# Patient Record
Sex: Male | Born: 1955 | Race: Black or African American | Marital: Married | Smoking: Never smoker
Health system: Southern US, Community
[De-identification: ages and names within clinical notes are randomized; demographics above are authoritative.]

## PROBLEM LIST (undated history)

## (undated) DIAGNOSIS — B0230 Zoster ocular disease, unspecified: Secondary | ICD-10-CM

## (undated) HISTORY — PX: CORONARY ANGIOPLASTY: SHX604

## (undated) HISTORY — PX: ARTERIOVENOUS GRAFT PLACEMENT: SUR1029

---

## 2020-05-19 ENCOUNTER — Inpatient Hospital Stay (HOSPITAL_COMMUNITY)
Admission: AD | Admit: 2020-05-19 | Discharge: 2020-05-30 | DRG: 871 | Disposition: A | Discharge status: Home / Self Care | Payer: Medicare Other | Source: Other Acute Inpatient Hospital | Attending: Internal Medicine | Admitting: Internal Medicine

## 2020-05-19 ENCOUNTER — Other Ambulatory Visit: Payer: Self-pay

## 2020-05-19 ENCOUNTER — Inpatient Hospital Stay (HOSPITAL_COMMUNITY): Payer: Medicare Other

## 2020-05-19 ENCOUNTER — Encounter (HOSPITAL_COMMUNITY): Payer: Self-pay | Admitting: Family Medicine

## 2020-05-19 DIAGNOSIS — J90 Pleural effusion, not elsewhere classified: Secondary | ICD-10-CM

## 2020-05-19 DIAGNOSIS — E1165 Type 2 diabetes mellitus with hyperglycemia: Secondary | ICD-10-CM | POA: Diagnosis not present

## 2020-05-19 DIAGNOSIS — I5083 High output heart failure: Secondary | ICD-10-CM | POA: Diagnosis present

## 2020-05-19 DIAGNOSIS — D62 Acute posthemorrhagic anemia: Secondary | ICD-10-CM | POA: Diagnosis present

## 2020-05-19 DIAGNOSIS — J449 Chronic obstructive pulmonary disease, unspecified: Secondary | ICD-10-CM | POA: Diagnosis not present

## 2020-05-19 DIAGNOSIS — R0602 Shortness of breath: Secondary | ICD-10-CM | POA: Diagnosis present

## 2020-05-19 DIAGNOSIS — Z992 Dependence on renal dialysis: Secondary | ICD-10-CM

## 2020-05-19 DIAGNOSIS — E1122 Type 2 diabetes mellitus with diabetic chronic kidney disease: Secondary | ICD-10-CM | POA: Diagnosis present

## 2020-05-19 DIAGNOSIS — R079 Chest pain, unspecified: Secondary | ICD-10-CM | POA: Diagnosis not present

## 2020-05-19 DIAGNOSIS — A419 Sepsis, unspecified organism: Secondary | ICD-10-CM | POA: Diagnosis present

## 2020-05-19 DIAGNOSIS — Z20822 Contact with and (suspected) exposure to covid-19: Secondary | ICD-10-CM | POA: Diagnosis present

## 2020-05-19 DIAGNOSIS — I2510 Atherosclerotic heart disease of native coronary artery without angina pectoris: Secondary | ICD-10-CM | POA: Diagnosis present

## 2020-05-19 DIAGNOSIS — J9601 Acute respiratory failure with hypoxia: Secondary | ICD-10-CM | POA: Diagnosis not present

## 2020-05-19 DIAGNOSIS — Z9981 Dependence on supplemental oxygen: Secondary | ICD-10-CM | POA: Diagnosis not present

## 2020-05-19 DIAGNOSIS — R042 Hemoptysis: Secondary | ICD-10-CM | POA: Diagnosis present

## 2020-05-19 DIAGNOSIS — R64 Cachexia: Secondary | ICD-10-CM | POA: Diagnosis present

## 2020-05-19 DIAGNOSIS — Z681 Body mass index (BMI) 19 or less, adult: Secondary | ICD-10-CM

## 2020-05-19 DIAGNOSIS — Z955 Presence of coronary angioplasty implant and graft: Secondary | ICD-10-CM

## 2020-05-19 DIAGNOSIS — J189 Pneumonia, unspecified organism: Secondary | ICD-10-CM

## 2020-05-19 DIAGNOSIS — I132 Hypertensive heart and chronic kidney disease with heart failure and with stage 5 chronic kidney disease, or end stage renal disease: Secondary | ICD-10-CM | POA: Diagnosis present

## 2020-05-19 DIAGNOSIS — I5022 Chronic systolic (congestive) heart failure: Secondary | ICD-10-CM | POA: Diagnosis present

## 2020-05-19 DIAGNOSIS — Z79899 Other long term (current) drug therapy: Secondary | ICD-10-CM

## 2020-05-19 DIAGNOSIS — Z885 Allergy status to narcotic agent status: Secondary | ICD-10-CM

## 2020-05-19 DIAGNOSIS — I25119 Atherosclerotic heart disease of native coronary artery with unspecified angina pectoris: Secondary | ICD-10-CM | POA: Diagnosis not present

## 2020-05-19 DIAGNOSIS — D649 Anemia, unspecified: Secondary | ICD-10-CM | POA: Diagnosis not present

## 2020-05-19 DIAGNOSIS — J440 Chronic obstructive pulmonary disease with acute lower respiratory infection: Secondary | ICD-10-CM | POA: Diagnosis present

## 2020-05-19 DIAGNOSIS — E43 Unspecified severe protein-calorie malnutrition: Secondary | ICD-10-CM | POA: Diagnosis present

## 2020-05-19 DIAGNOSIS — M069 Rheumatoid arthritis, unspecified: Secondary | ICD-10-CM | POA: Diagnosis present

## 2020-05-19 DIAGNOSIS — N186 End stage renal disease: Secondary | ICD-10-CM | POA: Diagnosis present

## 2020-05-19 DIAGNOSIS — D631 Anemia in chronic kidney disease: Secondary | ICD-10-CM | POA: Diagnosis present

## 2020-05-19 DIAGNOSIS — T380X5A Adverse effect of glucocorticoids and synthetic analogues, initial encounter: Secondary | ICD-10-CM | POA: Diagnosis not present

## 2020-05-19 DIAGNOSIS — J9621 Acute and chronic respiratory failure with hypoxia: Secondary | ICD-10-CM | POA: Diagnosis present

## 2020-05-19 DIAGNOSIS — B0230 Zoster ocular disease, unspecified: Secondary | ICD-10-CM | POA: Diagnosis present

## 2020-05-19 DIAGNOSIS — Z882 Allergy status to sulfonamides status: Secondary | ICD-10-CM

## 2020-05-19 DIAGNOSIS — Y95 Nosocomial condition: Secondary | ICD-10-CM | POA: Diagnosis not present

## 2020-05-19 DIAGNOSIS — R778 Other specified abnormalities of plasma proteins: Secondary | ICD-10-CM | POA: Diagnosis not present

## 2020-05-19 DIAGNOSIS — Z7982 Long term (current) use of aspirin: Secondary | ICD-10-CM

## 2020-05-19 DIAGNOSIS — G8929 Other chronic pain: Secondary | ICD-10-CM | POA: Diagnosis present

## 2020-05-19 DIAGNOSIS — R7989 Other specified abnormal findings of blood chemistry: Secondary | ICD-10-CM | POA: Diagnosis present

## 2020-05-19 DIAGNOSIS — Z23 Encounter for immunization: Secondary | ICD-10-CM | POA: Diagnosis present

## 2020-05-19 DIAGNOSIS — M898X9 Other specified disorders of bone, unspecified site: Secondary | ICD-10-CM | POA: Diagnosis present

## 2020-05-19 DIAGNOSIS — E876 Hypokalemia: Secondary | ICD-10-CM | POA: Diagnosis present

## 2020-05-19 DIAGNOSIS — Z7902 Long term (current) use of antithrombotics/antiplatelets: Secondary | ICD-10-CM

## 2020-05-19 HISTORY — DX: Chronic obstructive pulmonary disease, unspecified: J44.9

## 2020-05-19 HISTORY — DX: End stage renal disease: N18.6

## 2020-05-19 HISTORY — DX: Atherosclerotic heart disease of native coronary artery without angina pectoris: I25.10

## 2020-05-19 HISTORY — DX: Zoster ocular disease, unspecified: B02.30

## 2020-05-19 LAB — BASIC METABOLIC PANEL
Anion gap: 12 (ref 5–15)
BUN: 33 mg/dL — ABNORMAL HIGH (ref 8–23)
CO2: 31 mmol/L (ref 22–32)
Calcium: 8.1 mg/dL — ABNORMAL LOW (ref 8.9–10.3)
Chloride: 95 mmol/L — ABNORMAL LOW (ref 98–111)
Creatinine, Ser: 4.15 mg/dL — ABNORMAL HIGH (ref 0.61–1.24)
GFR, Estimated: 15 mL/min — ABNORMAL LOW (ref >60)
Glucose, Bld: 113 mg/dL — ABNORMAL HIGH (ref 70–99)
Potassium: 3.1 mmol/L — ABNORMAL LOW (ref 3.5–5.1)
Sodium: 138 mmol/L (ref 135–145)

## 2020-05-19 LAB — COMPREHENSIVE METABOLIC PANEL
ALT: 29 U/L (ref 0–44)
AST: 47 U/L — ABNORMAL HIGH (ref 15–41)
Albumin: 2.7 g/dL — ABNORMAL LOW (ref 3.5–5.0)
Alkaline Phosphatase: 121 U/L (ref 38–126)
Anion gap: 17 — ABNORMAL HIGH (ref 5–15)
BUN: 31 mg/dL — ABNORMAL HIGH (ref 8–23)
CO2: 26 mmol/L (ref 22–32)
Calcium: 7.9 mg/dL — ABNORMAL LOW (ref 8.9–10.3)
Chloride: 95 mmol/L — ABNORMAL LOW (ref 98–111)
Creatinine, Ser: 4.06 mg/dL — ABNORMAL HIGH (ref 0.61–1.24)
GFR, Estimated: 16 mL/min — ABNORMAL LOW (ref >60)
Glucose, Bld: 111 mg/dL — ABNORMAL HIGH (ref 70–99)
Potassium: 3.1 mmol/L — ABNORMAL LOW (ref 3.5–5.1)
Sodium: 138 mmol/L (ref 135–145)
Total Bilirubin: 0.9 mg/dL (ref 0.3–1.2)
Total Protein: 6.8 g/dL (ref 6.5–8.1)

## 2020-05-19 LAB — TROPONIN I (HIGH SENSITIVITY)
Troponin I (High Sensitivity): 160 ng/L (ref <18)
Troponin I (High Sensitivity): 174 ng/L (ref <18)
Troponin I (High Sensitivity): 188 ng/L (ref <18)

## 2020-05-19 LAB — PREPARE RBC (CROSSMATCH)

## 2020-05-19 LAB — URINALYSIS, ROUTINE W REFLEX MICROSCOPIC
Bilirubin Urine: NEGATIVE
Glucose, UA: 50 mg/dL — AB
Hgb urine dipstick: NEGATIVE
Ketones, ur: 5 mg/dL — AB
Leukocytes,Ua: NEGATIVE
Nitrite: NEGATIVE
Protein, ur: >=300 mg/dL — AB
Specific Gravity, Urine: 1.02 (ref 1.005–1.030)
pH: 6 (ref 5.0–8.0)

## 2020-05-19 LAB — CBC WITH DIFFERENTIAL/PLATELET
Abs Immature Granulocytes: 0.02 10*3/uL (ref 0.00–0.07)
Basophils Absolute: 0.1 10*3/uL (ref 0.0–0.1)
Basophils Relative: 1 %
Eosinophils Absolute: 0.2 10*3/uL (ref 0.0–0.5)
Eosinophils Relative: 2 %
HCT: 24.3 % — ABNORMAL LOW (ref 39.0–52.0)
Hemoglobin: 7.8 g/dL — ABNORMAL LOW (ref 13.0–17.0)
Immature Granulocytes: 0 %
Lymphocytes Relative: 11 %
Lymphs Abs: 0.8 10*3/uL (ref 0.7–4.0)
MCH: 29.4 pg (ref 26.0–34.0)
MCHC: 32.1 g/dL (ref 30.0–36.0)
MCV: 91.7 fL (ref 80.0–100.0)
Monocytes Absolute: 0.4 10*3/uL (ref 0.1–1.0)
Monocytes Relative: 5 %
Neutro Abs: 6.3 10*3/uL (ref 1.7–7.7)
Neutrophils Relative %: 81 %
Platelets: 212 10*3/uL (ref 150–400)
RBC: 2.65 MIL/uL — ABNORMAL LOW (ref 4.22–5.81)
RDW: 18.1 % — ABNORMAL HIGH (ref 11.5–15.5)
WBC: 7.7 10*3/uL (ref 4.0–10.5)
nRBC: 0.3 % — ABNORMAL HIGH (ref 0.0–0.2)

## 2020-05-19 LAB — EXPECTORATED SPUTUM ASSESSMENT W GRAM STAIN, RFLX TO RESP C

## 2020-05-19 LAB — TYPE AND SCREEN
ABO/RH(D): A POS
Antibody Screen: NEGATIVE

## 2020-05-19 LAB — CBC
HCT: 18.5 % — ABNORMAL LOW (ref 39.0–52.0)
Hemoglobin: 5.5 g/dL — CL (ref 13.0–17.0)
MCH: 28.2 pg (ref 26.0–34.0)
MCHC: 29.7 g/dL — ABNORMAL LOW (ref 30.0–36.0)
MCV: 94.9 fL (ref 80.0–100.0)
Platelets: 196 10*3/uL (ref 150–400)
RBC: 1.95 MIL/uL — ABNORMAL LOW (ref 4.22–5.81)
RDW: 19.2 % — ABNORMAL HIGH (ref 11.5–15.5)
WBC: 9.6 10*3/uL (ref 4.0–10.5)
nRBC: 0 % (ref 0.0–0.2)

## 2020-05-19 LAB — PROCALCITONIN
Procalcitonin: 23.01 ng/mL
Procalcitonin: 23.33 ng/mL

## 2020-05-19 LAB — VITAMIN B12: Vitamin B-12: 1242 pg/mL — ABNORMAL HIGH (ref 180–914)

## 2020-05-19 LAB — FOLATE: Folate: 20 ng/mL (ref >5.9)

## 2020-05-19 LAB — RETICULOCYTES
Immature Retic Fract: 25.9 % — ABNORMAL HIGH (ref 2.3–15.9)
RBC.: 2.99 MIL/uL — ABNORMAL LOW (ref 4.22–5.81)
Retic Count, Absolute: 159.1 10*3/uL (ref 19.0–186.0)
Retic Ct Pct: 5.3 % — ABNORMAL HIGH (ref 0.4–3.1)

## 2020-05-19 LAB — ABO/RH: ABO/RH(D): A POS

## 2020-05-19 LAB — D-DIMER, QUANTITATIVE: D-Dimer, Quant: 1.42 ug/mL-FEU — ABNORMAL HIGH (ref 0.00–0.50)

## 2020-05-19 LAB — FERRITIN: Ferritin: >7500 ng/mL — ABNORMAL HIGH (ref 24–336)

## 2020-05-19 LAB — MRSA PCR SCREENING: MRSA by PCR: NEGATIVE

## 2020-05-19 LAB — IRON AND TIBC
Iron: 34 ug/dL — ABNORMAL LOW (ref 45–182)
Saturation Ratios: 21 % (ref 17.9–39.5)
TIBC: 166 ug/dL — ABNORMAL LOW (ref 250–450)
UIBC: 132 ug/dL

## 2020-05-19 LAB — LACTIC ACID, PLASMA: Lactic Acid, Venous: 1.4 mmol/L (ref 0.5–1.9)

## 2020-05-19 LAB — HIV ANTIBODY (ROUTINE TESTING W REFLEX): HIV Screen 4th Generation wRfx: NONREACTIVE

## 2020-05-19 LAB — SARS CORONAVIRUS 2 (TAT 6-24 HRS): SARS Coronavirus 2: NEGATIVE

## 2020-05-19 LAB — MAGNESIUM: Magnesium: 1.9 mg/dL (ref 1.7–2.4)

## 2020-05-19 LAB — STREP PNEUMONIAE URINARY ANTIGEN: Strep Pneumo Urinary Antigen: NEGATIVE

## 2020-05-19 MED ORDER — FENTANYL CITRATE (PF) 100 MCG/2ML IJ SOLN: 12.5000 ug | INTRAMUSCULAR | Status: DC | PRN

## 2020-05-19 MED ORDER — CINACALCET HCL 30 MG PO TABS
60.0000 mg | ORAL_TABLET | ORAL | Status: DC
  Administered 2020-05-22 – 2020-05-29 (×4): 60 mg via ORAL
  Filled 2020-05-19 (×5): qty 2

## 2020-05-19 MED ORDER — ACETAMINOPHEN 650 MG RE SUPP: 650.0000 mg | Freq: Four times a day (QID) | RECTAL | Status: DC | PRN

## 2020-05-19 MED ORDER — FUROSEMIDE 10 MG/ML IJ SOLN
20.0000 mg | Freq: Once | INTRAMUSCULAR | Status: AC
  Administered 2020-05-19: 20 mg via INTRAVENOUS
  Filled 2020-05-19: qty 2

## 2020-05-19 MED ORDER — OXYCODONE-ACETAMINOPHEN 5-325 MG PO TABS
1.0000 | ORAL_TABLET | ORAL | Status: DC | PRN
  Administered 2020-05-19 – 2020-05-29 (×5): 1 via ORAL
  Filled 2020-05-19 (×5): qty 1

## 2020-05-19 MED ORDER — GUAIFENESIN-DM 100-10 MG/5ML PO SYRP
10.0000 mL | ORAL_SOLUTION | Freq: Four times a day (QID) | ORAL | Status: DC | PRN
  Administered 2020-05-19 – 2020-05-30 (×18): 10 mL via ORAL
  Filled 2020-05-19 (×18): qty 10

## 2020-05-19 MED ORDER — VANCOMYCIN HCL 1250 MG/250ML IV SOLN
1250.0000 mg | Freq: Once | INTRAVENOUS | Status: AC
  Administered 2020-05-19: 1250 mg via INTRAVENOUS
  Filled 2020-05-19: qty 250

## 2020-05-19 MED ORDER — MOMETASONE FURO-FORMOTEROL FUM 200-5 MCG/ACT IN AERO
2.0000 | INHALATION_SPRAY | Freq: Two times a day (BID) | RESPIRATORY_TRACT | Status: DC
  Administered 2020-05-19 – 2020-05-30 (×14): 2 via RESPIRATORY_TRACT
  Filled 2020-05-19 (×4): qty 8.8

## 2020-05-19 MED ORDER — SODIUM CHLORIDE 0.9% IV SOLUTION: Freq: Once | INTRAVENOUS | Status: DC

## 2020-05-19 MED ORDER — DOXERCALCIFEROL 4 MCG/2ML IV SOLN
2.0000 ug | INTRAVENOUS | Status: DC
  Administered 2020-05-20 – 2020-05-29 (×4): 2 ug via INTRAVENOUS
  Filled 2020-05-19 (×4): qty 2

## 2020-05-19 MED ORDER — FUROSEMIDE 10 MG/ML IJ SOLN
40.0000 mg | Freq: Once | INTRAMUSCULAR | Status: AC
  Administered 2020-05-19: 40 mg via INTRAVENOUS
  Filled 2020-05-19: qty 4

## 2020-05-19 MED ORDER — CHLORHEXIDINE GLUCONATE CLOTH 2 % EX PADS
6.0000 | MEDICATED_PAD | Freq: Every day | CUTANEOUS | Status: DC
  Administered 2020-05-20 – 2020-05-22 (×3): 6 via TOPICAL

## 2020-05-19 MED ORDER — ONDANSETRON HCL 4 MG/2ML IJ SOLN: 4.0000 mg | Freq: Four times a day (QID) | INTRAMUSCULAR | Status: DC | PRN

## 2020-05-19 MED ORDER — SODIUM CHLORIDE 0.9 % IV SOLN
2.0000 g | Freq: Once | INTRAVENOUS | Status: AC
  Administered 2020-05-19: 2 g via INTRAVENOUS
  Filled 2020-05-19: qty 2

## 2020-05-19 MED ORDER — ONDANSETRON HCL 4 MG PO TABS: 4.0000 mg | ORAL_TABLET | Freq: Four times a day (QID) | ORAL | Status: DC | PRN

## 2020-05-19 MED ORDER — TECHNETIUM TO 99M ALBUMIN AGGREGATED
4.4000 | Freq: Once | INTRAVENOUS | Status: AC
  Administered 2020-05-19: 4.4 via INTRAVENOUS

## 2020-05-19 MED ORDER — DARBEPOETIN ALFA 200 MCG/0.4ML IJ SOSY
200.0000 ug | PREFILLED_SYRINGE | INTRAMUSCULAR | Status: DC
  Administered 2020-05-20 – 2020-05-27 (×2): 200 ug via INTRAVENOUS
  Filled 2020-05-19: qty 0.4

## 2020-05-19 MED ORDER — SODIUM CHLORIDE 0.9 % IV SOLN
250.0000 mg | INTRAVENOUS | Status: AC
  Administered 2020-05-19 – 2020-05-22 (×4): 250 mg via INTRAVENOUS
  Filled 2020-05-19 (×5): qty 20

## 2020-05-19 MED ORDER — FUROSEMIDE 10 MG/ML IJ SOLN: 20.0000 mg | Freq: Once | INTRAMUSCULAR | Status: DC

## 2020-05-19 MED ORDER — ALBUTEROL SULFATE HFA 108 (90 BASE) MCG/ACT IN AERS
2.0000 | INHALATION_SPRAY | RESPIRATORY_TRACT | Status: DC | PRN
  Administered 2020-05-19 – 2020-05-20 (×2): 2 via RESPIRATORY_TRACT
  Filled 2020-05-19: qty 6.7

## 2020-05-19 MED ORDER — SODIUM CHLORIDE 0.9% IV SOLUTION: Freq: Once | INTRAVENOUS | Status: AC

## 2020-05-19 MED ORDER — SODIUM CHLORIDE 0.9 % IV SOLN
250.0000 mL | INTRAVENOUS | Status: DC | PRN
  Administered 2020-05-20 – 2020-05-22 (×2): 250 mL via INTRAVENOUS

## 2020-05-19 MED ORDER — TRAZODONE HCL 50 MG PO TABS
50.0000 mg | ORAL_TABLET | Freq: Every evening | ORAL | Status: DC | PRN
  Administered 2020-05-19 – 2020-05-27 (×7): 50 mg via ORAL
  Filled 2020-05-19 (×8): qty 1

## 2020-05-19 MED ORDER — ACETAMINOPHEN 325 MG PO TABS: 650.0000 mg | ORAL_TABLET | Freq: Four times a day (QID) | ORAL | Status: DC | PRN

## 2020-05-19 MED ORDER — VANCOMYCIN HCL 1000 MG/200ML IV SOLN: 1000.0000 mg | Freq: Once | INTRAVENOUS | Status: DC

## 2020-05-19 NOTE — Progress Notes (Signed)
Critical value 188 troponin called at received 1856 per call. Critical value paged to Dr. Tonie Griffith at [phone].

## 2020-05-19 NOTE — Progress Notes (Incomplete Revision)
PROGRESS NOTE   Brandon Chaney  XNA:[id number] DOB: Oct 13, 1955 DOA: 05/19/2020 PCP: No primary care provider on file.  Brief Narrative:  65 year old BM ESRD on HD-in [place]-proximal right upper extremity graft underwent hemodialysis in [place] yesterday 2 L for 3 and half hours usually has 1 L drawn off of him Dry weight is 61 kg COPD CAD + stents 02/2020 EF dropped from 45% to 25% recently Herpes zoster ophthalmicus Admit several weeks ago for 6 days at [facility] for -was discharged after getting 3 units of blood Then went to [facility] 3 days later and stayed there for 3 weeks with cough bloody expectoration despite antibiotic use Had pneumonia and PNA discharged on baseline 3 L  Had hemodialysis yesterday.  Posttreatment Felt very fatigued at the end of it Does not recall having endoscopy or colonoscopy at either hospital Does not report dark tarry stools bloody emesis etc. Bolused 500 cc IV fluid in ED--I/O today +627  Labs showed BUN/creatinine 33/4.1 Potassium 3.1 Iron 34 saturation was 21 ferritin above 7500 Procalcitonin 23  Hemoglobin 6.2 Patient developed shortness of breath after transfusion Which resolved significantly after administration of one-time dose Lasix  Hospital-Problem based course  Dyspnea DDx pneumonia/high output heart failure/moderate pleural effusion Recently discharged from [place] fear with Citrobacter and given 1 month of antibiotics projected end date at the time of that hospitalization was 05/04/2020 Continue cefepime vancomycin (procalcitonin 23) but do not give fluids given effusion at this time Had to stop transfusion as he was feeling winded Given Lasix X1 40 mg and improved significantly Patient stable on reassessment at 16:30 pm Pleural effusion on 1 view Chest x-ray 3/1 = moderate pleural effusion Placed order for IR --is transferring to [facility] IR thoracentesis-- Hemoptysis Underwent bronchoscopy at  [place] fear BAL culture = Citrobacter --Cont Cefdinir negative for malignancy negative for autoimmune disease-etiology felt to be?  DOAC Acute anemia likely combination of renal disease, unclear blood loss  Hemoccult stool Giving half a bag of blood given shortness of breath earlier today ESRD MWF right upper extremity [place] Dry weight 61, recent dialysis 2 L 2/28 Defer to nephrology rest CAD status post stent 02/2020 drop in EF from 45 to 25% Will need nonemergent cardiology evaluation for feasibility of intervention although probably need to be more stable prior to that Pls call cardiology in am 3/2 post transfusion and thoracentesis Chronic pain Home meds Percocet 1 tab every 4 as needed COPD Herpes ophthalmicus  DVT prophylaxis: scd Code Status: FULL Family Communication: wife fully updated at the bedside-phone [phone] I have explained to both of them in no uncertain terms that he is relatively high risk for getting sicker-explained that he will need a thoracentesis and that he is probably not a great candidate for any type of cardiac intervention at this time until we can get him better They understand all this-he remains full code Disposition:  Status is: Inpatient  Remains inpatient appropriate because:Hemodynamically unstable, Altered mental status, Ongoing diagnostic testing needed not appropriate for outpatient work up, Unsafe d/c plan, IV treatments appropriate due to intensity of illness or inability to take PO and Inpatient level of care appropriate due to severity of illness   Dispo: The patient is from: Home              Anticipated d/c is to: tbd              Patient currently is not medically stable to d/c.   Difficult  to place patient No  Consultants:   Nephrology   IR  Cardiology  Procedures: IR thoracentesis  Antimicrobials: Vanc Cefepime    Subjective: Some Mild CP after transufion earlier today relieved with Lasix CXR showed effusion on  R side He was reviewed again 4:14 PM and is stable without chest pain arm pain or any other issues at this time Reiterated with him/wife next steps     Objective: Vitals:   05/19/20 0923 05/19/20 0942 05/19/20 1015 05/19/20 1315  BP: (!) 123/93 (!) 124/41 (!) 127/38 (!) 122/58  Pulse: 80 81  82  Resp: 18 20  (!) 22  Temp: 98.3 F (36.8 C) 98.4 F (36.9 C)  98.1 F (36.7 C)  TempSrc: Oral Oral  Oral  SpO2:      Weight:      Height:        Intake/Output Summary (Last 24 hours) at 05/19/2020 1414 Last data filed at 05/19/2020 1305 Gross per 24 hour  Intake 763.76 ml  Output -  Net 763.76 ml   Filed Weights   05/19/20 0145  Weight: 61.9 kg    Examination:  Awake coherent pleasant on 2 L of oxygen Quite asthenic S1-S2 no murmur no rub no gallop Abdomen soft no murmur EOMI NCAT no focal deficit Neurologically intact TMA left foot, right upper arm AV graft good bruit  Data Reviewed: personally reviewed   CBC    Component Value Date/Time   WBC 9.6 05/19/2020 0510   RBC 2.99 (L) 05/19/2020 0517   RBC 1.95 (L) 05/19/2020 0510   HGB 5.5 (LL) 05/19/2020 0510   HCT 18.5 (L) 05/19/2020 0510   PLT 196 05/19/2020 0510   MCV 94.9 05/19/2020 0510   MCH 28.2 05/19/2020 0510   MCHC 29.7 (L) 05/19/2020 0510   RDW 19.2 (H) 05/19/2020 0510   CMP Latest Ref Rng & Units 05/19/2020 05/19/2020  Glucose 70 - 99 mg/dL 113(H) 111(H)  BUN 8 - 23 mg/dL 33(H) 31(H)  Creatinine 0.61 - 1.24 mg/dL 4.15(H) 4.06(H)  Sodium 135 - 145 mmol/L 138 138  Potassium 3.5 - 5.1 mmol/L 3.1(L) 3.1(L)  Chloride 98 - 111 mmol/L 95(L) 95(L)  CO2 22 - 32 mmol/L 31 26  Calcium 8.9 - 10.3 mg/dL 8.1(L) 7.9(L)  Total Protein 6.5 - 8.1 g/dL - 6.8  Total Bilirubin 0.3 - 1.2 mg/dL - 0.9  Alkaline Phos 38 - 126 U/L - 121  AST 15 - 41 U/L - 47(H)  ALT 0 - 44 U/L - 29     Radiology Studies: DG CHEST PORT 1 VIEW  Result Date: 05/19/2020 CLINICAL DATA:  Pneumonia. EXAM: PORTABLE CHEST 1 VIEW COMPARISON:   One-view chest x-ray 05/19/2020 FINDINGS: Heart is enlarged. Atherosclerotic calcifications are present. Diffuse interstitial pattern is again seen. Moderate right pleural effusion present. No pneumothorax. Bibasilar airspace disease is worse right than left. IMPRESSION: 1. Cardiomegaly with diffuse interstitial pattern compatible with edema. Infection is not excluded. Patchy opacities seen on the prior study are less prominent. Pattern is more diffuse. 2. Moderate right pleural effusion. 3. Bibasilar airspace disease is worse on the right than left. Electronically Signed   By: San Morelle M.D.   On: 05/19/2020 10:47   DG CHEST PORT 1 VIEW  Result Date: 05/19/2020 CLINICAL DATA:  Sepsis, pneumonia, hemoptysis EXAM: PORTABLE CHEST 1 VIEW COMPARISON:  Portable exam 0515 hours without priors for comparison FINDINGS: Normal heart size and mediastinal contours. BILATERAL pulmonary infiltrates favor multifocal pneumonia, less likely edema Probable small RIGHT  pleural effusion. No pneumothorax or acute osseous findings. Vascular stents at the axilla bilaterally. IMPRESSION: Diffuse BILATERAL pulmonary infiltrates favoring multifocal pneumonia. Electronically Signed   By: Lavonia Dana M.D.   On: 05/19/2020 08:22     Scheduled Meds: . mometasone-formoterol  2 puff Inhalation BID   Continuous Infusions: . sodium chloride       LOS: 0 days   Time spent: 95 minutes 60 in direct care time and care coordination  Nita Sells, MD [facility] To contact the attending provider between 7A-7P or the covering provider during after hours 7P-7A, please log into the web site [email or web address] and access using universal [place] password for that web site. If you do not have the password, please call the hospital operator.  05/19/2020, 2:14 PM

## 2020-05-19 NOTE — H&P (Signed)
History and Physical    Brandon Chaney [id number] DOB: 1955-07-20 DOA: 05/19/2020  PCP: No primary care provider on file.   Patient coming from: Home   Chief Complaint: Chest pain, SOB, cough   HPI: Brandon Chaney is a 65 y.o. male with medical history significant for ESRD on hemodialysis, COPD, coronary artery disease with stents, history of zoster ophthalmicus, recent hospital admission for pneumonia and symptomatic anemia, and supplemental oxygen requirement since the recent hospitalization, representing to the ED for evaluation of chest pain, shortness of breath, and persistent cough.  Patient reports that he was admitted to [facility] roughly 2 weeks ago, treated for pneumonia, transfused 3 units of RBC, and went home with supplemental oxygen.  He continued to feel poorly, has had ongoing productive cough with sputum and small blood clots since prior to the hospitalization, and then developed chest pain and worsening shortness of breath and general malaise and fatigue over the past 2 days.  He has not noted any melena or hematochezia.  He is not sure why he required the transfusion.  Reports that he completed dialysis on 05/18/2020.  [facility] ED Course: Upon arrival to the ED, patient is found to be febrile 102.8 F, saturating 87% on 2 L/min of supplemental oxygen, tachypneic in the mid 20s, and with blood pressure 93/40.  EKG features sinus tachycardia with nonspecific IVCD and borderline ST depressions in the lateral leads.  Chest x-ray was read as bilateral upper lobe infiltrates with right greater than left effusions.  Chemistry panel features a potassium of 3.1, bicarbonate 25, and creatinine 3.20.  CBC notable for leukocytosis to 11,000 and a normocytic anemia with hemoglobin 6.2.  Lactic acid is 2.2 and procalcitonin 1.49.  Hs-CRP elevated 49.88 and high-sensitivity troponin 113.  INR was 1.3.  Covid and influenza PCR was negative.  Blood cultures were collected in the  emergency department and the patient was treated with 500 cc LR, aspirin, Rocephin, acetaminophen, and vancomycin.  He was transferred to [facility] for [facility].  Review of Systems:  All other systems reviewed and apart from HPI, are negative.  Past Medical History:  Diagnosis Date  . CAD (coronary artery disease) 05/19/2020  . COPD (chronic obstructive pulmonary disease) ([place]) 05/19/2020  . ESRD (end stage renal disease) ([place]) 05/19/2020  . Herpes zoster ophthalmicus, right eye     Past Surgical History:  Procedure Laterality Date  . ARTERIOVENOUS GRAFT PLACEMENT Right   . CORONARY ANGIOPLASTY      Social History:   reports that he has never smoked. He has never used smokeless tobacco. He reports previous alcohol use. He reports previous drug use.  Not on File  Family History  Family history unknown: Yes     Prior to Admission medications   Not on File    Physical Exam: Vitals:   05/19/20 0145  BP: (!) 112/32  Pulse: 79  Resp: 20  Temp: 98.1 F (36.7 C)  TempSrc: Oral  SpO2: 90%  Weight: 61.9 kg  Height: '5\' 9"'$  (1.753 m)    Constitutional: NAD, calm, cachectic   Eyes: PERTLA, lids and conjunctivae normal ENMT: Mucous membranes are moist. Posterior pharynx clear of any exudate or lesions.   Neck: normal, supple, no masses, no thyromegaly Respiratory: coarse rales, no wheezing. No accessory muscle use.  Cardiovascular: S1 & S2 heard, regular rate and rhythm. No extremity edema. Graft in proximal RUE.  Abdomen: No distension, no tenderness, soft. Bowel sounds active.  Musculoskeletal: no clubbing /  cyanosis. No joint deformity upper and lower extremities.   Skin: no significant rashes, lesions, ulcers. Warm, dry, well-perfused. Neurologic: No gross facial asymmetry. Sensation intact. Moving all extremities.  Psychiatric: Alert and oriented to person, place, and situation. Calm and cooperative.    Labs and Imaging on Admission: I have personally reviewed  following labs and imaging studies  CBC: No results for input(s): WBC, NEUTROABS, HGB, HCT, MCV, PLT in the last 168 hours. Basic Metabolic Panel: No results for input(s): NA, K, CL, CO2, GLUCOSE, BUN, CREATININE, CALCIUM, MG, PHOS in the last 168 hours. GFR: CrCl cannot be calculated (No successful lab value found.). Liver Function Tests: No results for input(s): AST, ALT, ALKPHOS, BILITOT, PROT, ALBUMIN in the last 168 hours. No results for input(s): LIPASE, AMYLASE in the last 168 hours. No results for input(s): AMMONIA in the last 168 hours. Coagulation Profile: No results for input(s): INR, PROTIME in the last 168 hours. Cardiac Enzymes: No results for input(s): CKTOTAL, CKMB, CKMBINDEX, TROPONINI in the last 168 hours. BNP (last 3 results) No results for input(s): PROBNP in the last 8760 hours. HbA1C: No results for input(s): HGBA1C in the last 72 hours. CBG: No results for input(s): GLUCAP in the last 168 hours. Lipid Profile: No results for input(s): CHOL, HDL, LDLCALC, TRIG, CHOLHDL, LDLDIRECT in the last 72 hours. Thyroid Function Tests: No results for input(s): TSH, T4TOTAL, FREET4, T3FREE, THYROIDAB in the last 72 hours. Anemia Panel: No results for input(s): VITAMINB12, FOLATE, FERRITIN, TIBC, IRON, RETICCTPCT in the last 72 hours. Urine analysis: No results found for: COLORURINE, APPEARANCEUR, LABSPEC, PHURINE, GLUCOSEU, HGBUR, BILIRUBINUR, KETONESUR, PROTEINUR, UROBILINOGEN, NITRITE, LEUKOCYTESUR Sepsis Labs: '@LABRCNTIP'$ (procalcitonin:4,lacticidven:4) )No results found for this or any previous visit (from the past 240 hour(s)).   Radiological Exams on Admission: No results found.  EKG: Independently reviewed. Sinus tachycardia, rate 100, non-specific IVCD, borderline ST-depressions laterally.   Assessment/Plan   1. Sepsis due to PNA; acute on chronic hypoxic respiratory failure  - Presents with CP, worsening SOB, and persistent productive cough after admission  for PNA ~2 weeks ago and is found to be febrile, tachypneic, and saturating 87% on 2 Lpm, with lactic acid 2.2 and procalcitonin 1.49  - Unable to view CXR from ED but read as bilateral upper lobe infiltrates and right > left effusions  - He was cultured and treated with Rocephin and vancomycin in ED  - Obtain records from recent admission, culture blood and sputum, check strep pneumo and legionella antigens, trend procalcitonin, and continue broad-spectrum antibiotics   2. Symptomatic anemia  - Presents with SOB and chest pain and is found to have Hgb of 6.2 with no prior labs available for comparison  - He reports scant hemoptysis, has not noted melena or hematochezia, and reports that he was transfused with 3 units RBC during recent admission  - Obtain records from recent admission, type & screen, check anemia panel and FOBT, and transfuse 1 unit RBC    3. Chest pain; elevated troponin  - Patient reports constant chest pressure for the past 2 days and had HS troponin of 113 in ED  - He reports history of CAD with stents but has not had this type of pain before  - EKG with borderline ST-depressions in lateral leads  - He was treated with ASA in ED  - Check d-dimer given hemoptysis, transfuse RBCs, trend troponin, repeat EKG, continue cardiac monitoring, hold off on anticoagulation as this is likely type II event and he has hemoptysis and Hgb  6.2    4. Hemoptysis  - Patient reports a few weeks of scant hemoptysis  - Unable to view CXR from outside hospital and records from recent hospitalization not yet available  - Plan to obtain records, check d-dimer, and treat suspected pneumonia for now    5. ESRD  - Patient has graft in proximal RUE, reports receiving HD on 05/18/20  - No indications for urgent HD but he was given 500 cc LR in ED and will need blood transfusion  - Renally-dose medications, restrict fluids, contact nephrology in am for HD    6. COPD  - No wheezing on admission  -  ICS/LABA, as-needed albuterol    7. Hypokalemia  - Serum potassium was 3.1 at outside hospital  - Repeat chem panel pending now, will treat cautiously in light of ESRD     DVT prophylaxis: SCDs  Code Status: Full  Level of Care: Level of care: Progressive Family Communication: Patient asked that we not wake his wife now  Disposition Plan:  Patient is from: Home   Anticipated d/c is to: TBD Anticipated d/c date is: 05/22/20 Patient currently: Requiring inpatient treatment of PNA with high PORT score, further workup of chest pain and hemoptysis  Consults called: None  Admission status: Inpatient     Vianne Bulls, MD [facility]  05/19/2020, 4:15 AM

## 2020-05-19 NOTE — Plan of Care (Signed)
  Problem: Education: Goal: Knowledge of General Education information will improve Description: Including pain rating scale, medication(s)/side effects and non-pharmacologic comfort measures Outcome: Progressing   Problem: Clinical Measurements: Goal: Diagnostic test results will improve Outcome: Progressing Goal: Respiratory complications will improve Outcome: Progressing   Problem: Activity: Goal: Risk for activity intolerance will decrease Outcome: Progressing   Problem: Nutrition: Goal: Adequate nutrition will be maintained Outcome: Progressing   Problem: Safety: Goal: Ability to remain free from injury will improve Outcome: Progressing   Problem: Fluid Volume: Goal: Compliance with measures to maintain balanced fluid volume will improve Outcome: Progressing

## 2020-05-19 NOTE — Hospital Course (Addendum)
  Impression:            - Hemoptysis with abnormal CXR                        - The airway examination was normal.                        - Bronchoalveolar lavage was performed.                        - The airway examination was normal.   Pt was seen for bedside swallow evaluation. Pt reported that he intermittently gets a "tickle" in his throat during meals which causes coughing. Pt further stated that he was to have his esophagus dilated ~6 years prior, but this was not done due to his medical status as the time. He was unsure as to whether he sensed aspiration but indicated that some foods "wont go down" and pointed the pharynx as the site of the sensation. Oral mechanism exam was [facility] and pt was edentulous. Pt's oral phase was WNL despite edentulous status. He exhibited delayed coughing between trials of regular texture solids. Coughing was also noted at baseline and the possibility of coughing being unrelated is considered. However, considering pt's reports, and the results of imaging, a modified barium swallow study is recommended to further assess physiology and to rule out aspiration.

## 2020-05-19 NOTE — Progress Notes (Signed)
Date and time results received: 05/19/20  0600H  Critical Value:Hemoglobin-5.5   Name of Provider Notified: Jonny Ruiz NP  Time Notified: [id number]  Orders Received? Or Actions Taken?: Paged/Made aware/Awaiting orders if any.

## 2020-05-19 NOTE — Plan of Care (Signed)
  Problem: Clinical Measurements: Goal: Complications related to the disease process, condition or treatment will be avoided or minimized Outcome: Progressing   Problem: Fluid Volume: Goal: Compliance with measures to maintain balanced fluid volume will improve Outcome: Progressing   Problem: Pain Managment: Goal: General experience of comfort will improve Outcome: Progressing   Problem: Activity: Goal: Risk for activity intolerance will decrease Outcome: Progressing

## 2020-05-19 NOTE — Progress Notes (Addendum)
PROGRESS NOTE   Brandon Chaney  SFK:[id number] DOB: 1955/06/07 DOA: 05/19/2020 PCP: No primary care provider on file.  Brief Narrative:  65 year old BM ESRD on HD-in [place]-proximal right upper extremity graft underwent hemodialysis in [place] yesterday 2 L for 3 and half hours usually has 1 L drawn off of him Dry weight is 61 kg COPD CAD + stents 02/2020 EF dropped from 45% to 25% recently Herpes zoster ophthalmicus Admit several weeks ago for 6 days at [facility] for -was discharged after getting 3 units of blood Then went to [facility] 3 days later and stayed there for 3 weeks with cough bloody expectoration despite antibiotic use Had pneumonia and PNA discharged on baseline 3 L  Had hemodialysis yesterday.  Posttreatment Felt very fatigued at the end of it Does not recall having endoscopy or colonoscopy at either hospital Does not report dark tarry stools bloody emesis etc. Bolused 500 cc IV fluid in ED--I/O today +627  Labs showed BUN/creatinine 33/4.1 Potassium 3.1 Iron 34 saturation was 21 ferritin above 7500 Procalcitonin 23  Hemoglobin 6.2 Patient developed shortness of breath after transfusion  I have tried X.3 to get records and nurse tech on 4 E has emailed [facility]-Problem based course  Dyspnea Multifactorial DDx pneumonia/high output heart failure Recently discharged from [place] [facility] with Citrobacter and given 1 month of antibiotics projected end date at the time of that hospitalization was 05/04/2020 Continue cefepime vancomycin (procalcitonin 23) but do not give fluids given effusion at this time Had to stop transfusion as he was feeling winded Given Lasix X1 40 mg and improved significantly Patient stable on reassessment at 16:30 pm Pleural effusion 1 view Chest x-ray 3/1 = moderate pleural effusion Placed order for IR and informed him of his transfer to [facility] thoracentesis-care order placed for nursing to call IR  at [place] if gets there first Hemoptysis Underwent bronchoscopy at [place] [facility] BAL culture = Citrobacter negative for malignancy negative for autoimmune disease-etiology felt to be?  DOAC Acute anemia likely combination of renal disease, unclear blood loss  Hemoccult stool Giving half a bag of blood given shortness of breath earlier today ESRD MWF right upper extremity [place] Dry weight 61, recent dialysis 2 L 2/28 Defer to nephrology rest CAD status post stent 02/2020 drop in EF from 45 to 25% Will need nonemergent cardiology evaluation for feasibility of intervention although probably need to be more stable prior to that Pls call cardiology in am 3/2 post transfusion and thoracentesis Chronic pain Home meds Percocet 1 tab every 4 as needed COPD Herpes ophthalmicus  DVT prophylaxis: scd Code Status: FULL Family Communication: wife fully updated at the bedside-phone [phone] I have explained to both of them in no uncertain terms that he is relatively high risk for getting sicker-explained that he will need a thoracentesis and that he is probably not a great candidate for any type of cardiac intervention at this time until we can get him better They understand all this-he remains full code Disposition:  Status is: Inpatient  Remains inpatient appropriate because:Hemodynamically unstable, Altered mental status, Ongoing diagnostic testing needed not appropriate for outpatient work up, Unsafe d/c plan, IV treatments appropriate due to intensity of illness or inability to take PO and Inpatient level of care appropriate due to severity of illness   Dispo: The patient is from: Home              Anticipated d/c is to: tbd  Patient currently is not medically stable to d/c.   Difficult to place patient No  Consultants:   Nephrology   IR  Cardiology  Procedures: IR thoracentesis  Antimicrobials: Vanc Cefepime    Subjective: Some Mild CP after transufion earlier  today relieved with Lasix CXR showed effusion on R side He was reviewed again 4:14 PM and is stable without chest pain arm pain or any other issues at this time Reiterated with him/wife next steps     Objective: Vitals:   05/19/20 0923 05/19/20 0942 05/19/20 1015 05/19/20 1315  BP: (!) 123/93 (!) 124/41 (!) 127/38 (!) 122/58  Pulse: 80 81  82  Resp: 18 20  (!) 22  Temp: 98.3 F (36.8 C) 98.4 F (36.9 C)  98.1 F (36.7 C)  TempSrc: Oral Oral  Oral  SpO2:      Weight:      Height:        Intake/Output Summary (Last 24 hours) at 05/19/2020 1414 Last data filed at 05/19/2020 1305 Gross per 24 hour  Intake 763.76 ml  Output --  Net 763.76 ml   Filed Weights   05/19/20 0145  Weight: 61.9 kg    Examination:  Awake coherent pleasant on 2 L of oxygen Quite asthenic S1-S2 no murmur no rub no gallop Abdomen soft no murmur EOMI NCAT no focal deficit Neurologically intact TMA left foot, right upper arm AV graft good bruit  Data Reviewed: personally reviewed   CBC    Component Value Date/Time   WBC 9.6 05/19/2020 0510   RBC 2.99 (L) 05/19/2020 0517   RBC 1.95 (L) 05/19/2020 0510   HGB 5.5 (LL) 05/19/2020 0510   HCT 18.5 (L) 05/19/2020 0510   PLT 196 05/19/2020 0510   MCV 94.9 05/19/2020 0510   MCH 28.2 05/19/2020 0510   MCHC 29.7 (L) 05/19/2020 0510   RDW 19.2 (H) 05/19/2020 0510   CMP Latest Ref Rng & Units 05/19/2020 05/19/2020  Glucose 70 - 99 mg/dL 113(H) 111(H)  BUN 8 - 23 mg/dL 33(H) 31(H)  Creatinine 0.61 - 1.24 mg/dL 4.15(H) 4.06(H)  Sodium 135 - 145 mmol/L 138 138  Potassium 3.5 - 5.1 mmol/L 3.1(L) 3.1(L)  Chloride 98 - 111 mmol/L 95(L) 95(L)  CO2 22 - 32 mmol/L 31 26  Calcium 8.9 - 10.3 mg/dL 8.1(L) 7.9(L)  Total Protein 6.5 - 8.1 g/dL - 6.8  Total Bilirubin 0.3 - 1.2 mg/dL - 0.9  Alkaline Phos 38 - 126 U/L - 121  AST 15 - 41 U/L - 47(H)  ALT 0 - 44 U/L - 29     Radiology Studies: DG CHEST PORT 1 VIEW  Result Date: 05/19/2020 CLINICAL DATA:   Pneumonia. EXAM: PORTABLE CHEST 1 VIEW COMPARISON:  One-view chest x-ray 05/19/2020 FINDINGS: Heart is enlarged. Atherosclerotic calcifications are present. Diffuse interstitial pattern is again seen. Moderate right pleural effusion present. No pneumothorax. Bibasilar airspace disease is worse right than left. IMPRESSION: 1. Cardiomegaly with diffuse interstitial pattern compatible with edema. Infection is not excluded. Patchy opacities seen on the prior study are less prominent. Pattern is more diffuse. 2. Moderate right pleural effusion. 3. Bibasilar airspace disease is worse on the right than left. Electronically Signed   By: San Morelle M.D.   On: 05/19/2020 10:47   DG CHEST PORT 1 VIEW  Result Date: 05/19/2020 CLINICAL DATA:  Sepsis, pneumonia, hemoptysis EXAM: PORTABLE CHEST 1 VIEW COMPARISON:  Portable exam 0515 hours without priors for comparison FINDINGS: Normal heart size and mediastinal contours. BILATERAL  pulmonary infiltrates favor multifocal pneumonia, less likely edema Probable small RIGHT pleural effusion. No pneumothorax or acute osseous findings. Vascular stents at the axilla bilaterally. IMPRESSION: Diffuse BILATERAL pulmonary infiltrates favoring multifocal pneumonia. Electronically Signed   By: Lavonia Dana M.D.   On: 05/19/2020 08:22     Scheduled Meds: . mometasone-formoterol  2 puff Inhalation BID   Continuous Infusions: . sodium chloride       LOS: 0 days   Time spent: 95 minutes 60 in direct care time and care coordination  Nita Sells, MD [facility] To contact the attending provider between 7A-7P or the covering provider during after hours 7P-7A, please log into the web site [email or web address] and access using universal [place] password for that web site. If you do not have the password, please call the hospital operator.  05/19/2020, 2:14 PM

## 2020-05-19 NOTE — Progress Notes (Signed)
Pharmacy Antibiotic Note  Brandon Chaney is a 65 y.o. male admitted on 05/19/2020 with pneumonia.  PMH significant for ESRD on HD, COPD, CAD, recent hospitalization for pneumonia.  Patient transferred from [facility] center, where H&P states patient received Ceftriaxone, Azithromycin and Vancomycin.  Spoke with current RN who confirmed Vancomycin was not given at outside facility (medication was sent with patient but not yet administered).  Last HD noted on 05/18/20.  Pharmacy has been consulted for Vancomycin and Cefepime dosing.  Plan:  Cefepime 2gm IV x 1  Vancomycin '1250mg'$  iV x 1  SCr pending  F/U culture results and sensitivities  F/u plans for HD and appropriate redosing of antibiotics  Height: '5\' 9"'$  (175.3 cm) Weight: 61.9 kg (136 lb 7.4 oz) IBW/kg (Calculated) : 70.7  Temp (24hrs), Avg:98.1 F (36.7 C), Min:98.1 F (36.7 C), Max:98.1 F (36.7 C)  No results for input(s): WBC, CREATININE, LATICACIDVEN, VANCOTROUGH, VANCOPEAK, VANCORANDOM, GENTTROUGH, GENTPEAK, GENTRANDOM, TOBRATROUGH, TOBRAPEAK, TOBRARND, AMIKACINPEAK, AMIKACINTROU, AMIKACIN in the last 168 hours.  CrCl cannot be calculated (No successful lab value found.).    Not on File  Antimicrobials this admission: 3/1 Cefepime >>   3/1 Vancomycin >>    Dose adjustments this admission:    Microbiology results: 3/1 BCx:   3/1 UCx:    3/1 Sputum:      Thank you for allowing pharmacy to be a part of this patients care.  Everette Rank, PharmD 05/19/2020 4:49 AM

## 2020-05-19 NOTE — Progress Notes (Signed)
Patient seen and evaluated earlier today Does still pass urine-not oliguric Given 1 dose of Lasix and dramatically better with no shortness of breath conversant talking to me and coherent Getting half unit of blood now Stable for progressive care

## 2020-05-19 NOTE — Consult Note (Signed)
Reason for Consult: To manage dialysis and dialysis related needs Referring Physician: Abhik Chaney is an 65 y.o. male with past medical history significant for COPD, coronary artery disease as well as ESRD-receives dialysis at [name] has been on dialysis for 5 years.  Patient states that prior to a hospitalization about a month ago he was in great health and very active.  Last month at [place] [facility] had a hospitalization for pneumonia.  After that hospitalization has required oxygen.  He also continues to be short of breath and has a productive cough.  Since he was not any better he decided to come here for second opinion.  After discharge from the hospital he returned to his dialysis unit.  He received full dialysis treatment yesterday.  He reports feeling slightly poorly at the end so that does indicate that likely fluid was not left on him.  Evaluation here found him to be febrile and hypoxic-chest x-ray indicated bilateral upper lobe infiltrates with also bilateral effusions-white blood count 11,000 and hemoglobin 5.5.  Covid negative.  He has just returned from a VQ scan-no results yet.  Able to talk in full sentences   Dialyzes at [facility]-  mwf  - 3 hours and 30 minutes EDW 61.5. HD [place] unclear, Dialyzer 180, Heparin no. Access left AVG. sensipar 40 q HD, hectorol 2 q tx Last hgb 6.9 on 2/21  Past Medical History:  Diagnosis Date  . CAD (coronary artery disease) 05/19/2020  . COPD (chronic obstructive pulmonary disease) ([place]) 05/19/2020  . ESRD (end stage renal disease) ([place]) 05/19/2020  . Herpes zoster ophthalmicus, right eye     Past Surgical History:  Procedure Laterality Date  . ARTERIOVENOUS GRAFT PLACEMENT Right   . CORONARY ANGIOPLASTY      Family History  Family history unknown: Yes    Social History:  reports that he has never smoked. He has never used smokeless tobacco. He reports previous alcohol use. He reports  previous drug use.  Allergies:  Allergies  Allergen Reactions  . Duragesic-100 [Fentanyl] Nausea Only  . Sulfa Antibiotics     Heart flutters/itching    Medications: I have reviewed the patient's current medications.   Results for orders placed or performed during the hospital encounter of 05/19/20 (from the past 48 hour(s))  HIV Antibody (routine testing w rflx)     Status: None   Collection Time: 05/19/20  5:10 AM  Result Value Ref Range   HIV Screen 4th Generation wRfx Non Reactive Non Reactive    Comment: Performed at [facility], [address], [place], [place] [address]  Culture, blood (x 2)     Status: None (Preliminary result)   Collection Time: 05/19/20  5:10 AM   Specimen: BLOOD LEFT HAND  Result Value Ref Range   Specimen Description      BLOOD LEFT HAND Performed at [facility] [address] [place], [place] [address]    Special Requests      BOTTLES DRAWN AEROBIC ONLY Blood Culture adequate volume Performed at [facility] [address], [place], [place] [address]    Culture      NO GROWTH < 12 HOURS Performed at [facility] [address] [place], [place] [address]    Report Status PENDING   Lactic acid, plasma     Status: None   Collection Time: 05/19/20  5:10 AM  Result Value Ref Range   Lactic  Acid, Venous 1.4 0.5 - 1.9 mmol/L    Comment: Performed at [facility], [place] [address], [place], [place] [address]  Comprehensive metabolic panel     Status: Abnormal   Collection Time: 05/19/20  5:10 AM  Result Value Ref Range   Sodium 138 135 - 145 mmol/L   Potassium 3.1 (L) 3.5 - 5.1 mmol/L   Chloride 95 (L) 98 - 111 mmol/L   CO2 26 22 - 32 mmol/L   Glucose, Bld 111 (H) 70 - 99 mg/dL    Comment: Glucose reference range applies only to samples taken after fasting for at least 8 hours.   BUN 31 (H) 8 - 23 mg/dL   Creatinine, Ser 4.06 (H) 0.61 - 1.24 mg/dL   Calcium 7.9 (L) 8.9 -  10.3 mg/dL   Total Protein 6.8 6.5 - 8.1 g/dL   Albumin 2.7 (L) 3.5 - 5.0 g/dL   AST 47 (H) 15 - 41 U/L   ALT 29 0 - 44 U/L   Alkaline Phosphatase 121 38 - 126 U/L   Total Bilirubin 0.9 0.3 - 1.2 mg/dL   GFR, Estimated 16 (L) >60 mL/min    Comment: (NOTE) Calculated using the CKD-EPI Creatinine Equation (2021)    Anion gap 17 (H) 5 - 15    Comment: Performed at [facility], [place] [address], [place], [place] [address]  Magnesium     Status: None   Collection Time: 05/19/20  5:10 AM  Result Value Ref Range   Magnesium 1.9 1.7 - 2.4 mg/dL    Comment: Performed at [facility], [place] [address], [place], [place] [address]  Troponin I (High Sensitivity)     Status: Abnormal   Collection Time: 05/19/20  5:10 AM  Result Value Ref Range   Troponin I (High Sensitivity) 160 (HH) <18 ng/L    Comment: CRITICAL RESULT CALLED TO, READ BACK BY AND VERIFIED WITH: VALIGOG,R. RN AT [id number] 05/19/20 MULLINS,T (NOTE) Elevated high sensitivity troponin I (hsTnI) values and significant  changes across serial measurements may suggest ACS but many other  chronic and acute conditions are known to elevate hsTnI results.  Refer to the Links section for chest pain algorithms and additional  guidance. Performed at [facility], [place] [address] [place], [place] [address]   D-dimer, quantitative     Status: Abnormal   Collection Time: 05/19/20  5:10 AM  Result Value Ref Range   D-Dimer, Quant 1.42 (H) 0.00 - 0.50 ug/mL-FEU    Comment: (NOTE) At the manufacturer cut-off value of 0.5 g/mL FEU, this assay has a negative predictive value of 95-100%.This assay is intended for use in conjunction with a clinical pretest probability (PTP) assessment model to exclude pulmonary embolism (PE) and deep venous thrombosis (DVT) in outpatients suspected of PE or DVT. Results should be correlated with clinical presentation. Performed at [facility], [place] [address], [place], [place] [address]   Procalcitonin - Baseline     Status: None   Collection Time: 05/19/20  5:10 AM  Result Value Ref Range   Procalcitonin 23.01 ng/mL    Comment:        Interpretation: PCT >= 10 ng/mL: Important systemic inflammatory response, almost exclusively due to severe bacterial sepsis or septic shock. (NOTE)       Sepsis PCT Algorithm           Lower Respiratory Tract  Infection PCT Algorithm    ----------------------------     ----------------------------         PCT < 0.25 ng/mL                PCT < 0.10 ng/mL          Strongly encourage             Strongly discourage   discontinuation of antibiotics    initiation of antibiotics    ----------------------------     -----------------------------       PCT 0.25 - 0.50 ng/mL            PCT 0.10 - 0.25 ng/mL               OR       >80% decrease in PCT            Discourage initiation of                                            antibiotics      Encourage discontinuation           of antibiotics    ----------------------------     -----------------------------         PCT >= 0.50 ng/mL              PCT 0.26 - 0.50 ng/mL                AND       <80% decrease in PCT             Encourage initiation of                                             antibiotics       Encourage continuation           of antibiotics    ----------------------------     -----------------------------        PCT >= 0.50 ng/mL                  PCT > 0.50 ng/mL               AND         increase in PCT                  Strongly encourage                                      initiation of antibiotics    Strongly encourage escalation           of antibiotics                                     -----------------------------                                           PCT <= 0.25 ng/mL  OR                                        > 80%  decrease in PCT                                      Discontinue / Do not initiate                                             antibiotics  Performed at [facility] [address], [place], [place] [address]   Type and screen [place]     Status: None (Preliminary result)   Collection Time: 05/19/20  5:10 AM  Result Value Ref Range   ABO/RH(D) A POS    Antibody Screen NEG    Sample Expiration 05/22/2020,2359    Unit Number [id number]    Blood Component Type RED CELLS,LR    Unit division 00    Status of Unit ISSUED    Transfusion Status OK TO TRANSFUSE    Crossmatch Result      Compatible Performed at [facility], [place] [address] [place], [place] [address]   CBC     Status: Abnormal   Collection Time: 05/19/20  5:10 AM  Result Value Ref Range   WBC 9.6 4.0 - 10.5 K/uL   RBC 1.95 (L) 4.22 - 5.81 MIL/uL   Hemoglobin 5.5 (LL) 13.0 - 17.0 g/dL    Comment: REPEATED TO VERIFY THIS CRITICAL RESULT HAS VERIFIED AND BEEN CALLED TO BALIGOD, R. RN BY NICOLE MCCOY ON 03 01 2022 AT [place], AND HAS BEEN READ BACK. CRITICAL RESULT VERIFIED    HCT 18.5 (L) 39.0 - 52.0 %   MCV 94.9 80.0 - 100.0 fL   MCH 28.2 26.0 - 34.0 pg   MCHC 29.7 (L) 30.0 - 36.0 g/dL   RDW 19.2 (H) 11.5 - 15.5 %   Platelets 196 150 - 400 K/uL   nRBC 0.0 0.0 - 0.2 %    Comment: Performed at [facility], [place] [address], [place], [place] [address]  Prepare RBC (crossmatch)     Status: None   Collection Time: 05/19/20  5:10 AM  Result Value Ref Range   Order Confirmation      ORDER PROCESSED BY BLOOD BANK Performed at [facility] [address] [place],  [id number]   Basic metabolic panel     Status: Abnormal   Collection Time: 05/19/20  5:17 AM  Result Value Ref Range   Sodium 138 135 - 145 mmol/L   Potassium 3.1 (L) 3.5 - 5.1 mmol/L   Chloride 95 (L) 98 - 111 mmol/L   CO2 31 22 - 32 mmol/L   Glucose,  Bld 113 (H) 70 - 99 mg/dL    Comment: Glucose reference range applies only to samples taken after fasting for at least 8 hours.   BUN 33 (H) 8 - 23 mg/dL   Creatinine, Ser 4.15 (H) 0.61 - 1.24 mg/dL   Calcium 8.1 (L) 8.9 - 10.3 mg/dL   GFR, Estimated 15 (L) >60 mL/min    Comment: (NOTE) Calculated using the CKD-EPI Creatinine Equation (2021)  Anion gap 12 5 - 15    Comment: Performed at [facility], [place] [address] [place], [place] [address]  Procalcitonin     Status: None   Collection Time: 05/19/20  5:17 AM  Result Value Ref Range   Procalcitonin 23.33 ng/mL    Comment:        Interpretation: PCT >= 10 ng/mL: Important systemic inflammatory response, almost exclusively due to severe bacterial sepsis or septic shock. (NOTE)       Sepsis PCT Algorithm           Lower Respiratory Tract                                      Infection PCT Algorithm    ----------------------------     ----------------------------         PCT < 0.25 ng/mL                PCT < 0.10 ng/mL          Strongly encourage             Strongly discourage   discontinuation of antibiotics    initiation of antibiotics    ----------------------------     -----------------------------       PCT 0.25 - 0.50 ng/mL            PCT 0.10 - 0.25 ng/mL               OR       >80% decrease in PCT            Discourage initiation of                                            antibiotics      Encourage discontinuation           of antibiotics    ----------------------------     -----------------------------         PCT >= 0.50 ng/mL              PCT 0.26 - 0.50 ng/mL                AND       <80% decrease in PCT             Encourage initiation of                                             antibiotics       Encourage continuation           of antibiotics    ----------------------------     -----------------------------        PCT >= 0.50 ng/mL                  PCT > 0.50 ng/mL               AND          increase in PCT                  Strongly encourage  initiation of antibiotics    Strongly encourage escalation           of antibiotics                                     -----------------------------                                           PCT <= 0.25 ng/mL                                                 OR                                        > 80% decrease in PCT                                      Discontinue / Do not initiate                                             antibiotics  Performed at [facility] [address] [place], [place] [address]   Vitamin B12     Status: Abnormal   Collection Time: 05/19/20  5:17 AM  Result Value Ref Range   Vitamin B-12 1,242 (H) 180 - 914 pg/mL    Comment: (NOTE) This assay is not validated for testing neonatal or myeloproliferative syndrome specimens for Vitamin B12 levels. Performed at [facility], [place] [address] [place], [place] [address]   Folate     Status: None   Collection Time: 05/19/20  5:17 AM  Result Value Ref Range   Folate 20.0 >5.9 ng/mL    Comment: Performed at [facility], [place] [address] [place], [place] [address]  Iron and TIBC     Status: Abnormal   Collection Time: 05/19/20  5:17 AM  Result Value Ref Range   Iron 34 (L) 45 - 182 ug/dL   TIBC 166 (L) 250 - 450 ug/dL   Saturation Ratios 21 17.9 - 39.5 %   UIBC 132 ug/dL    Comment: Performed at [facility], [place] [address] [place], [place] [address]  Ferritin     Status: Abnormal   Collection Time: 05/19/20  5:17 AM  Result Value Ref Range   Ferritin >7,500 (H) 24 - 336 ng/mL    Comment: Performed at [facility], [place] [address] [place], [place] [address]  Reticulocytes     Status: Abnormal   Collection Time: 05/19/20  5:17 AM  Result Value Ref Range   Retic Ct Pct 5.3 (H) 0.4 - 3.1 %   RBC. 2.99 (L) 4.22 -  5.81 MIL/uL   Retic Count, Absolute 159.1 19.0 - 186.0 K/uL   Immature Retic Fract 25.9 (H) 2.3 - 15.9 %    Comment: Performed at [facility], [place] [address], [place], [place] [address]  Culture, blood (x 2)     Status: None (Preliminary result)   Collection Time: 05/19/20  5:23 AM   Specimen: BLOOD  Result Value Ref Range   Specimen Description      BLOOD LEFT ANTECUBITAL Performed at [facility], [place] [address] [place], [place] [address]    Special Requests      BOTTLES DRAWN AEROBIC AND ANAEROBIC Blood Culture adequate volume Performed at [facility] [address] [place], [place] [address]    Culture      NO GROWTH < 12 HOURS Performed at [facility] [address], [place], [place] [address]    Report Status PENDING   ABO/Rh     Status: None   Collection Time: 05/19/20  7:31 AM  Result Value Ref Range   ABO/RH(D)      A POS Performed at [facility], [place] [address] [place], [place] [address]   Culture, sputum-assessment     Status: None   Collection Time: 05/19/20 10:07 AM   Specimen: Sputum  Result Value Ref Range   Specimen Description SPUTUM    Special Requests NONE    Sputum evaluation      THIS SPECIMEN IS ACCEPTABLE FOR SPUTUM CULTURE Performed at [facility], [place] [address], [place], [place] [address]    Report Status 05/19/2020 FINAL     DG CHEST PORT 1 VIEW  Result Date: 05/19/2020 CLINICAL DATA:  Pneumonia. EXAM: PORTABLE CHEST 1 VIEW COMPARISON:  One-view chest x-ray 05/19/2020 FINDINGS: Heart is enlarged. Atherosclerotic calcifications are present. Diffuse interstitial pattern is again seen. Moderate right pleural effusion present. No pneumothorax. Bibasilar airspace disease is worse right than left. IMPRESSION: 1. Cardiomegaly with diffuse interstitial pattern compatible with edema. Infection is not excluded. Patchy opacities seen on the prior  study are less prominent. Pattern is more diffuse. 2. Moderate right pleural effusion. 3. Bibasilar airspace disease is worse on the right than left. Electronically Signed   By: [place] Morelle M.D.   On: 05/19/2020 10:47   DG CHEST PORT 1 VIEW  Result Date: 05/19/2020 CLINICAL DATA:  Sepsis, pneumonia, hemoptysis EXAM: PORTABLE CHEST 1 VIEW COMPARISON:  Portable exam 0515 hours without priors for comparison FINDINGS: Normal heart size and mediastinal contours. BILATERAL pulmonary infiltrates favor multifocal pneumonia, less likely edema Probable small RIGHT pleural effusion. No pneumothorax or acute osseous findings. Vascular stents at the axilla bilaterally. IMPRESSION: Diffuse BILATERAL pulmonary infiltrates favoring multifocal pneumonia. Electronically Signed   By: Lavonia Dana M.D.   On: 05/19/2020 08:22    ROS:  SOB-  Productive cough Blood pressure (!) 127/38, pulse 81, temperature 98.4 F (36.9 C), temperature source Oral, resp. rate 20, height '5\' 9"'$  (1.753 m), weight 61.9 kg, SpO2 93 %. General appearance: alert, cooperative and mild distress Resp: diminished breath sounds bibasilar Cardio: regular rate and rhythm, S1, S2 normal, no murmur, click, rub or gallop GI: soft, non-tender; bowel sounds normal; no masses,  no organomegaly Extremities: extremities normal, atraumatic, no cyanosis or edema Neurologic: Grossly normal right upper arm AVG- patent  Assessment/Plan: 65 year old BM with ESRD and COPD-  Recent hosp for PNA remains SOB-  Very anemic and with pleural effusion 1 SOB-  Likely multifactorial.  Has history of COPD.  Just got over pneumonia so could have some residual from that or some deconditioning.  Now the added impact of being very anemic and  also having a pleural effusion.  Consider thoracentesis ?   Previous fever but not outrageous white blood count.  Given empiric antibiotics on admission 2 ESRD: Normally Monday Wednesday Friday via AV graft.  Received full treatment  on Monday.  Next treatment should be Wednesday.  We will try to ultrafilter as able to help with pleural effusion.  However, the way that he felt at the end and after treatment on Monday indicates there may not be much more that we can get/pull. 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  However, anemia seems out of proportion to just ESRD.  Getting transfusion today.  We will go ahead and give IV iron and high dose ESA to assist as well.  Most certainly contributing to his feeling of shortness of breath 5. Metabolic Bone Disease: We will continue his home doses of Hectorol, Sensipar - hold phoslo for now as he does not seem to be eating much    Louis Meckel 05/19/2020, 1:45 PM

## 2020-05-19 NOTE — Progress Notes (Signed)
PROGRESS NOTE   Brandon Chaney  HWE:[id number] DOB: 1955-10-11 DOA: 05/19/2020 PCP: No primary care provider on file.  Brief Narrative:  65 year old BM ESRD on HD-in [place]-proximal right upper extremity graft underwent hemodialysis in [place] yesterday 2 L for 3 and half hours usually has 1 L drawn off of him Dry weight is 61 kg COPD CAD + stents 02/2020 EF dropped from 45% to 25% recently Herpes zoster ophthalmicus Admit several weeks ago for 6 days at [facility] for -was discharged after getting 3 units of blood Then went to [facility] 3 days later and stayed there for 3 weeks with cough bloody expectoration despite antibiotic use Had pneumonia and PNA discharged on baseline 3 L  Had hemodialysis yesterday.  Posttreatment Felt very fatigued at the end of it Does not recall having endoscopy or colonoscopy at either hospital Does not report dark tarry stools bloody emesis etc. Bolused 500 cc IV fluid in ED--I/O today +627  Labs showed BUN/creatinine 33/4.1 Potassium 3.1 Iron 34 saturation was 21 ferritin above 7500 Procalcitonin 23  Hemoglobin 6.2 Patient developed shortness of breath after transfusion  I have tried X.3 to get records and nurse tech on 4 E has emailed [facility]-Problem based course  Dyspnea Multifactorial DDx pneumonia/high output heart failure Recently discharged from [place] fear with Citrobacter and given 1 month of antibiotics projected end date at the time of that hospitalization was 05/04/2020 Continue cefepime vancomycin (procalcitonin 23) but do not give fluids given effusion at this time Had to stop transfusion as he was feeling winded Given Lasix X1 40 mg and improved significantly Patient stable on reassessment at 16:30 pm Pleural effusion 1 view Chest x-ray 3/1 = moderate pleural effusion Placed order for IR and informed him of his transfer to [facility] thoracentesis-discussed with Dr. Anselm Pancoast IR regarding  patient transfer he is aware of patient and will perform the same a.m. Hemoptysis Underwent bronchoscopy at [place] fear BAL culture = Citrobacter negative for malignancy negative for autoimmune disease-etiology felt to be?  DOAC Acute anemia likely combination of renal disease, unclear blood loss  Hemoccult stool Giving half a bag of blood given shortness of breath earlier today ESRD MWF right upper extremity [place] Dry weight 61, recent dialysis 2 L 2/28 Defer to nephrology rest CAD status post stent 02/2020 drop in EF from 45 to 25% Will need nonemergent cardiology evaluation for feasibility of intervention although probably need to be more stable prior to that Pls call cardiology in am 3/2 post transfusion and thoracentesis Chronic pain Home meds Percocet 1 tab every 4 as needed COPD Herpes ophthalmicus  DVT prophylaxis: scd Code Status: FULL Family Communication: wife fully updated at the bedside-phone [phone] I have explained to both of them in no uncertain terms his high risk for getting sicker-explained that he will need a thoracentesis and that he is probably not a great candidate for any type of cardiac intervention at this time until we can get him better They understand all this-he remains full code Disposition:  Status is: Inpatient  Remains inpatient appropriate because:Hemodynamically unstable, Altered mental status, Ongoing diagnostic testing needed not appropriate for outpatient work up, Unsafe d/c plan, IV treatments appropriate due to intensity of illness or inability to take PO and Inpatient level of care appropriate due to severity of illness   Dispo: The patient is from: Home              Anticipated d/c is to: tbd  Patient currently is not medically stable to d/c.   Difficult to place patient No  Consultants:   Nephrology   IR  Cardiology  Procedures: IR thoracentesis  Antimicrobials: Vanc Cefepime    Subjective: Some sob  after transfufion earlier today relieved with Lasix CXR showed effusion on R side He was reviewed again 1630 and is stable without chest pain arm pain or any other issues at this time Reiterated with him/wife next steps  He is coherent alert and oriented x4 No chest pain no fever no nausea no vomiting no diarrhea   Objective: Vitals:   05/19/20 1557 05/19/20 1616 05/19/20 1743 05/19/20 1830  BP: (!) 130/56 (!) 126/42 (!) 124/101   Pulse: 81 81    Resp: 20 20    Temp: 97.8 F (36.6 C) 98 F (36.7 C) 98.9 F (37.2 C)   TempSrc: Oral Oral Oral   SpO2: 93% 93% 96%   Weight:    60.6 kg  Height:        Intake/Output Summary (Last 24 hours) at 05/19/2020 1932 Last data filed at 05/19/2020 1305 Gross per 24 hour  Intake 763.76 ml  Output 100 ml  Net 663.76 ml   Filed Weights   05/19/20 0145 05/19/20 1830  Weight: 61.9 kg 60.6 kg    Examination:  Awake coherent pleasant on 2 L of oxygen Quite asthenic, mild JVD S1-S2 no murmur no rub no gallop Abdomen soft no murmur EOMI NCAT no focal deficit Neurologically intact TMA left foot, right upper arm AV graft good bruit  Data Reviewed: personally reviewed   CBC    Component Value Date/Time   WBC 9.6 05/19/2020 0510   RBC 2.99 (L) 05/19/2020 0517   RBC 1.95 (L) 05/19/2020 0510   HGB 5.5 (LL) 05/19/2020 0510   HCT 18.5 (L) 05/19/2020 0510   PLT 196 05/19/2020 0510   MCV 94.9 05/19/2020 0510   MCH 28.2 05/19/2020 0510   MCHC 29.7 (L) 05/19/2020 0510   RDW 19.2 (H) 05/19/2020 0510   CMP Latest Ref Rng & Units 05/19/2020 05/19/2020  Glucose 70 - 99 mg/dL 113(H) 111(H)  BUN 8 - 23 mg/dL 33(H) 31(H)  Creatinine 0.61 - 1.24 mg/dL 4.15(H) 4.06(H)  Sodium 135 - 145 mmol/L 138 138  Potassium 3.5 - 5.1 mmol/L 3.1(L) 3.1(L)  Chloride 98 - 111 mmol/L 95(L) 95(L)  CO2 22 - 32 mmol/L 31 26  Calcium 8.9 - 10.3 mg/dL 8.1(L) 7.9(L)  Total Protein 6.5 - 8.1 g/dL - 6.8  Total Bilirubin 0.3 - 1.2 mg/dL - 0.9  Alkaline Phos 38 - 126 U/L -  121  AST 15 - 41 U/L - 47(H)  ALT 0 - 44 U/L - 29     Radiology Studies: NM Pulmonary Perfusion  Result Date: 05/19/2020 CLINICAL DATA:  Chest pain, shortness of breath and elevated D-dimer. The patient has pneumonia. EXAM: NUCLEAR MEDICINE PERFUSION LUNG SCAN TECHNIQUE: Perfusion images were obtained in multiple projections after intravenous injection of radiopharmaceutical. Ventilation scans intentionally deferred if perfusion scan and chest x-ray adequate for interpretation during COVID 19 epidemic. RADIOPHARMACEUTICALS:  4.4 mCi Tc-68mMAA IV COMPARISON:  Single-view of the chest earlier today. FINDINGS: Small nonsegmental defects are seen in the upper lobes bilaterally. Defect on the right corresponds with finding on chest film. No definite correlation with defect on the left is seen. Perfusion is otherwise normal. IMPRESSION: Small nonsegmental defects in the upper lobes bilaterally are likely related to the patient's pneumonia rather than pulmonary embolus. Electronically Signed  By: Inge Rise M.D.   On: 05/19/2020 14:43   DG CHEST PORT 1 VIEW  Result Date: 05/19/2020 CLINICAL DATA:  Pneumonia. EXAM: PORTABLE CHEST 1 VIEW COMPARISON:  One-view chest x-ray 05/19/2020 FINDINGS: Heart is enlarged. Atherosclerotic calcifications are present. Diffuse interstitial pattern is again seen. Moderate right pleural effusion present. No pneumothorax. Bibasilar airspace disease is worse right than left. IMPRESSION: 1. Cardiomegaly with diffuse interstitial pattern compatible with edema. Infection is not excluded. Patchy opacities seen on the prior study are less prominent. Pattern is more diffuse. 2. Moderate right pleural effusion. 3. Bibasilar airspace disease is worse on the right than left. Electronically Signed   By: San Morelle M.D.   On: 05/19/2020 10:47   DG CHEST PORT 1 VIEW  Result Date: 05/19/2020 CLINICAL DATA:  Sepsis, pneumonia, hemoptysis EXAM: PORTABLE CHEST 1 VIEW COMPARISON:   Portable exam 0515 hours without priors for comparison FINDINGS: Normal heart size and mediastinal contours. BILATERAL pulmonary infiltrates favor multifocal pneumonia, less likely edema Probable small RIGHT pleural effusion. No pneumothorax or acute osseous findings. Vascular stents at the axilla bilaterally. IMPRESSION: Diffuse BILATERAL pulmonary infiltrates favoring multifocal pneumonia. Electronically Signed   By: Lavonia Dana M.D.   On: 05/19/2020 08:22     Scheduled Meds: . sodium chloride   Intravenous Once  . [START ON 05/20/2020] Chlorhexidine Gluconate Cloth  6 each Topical Q0600  . [START ON 05/20/2020] cinacalcet  60 mg Oral Q M,W,F  . [START ON 05/20/2020] darbepoetin (ARANESP) injection - DIALYSIS  200 mcg Intravenous Q Wed-HD  . [START ON 05/20/2020] doxercalciferol  2 mcg Intravenous Q M,W,F-HD  . furosemide  20 mg Intravenous Once  . mometasone-formoterol  2 puff Inhalation BID   Continuous Infusions: . sodium chloride    . ferric gluconate (FERRLECIT/NULECIT) IV       LOS: 0 days   Time spent: 95 minutes 60 in direct care time and care coordination  Nita Sells, MD [facility] To contact the attending provider between 7A-7P or the covering provider during after hours 7P-7A, please log into the web site [email or web address] and access using universal [place] password for that web site. If you do not have the password, please call the hospital operator.  05/19/2020, 7:32 PM

## 2020-05-20 ENCOUNTER — Inpatient Hospital Stay (HOSPITAL_COMMUNITY): Payer: Medicare Other

## 2020-05-20 ENCOUNTER — Other Ambulatory Visit (HOSPITAL_COMMUNITY): Payer: Medicare Other

## 2020-05-20 DIAGNOSIS — J189 Pneumonia, unspecified organism: Secondary | ICD-10-CM | POA: Diagnosis not present

## 2020-05-20 DIAGNOSIS — A419 Sepsis, unspecified organism: Secondary | ICD-10-CM | POA: Diagnosis not present

## 2020-05-20 DIAGNOSIS — N186 End stage renal disease: Secondary | ICD-10-CM | POA: Diagnosis not present

## 2020-05-20 DIAGNOSIS — D649 Anemia, unspecified: Secondary | ICD-10-CM

## 2020-05-20 HISTORY — PX: IR THORACENTESIS ASP PLEURAL SPACE W/IMG GUIDE: IMG5380

## 2020-05-20 LAB — BODY FLUID CELL COUNT WITH DIFFERENTIAL
Eos, Fluid: 0 %
Lymphs, Fluid: 34 %
Monocyte-Macrophage-Serous Fluid: 64 % (ref 50–90)
Neutrophil Count, Fluid: 1 % (ref 0–25)
Total Nucleated Cell Count, Fluid: 250 cu mm (ref 0–1000)

## 2020-05-20 LAB — BASIC METABOLIC PANEL
Anion gap: 16 — ABNORMAL HIGH (ref 5–15)
BUN: 51 mg/dL — ABNORMAL HIGH (ref 8–23)
CO2: 24 mmol/L (ref 22–32)
Calcium: 7.5 mg/dL — ABNORMAL LOW (ref 8.9–10.3)
Chloride: 95 mmol/L — ABNORMAL LOW (ref 98–111)
Creatinine, Ser: 5.75 mg/dL — ABNORMAL HIGH (ref 0.61–1.24)
GFR, Estimated: 10 mL/min — ABNORMAL LOW (ref >60)
Glucose, Bld: 170 mg/dL — ABNORMAL HIGH (ref 70–99)
Potassium: 3.3 mmol/L — ABNORMAL LOW (ref 3.5–5.1)
Sodium: 135 mmol/L (ref 135–145)

## 2020-05-20 LAB — CBC WITH DIFFERENTIAL/PLATELET
Abs Immature Granulocytes: 0.05 10*3/uL (ref 0.00–0.07)
Basophils Absolute: 0.1 10*3/uL (ref 0.0–0.1)
Basophils Relative: 1 %
Eosinophils Absolute: 0.1 10*3/uL (ref 0.0–0.5)
Eosinophils Relative: 1 %
HCT: 23.2 % — ABNORMAL LOW (ref 39.0–52.0)
Hemoglobin: 7.6 g/dL — ABNORMAL LOW (ref 13.0–17.0)
Immature Granulocytes: 1 %
Lymphocytes Relative: 10 %
Lymphs Abs: 0.9 10*3/uL (ref 0.7–4.0)
MCH: 29.7 pg (ref 26.0–34.0)
MCHC: 32.8 g/dL (ref 30.0–36.0)
MCV: 90.6 fL (ref 80.0–100.0)
Monocytes Absolute: 0.3 10*3/uL (ref 0.1–1.0)
Monocytes Relative: 4 %
Neutro Abs: 7.2 10*3/uL (ref 1.7–7.7)
Neutrophils Relative %: 83 %
Platelets: 219 10*3/uL (ref 150–400)
RBC: 2.56 MIL/uL — ABNORMAL LOW (ref 4.22–5.81)
RDW: 18.3 % — ABNORMAL HIGH (ref 11.5–15.5)
WBC: 8.6 10*3/uL (ref 4.0–10.5)
nRBC: 0.4 % — ABNORMAL HIGH (ref 0.0–0.2)

## 2020-05-20 LAB — LACTATE DEHYDROGENASE, PLEURAL OR PERITONEAL FLUID: LD, Fluid: 106 U/L — ABNORMAL HIGH (ref 3–23)

## 2020-05-20 LAB — HEPATITIS B CORE ANTIBODY, TOTAL: Hep B Core Total Ab: REACTIVE — AB

## 2020-05-20 LAB — PROCALCITONIN: Procalcitonin: 38.64 ng/mL

## 2020-05-20 LAB — MAGNESIUM: Magnesium: 2 mg/dL (ref 1.7–2.4)

## 2020-05-20 LAB — ALBUMIN, PLEURAL OR PERITONEAL FLUID: Albumin, Fluid: 1.1 g/dL

## 2020-05-20 LAB — OCCULT BLOOD X 1 CARD TO LAB, STOOL: Fecal Occult Bld: NEGATIVE

## 2020-05-20 LAB — HEPATITIS B SURFACE ANTIGEN: Hepatitis B Surface Ag: NONREACTIVE

## 2020-05-20 LAB — PROTEIN, PLEURAL OR PERITONEAL FLUID: Total protein, fluid: <3 g/dL

## 2020-05-20 LAB — PHOSPHORUS: Phosphorus: 4.2 mg/dL (ref 2.5–4.6)

## 2020-05-20 LAB — TROPONIN I (HIGH SENSITIVITY): Troponin I (High Sensitivity): 175 ng/L (ref <18)

## 2020-05-20 MED ORDER — RISAQUAD PO CAPS
1.0000 | ORAL_CAPSULE | Freq: Every day | ORAL | Status: DC
  Administered 2020-05-20 – 2020-05-30 (×11): 1 via ORAL
  Filled 2020-05-20 (×11): qty 1

## 2020-05-20 MED ORDER — UMECLIDINIUM BROMIDE 62.5 MCG/INH IN AEPB
1.0000 | INHALATION_SPRAY | Freq: Every day | RESPIRATORY_TRACT | Status: DC
  Administered 2020-05-21 – 2020-05-30 (×8): 1 via RESPIRATORY_TRACT
  Filled 2020-05-20: qty 7

## 2020-05-20 MED ORDER — ATORVASTATIN CALCIUM 80 MG PO TABS
80.0000 mg | ORAL_TABLET | Freq: Every day | ORAL | Status: DC
  Administered 2020-05-20 – 2020-05-30 (×11): 80 mg via ORAL
  Filled 2020-05-20: qty 2
  Filled 2020-05-20 (×10): qty 1

## 2020-05-20 MED ORDER — FAMOTIDINE 20 MG PO TABS
20.0000 mg | ORAL_TABLET | ORAL | Status: DC
  Administered 2020-05-20 – 2020-05-29 (×5): 20 mg via ORAL
  Filled 2020-05-20 (×6): qty 1

## 2020-05-20 MED ORDER — LIDOCAINE HCL 1 % IJ SOLN
INTRAMUSCULAR | Status: AC
  Filled 2020-05-20: qty 20

## 2020-05-20 MED ORDER — VANCOMYCIN HCL IN DEXTROSE 500-5 MG/100ML-% IV SOLN: 500.0000 mg | INTRAVENOUS | Status: AC

## 2020-05-20 MED ORDER — VANCOMYCIN HCL IN DEXTROSE 500-5 MG/100ML-% IV SOLN
INTRAVENOUS | Status: AC
  Administered 2020-05-20: 500 mg
  Filled 2020-05-20: qty 100

## 2020-05-20 MED ORDER — VANCOMYCIN HCL IN DEXTROSE 750-5 MG/150ML-% IV SOLN
750.0000 mg | INTRAVENOUS | Status: DC
  Filled 2020-05-20 (×2): qty 150

## 2020-05-20 MED ORDER — DOXERCALCIFEROL 4 MCG/2ML IV SOLN
INTRAVENOUS | Status: AC
  Filled 2020-05-20: qty 2

## 2020-05-20 MED ORDER — SODIUM CHLORIDE 0.9 % IV SOLN
1.0000 g | INTRAVENOUS | Status: DC
  Administered 2020-05-20 – 2020-05-24 (×5): 1 g via INTRAVENOUS
  Filled 2020-05-20 (×7): qty 1

## 2020-05-20 MED ORDER — TICAGRELOR 90 MG PO TABS
90.0000 mg | ORAL_TABLET | Freq: Two times a day (BID) | ORAL | Status: DC
Start: 2020-05-20 — End: 2020-05-30
  Administered 2020-05-20 – 2020-05-30 (×20): 90 mg via ORAL
  Filled 2020-05-20 (×20): qty 1

## 2020-05-20 MED ORDER — LIDOCAINE HCL (PF) 1 % IJ SOLN
INTRAMUSCULAR | Status: DC | PRN
  Administered 2020-05-20: 10 mL

## 2020-05-20 MED ORDER — TIOTROPIUM BROMIDE MONOHYDRATE 18 MCG IN CAPS: 18.0000 ug | ORAL_CAPSULE | Freq: Every day | RESPIRATORY_TRACT | Status: DC

## 2020-05-20 MED ORDER — NEPRO/CARBSTEADY PO LIQD
237.0000 mL | Freq: Two times a day (BID) | ORAL | Status: DC
  Administered 2020-05-21 – 2020-05-30 (×14): 237 mL via ORAL

## 2020-05-20 MED ORDER — DIPHENHYDRAMINE HCL 25 MG PO CAPS
25.0000 mg | ORAL_CAPSULE | Freq: Three times a day (TID) | ORAL | Status: AC | PRN
  Administered 2020-05-20: 25 mg via ORAL
  Filled 2020-05-20: qty 1

## 2020-05-20 MED ORDER — RENA-VITE PO TABS
1.0000 | ORAL_TABLET | Freq: Every day | ORAL | Status: DC
  Administered 2020-05-20 – 2020-05-29 (×10): 1 via ORAL
  Filled 2020-05-20 (×10): qty 1

## 2020-05-20 MED ORDER — DARBEPOETIN ALFA 200 MCG/0.4ML IJ SOSY
PREFILLED_SYRINGE | INTRAMUSCULAR | Status: AC
  Filled 2020-05-20: qty 0.4

## 2020-05-20 NOTE — Progress Notes (Signed)
  Echocardiogram 2D Echocardiogram was attempted but patient was going to IR.   Jennette Dubin 05/20/2020, 2:13 PM

## 2020-05-20 NOTE — Progress Notes (Signed)
Subjective:  Says did not sleep-  Had some kind of "reaction " to blood so did not get full transfusion.  Still SOB-  Due for HD today    Objective Vital signs in last 24 hours: Vitals:   05/19/20 2300 05/19/20 2341 05/20/20 0400 05/20/20 0721  BP: (!) 149/45 (!) 145/50 (!) 152/49   Pulse: 87 86 89   Resp: 20 20 (!) 21   Temp:  99.1 F (37.3 C) 98.5 F (36.9 C)   TempSrc:  Oral Oral   SpO2: 95% 95% 94% 93%  Weight:      Height:       Weight change: -1.3 kg  Intake/Output Summary (Last 24 hours) at 05/20/2020 0734 Last data filed at 05/19/2020 2000 Gross per 24 hour  Intake 786.66 ml  Output 150 ml  Net 636.66 ml   Dialyzes at [employer]-  mwf  - 3 hours and 30 minutes EDW 61.5. HD Bath unclear, Dialyzer 180, Heparin no. Access left AVG. sensipar 14 q HD, hectorol 2 q tx Last hgb 6.9 on 2/21  Assessment/Plan: 65 year old BM with ESRD and COPD-  Recent hosp for PNA remains SOB-  Very anemic and with pleural effusion 1 SOB-  Likely multifactorial.  Has history of COPD.  Just got over pneumonia so could have some residual from that or some deconditioning.  Now the added impact of being very anemic and also having a pleural effusion.  Consider thoracentesis -  I think is planned for today.   Previous fever but not outrageous white blood count.  Given empiric antibiotics on admission.  Will UF as able with HD today  2 ESRD: Normally Monday Wednesday Friday via AV graft.  Received full treatment on Monday.  Next treatment should be Wednesday.  We will try to ultrafilter as able to help with pleural effusion.  However, the way that he felt at the end and after treatment on Monday indicates there may not be much more that we can get/pull. 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  However, anemia seems out of proportion to just ESRD.  Got partial transfusion yesterday.   will go ahead and  give IV iron and high dose ESA to assist as well.  Most certainly contributing to his feeling of shortness of breath 5. Metabolic Bone Disease: We will continue his home doses of Hectorol, Sensipar - hold phoslo for now as he does not seem to be eating much and phos is [employer]    Labs: Basic Metabolic Panel: Recent Labs  Lab 05/19/20 0510 05/19/20 0517 05/20/20 0312  NA 138 138 135  K 3.1* 3.1* 3.3*  CL 95* 95* 95*  CO2 '26 31 24  '$ GLUCOSE 111* 113* 170*  BUN 31* 33* 51*  CREATININE 4.06* 4.15* 5.75*  CALCIUM 7.9* 8.1* 7.5*  PHOS  --   --  4.2   Liver Function Tests: Recent Labs  Lab 05/19/20 0510  AST 47*  ALT 29  ALKPHOS 121  BILITOT 0.9  PROT 6.8  ALBUMIN 2.7*   No results for input(s): LIPASE, AMYLASE in the last 168 hours. No results for input(s): AMMONIA in the last 168 hours. CBC: Recent Labs  Lab 05/19/20 0510 05/19/20 2201 05/20/20 0312  WBC 9.6 7.7 8.6  NEUTROABS  --  6.3 7.2  HGB 5.5* 7.8* 7.6*  HCT 18.5* 24.3* 23.2*  MCV  94.9 91.7 90.6  PLT 196 212 219   Cardiac Enzymes: No results for input(s): CKTOTAL, CKMB, CKMBINDEX, TROPONINI in the last 168 hours. CBG: No results for input(s): GLUCAP in the last 168 hours.  Iron Studies:  Recent Labs    05/19/20 0517  IRON 34*  TIBC 166*  FERRITIN >7,500*   Studies/Results: NM Pulmonary Perfusion  Result Date: 05/19/2020 CLINICAL DATA:  Chest pain, shortness of breath and elevated D-dimer. The patient has pneumonia. EXAM: NUCLEAR MEDICINE PERFUSION LUNG SCAN TECHNIQUE: Perfusion images were obtained in multiple projections after intravenous injection of radiopharmaceutical. Ventilation scans intentionally deferred if perfusion scan and chest x-ray adequate for interpretation during COVID 19 epidemic. RADIOPHARMACEUTICALS:  4.4 mCi Tc-23mMAA IV COMPARISON:  Single-view of the chest earlier today. FINDINGS: Small nonsegmental defects are seen in the upper lobes bilaterally. Defect on  the right corresponds with finding on chest film. No definite correlation with defect on the left is seen. Perfusion is otherwise normal. IMPRESSION: Small nonsegmental defects in the upper lobes bilaterally are likely related to the patient's pneumonia rather than pulmonary embolus. Electronically Signed   By: TInge RiseM.D.   On: 05/19/2020 14:43   DG CHEST PORT 1 VIEW  Result Date: 05/19/2020 CLINICAL DATA:  Pneumonia. EXAM: PORTABLE CHEST 1 VIEW COMPARISON:  One-view chest x-ray 05/19/2020 FINDINGS: Heart is enlarged. Atherosclerotic calcifications are present. Diffuse interstitial pattern is again seen. Moderate right pleural effusion present. No pneumothorax. Bibasilar airspace disease is worse right than left. IMPRESSION: 1. Cardiomegaly with diffuse interstitial pattern compatible with edema. Infection is not excluded. Patchy opacities seen on the prior study are less prominent. Pattern is more diffuse. 2. Moderate right pleural effusion. 3. Bibasilar airspace disease is worse on the right than left. Electronically Signed   By: CSan MorelleM.D.   On: 05/19/2020 10:47   DG CHEST PORT 1 VIEW  Result Date: 05/19/2020 CLINICAL DATA:  Sepsis, pneumonia, hemoptysis EXAM: PORTABLE CHEST 1 VIEW COMPARISON:  Portable exam 0515 hours without priors for comparison FINDINGS: Normal heart size and mediastinal contours. BILATERAL pulmonary infiltrates favor multifocal pneumonia, less likely edema Probable small RIGHT pleural effusion. No pneumothorax or acute osseous findings. Vascular stents at the axilla bilaterally. IMPRESSION: Diffuse BILATERAL pulmonary infiltrates favoring multifocal pneumonia. Electronically Signed   By: MLavonia DanaM.D.   On: 05/19/2020 08:22   Medications: Infusions: . sodium chloride    . ferric gluconate (FERRLECIT/NULECIT) IV 250 mg (05/19/20 2153)    Scheduled Medications: . sodium chloride   Intravenous Once  . Chlorhexidine Gluconate Cloth  6 each Topical  Q0600  . cinacalcet  60 mg Oral Q M,W,F  . darbepoetin (ARANESP) injection - DIALYSIS  200 mcg Intravenous Q Wed-HD  . doxercalciferol  2 mcg Intravenous Q M,W,F-HD  . mometasone-formoterol  2 puff Inhalation BID    have reviewed scheduled and prn medications.  Physical Exam: General: alert, pleasant, NAD-  occ cough Heart: RRR Lungs: CBS bilat- dec BS at bases Abdomen: soft, non tender Extremities: really no peripheral edema Dialysis Access: right AVF-  patent    05/20/2020,7:34 AM  LOS: 1 day

## 2020-05-20 NOTE — Procedures (Signed)
Patient was seen on dialysis and the procedure was supervised.  BFR 400  Via AVG BP is  149/42.   Patient appears to be tolerating treatment well  Louis Meckel 05/20/2020

## 2020-05-20 NOTE — Progress Notes (Addendum)
Progress Note    Brandon Chaney  [id number] DOB: 1955/09/16  DOA: 05/19/2020 PCP: No primary care provider on file.    Brief Narrative:     Medical records reviewed and are as summarized below:  Brandon Chaney is an 65 y.o. male with medical history significant for ESRD on hemodialysis, COPD, coronary artery disease with stents, history of zoster ophthalmicus, recent hospital admission for pneumonia and symptomatic anemia, and supplemental oxygen requirement since the recent hospitalization, representing to the ED for evaluation of chest pain, shortness of breath, and persistent cough.  Patient reports that he was admitted to [facility] roughly 2 weeks ago, treated for pneumonia, transfused 3 units of RBC, and went home with supplemental oxygen.  He continued to feel poorly, has had ongoing productive cough with sputum and small blood clots since prior to the hospitalization, and then developed chest pain and worsening shortness of breath and general malaise and fatigue over the past 2 days.  He has not noted any melena or hematochezia.  He is not sure why he required the transfusion.  Reports that he completed dialysis on 05/18/2020.  [facility] ED Course: Upon arrival to the ED, patient is found to be febrile 102.8 F, saturating 87% on 2 L/min of supplemental oxygen, tachypneic in the mid 20s, and with blood pressure 93/40.  EKG features sinus tachycardia with nonspecific IVCD and borderline ST depressions in the lateral leads.  Chest x-ray was read as bilateral upper lobe infiltrates with right greater than left effusions.  Chemistry panel features a potassium of 3.1, bicarbonate 25, and creatinine 3.20.  CBC notable for leukocytosis to 11,000 and a normocytic anemia with hemoglobin 6.2.  Lactic acid is 2.2 and procalcitonin 1.49.  Hs-CRP elevated 49.88 and high-sensitivity troponin 113.  INR was 1.3.  Covid and influenza PCR was negative.  Blood cultures were collected in  the emergency department and the patient was treated with 500 cc LR, aspirin, Rocephin, acetaminophen, and vancomycin.  He was transferred to [facility] for admission- DESPITE BEING AN ESRD PATIENT  HAVE AGAIN REQUESTED RECORDS AS WIFE REPORTS THAT HE REQUIRED TRANSFUSION FOR HEMOPTYSIS CAUSE BY A STENT IN HIS HEART BECOMING LOOSE  Addendum: under media tab there are 107 pages from [facility] that arrived on 3/1 but not reviewed by prior attending   Patient was admitted from 1/16- 1/22 and 1/26 through 2/11: He was found to have an EF of 20 to 25% down from 45 to 50%, anemia and pneumonia.  Patient was seen by cardiology and it was decided that due to his high risk of bleeding noninvasive management was to be done.  Patient was also seen by ID for Citrobacter koseri that grew on his culture after he underwent bronchoscopy.  He was treated with ceftriaxone and oral cefdinir. He also went home on O2. -P/C- ANCA negative -Quantiferon negative ? H/o RA in H&P from [facility]  PDMP reviewed: last filled 4 days of oxy in November  Assessment/Plan:   Principal Problem:   Sepsis due to pneumonia Brandon Chaney) Active Problems:   ESRD (end stage renal disease) ([place])   Acute respiratory failure with hypoxia (HCC)   Hypokalemia   Symptomatic anemia   COPD (chronic obstructive pulmonary disease) (HCC)   Elevated troponin   Chest pain   CAD (coronary artery disease)   Hemoptysis   Sepsis due to PNA; acute on chronic hypoxic respiratory failure  - Presents with CP, worsening SOB, and persistent productive  cough after admission for PNA ~2 weeks ago and is found to be febrile, tachypneic, and saturating 87% on 2 Lpm, with lactic acid 2.2 and procalcitonin 1.49  -blood and sputum culture pending -moderate right pleural effusion: thoracentesis ordered by Dr. Verlon Au  -V/Q scan not suspicious for PE -procalcitonin elevating-- may need ID Consult once cultures back and changing of his abx vs  pulm consult and another bronch -? CT Scan -has been incarcerated per chart review: negative TB test   Symptomatic anemia  - s/p transfusions, monitor -unclear etiology as he is ESRD, heme stool negative, no sign of hemolysis -was on ASA/brilenta and prior hemoptysis was thought due to this (unclear when stent was-- history from [facility] say PCI With drug eluding stent)- seems stent was in December per nephology records in care everywhere  Chest pain; elevated troponin  -Troponin marginally elevated in light of patient's end-stage renal disease -Was seen by cardiology during prior admission at [facility] and deemed too high of a risk to undergo any intervention and was to be treated medically -recent stent in DEcember??-- resume brilenta for now  Hemoptysis  - had bronch at [facility] in January/FEb admission -treated with abx -monitor  ESRD  - defer to renal -last HD 3/2  COPD  - No wheezing on admission  - ICS/LABA, as-needed albuterol    Hypokalemia  - defer to renal    Family Communication/Anticipated D/C date and plan/Code Status   DVT prophylaxis: scd Code Status: Full Code.  Family Communication: wife at bedside-- ? dementia Disposition Plan: Status is: Inpatient  Remains inpatient appropriate because:Inpatient level of care appropriate due to severity of illness   Dispo: The patient is from: Home              Anticipated d/c is to: Home              Patient currently is not medically stable to d/c.   Difficult to place patient No     Medical Consultants:    renal     Subjective:   Not able to give me his cardiologist name in [place]  Poor historian  Objective:    Vitals:   05/20/20 1100 05/20/20 1135 05/20/20 1205 05/20/20 1236  BP: 129/70 129/79  (!) 143/41  Pulse:   87 86  Resp: '16 14  18  '$ Temp:   99 F (37.2 C) 99.1 F (37.3 C)  TempSrc:   Oral Oral  SpO2:   95% 91%  Weight:   60 kg   Height:        Intake/Output Summary  (Last 24 hours) at 05/20/2020 1354 Last data filed at 05/20/2020 1205 Gross per 24 hour  Intake 159.33 ml  Output 2550 ml  Net -2390.67 ml   Filed Weights   05/19/20 1830 05/20/20 0840 05/20/20 1205  Weight: 60.6 kg 62.8 kg 60 kg    Exam:  General: Appearance:    Thin male in no acute distress- sitting on side of bed eating     Lungs:     diminished, respirations unlabored  Heart:    Normal heart rate. Normal rhythm. No murmurs, rubs, or gallops.   MS:   Has TMA  Neurologic:   Awake, alert, oriented x 3. No apparent focal neurological           defect.     Data Reviewed:   I have personally reviewed following labs and imaging studies:  Labs: Labs show the following:  Basic Metabolic Panel: Recent Labs  Lab 05/19/20 0510 05/19/20 0517 05/20/20 0312  NA 138 138 135  K 3.1* 3.1* 3.3*  CL 95* 95* 95*  CO2 '26 31 24  '$ GLUCOSE 111* 113* 170*  BUN 31* 33* 51*  CREATININE 4.06* 4.15* 5.75*  CALCIUM 7.9* 8.1* 7.5*  MG 1.9  --  2.0  PHOS  --   --  4.2   GFR Estimated Creatinine Clearance: 10.9 mL/min (A) (by C-G formula based on SCr of 5.75 mg/dL (H)). Liver Function Tests: Recent Labs  Lab 05/19/20 0510  AST 47*  ALT 29  ALKPHOS 121  BILITOT 0.9  PROT 6.8  ALBUMIN 2.7*   No results for input(s): LIPASE, AMYLASE in the last 168 hours. No results for input(s): AMMONIA in the last 168 hours. Coagulation profile No results for input(s): INR, PROTIME in the last 168 hours.  CBC: Recent Labs  Lab 05/19/20 0510 05/19/20 2201 05/20/20 0312  WBC 9.6 7.7 8.6  NEUTROABS  --  6.3 7.2  HGB 5.5* 7.8* 7.6*  HCT 18.5* 24.3* 23.2*  MCV 94.9 91.7 90.6  PLT 196 212 219   Cardiac Enzymes: No results for input(s): CKTOTAL, CKMB, CKMBINDEX, TROPONINI in the last 168 hours. BNP (last 3 results) No results for input(s): PROBNP in the last 8760 hours. CBG: No results for input(s): GLUCAP in the last 168 hours. D-Dimer: Recent Labs    05/19/20 0510  DDIMER 1.42*   Hgb  A1c: No results for input(s): HGBA1C in the last 72 hours. Lipid Profile: No results for input(s): CHOL, HDL, LDLCALC, TRIG, CHOLHDL, LDLDIRECT in the last 72 hours. Thyroid function studies: No results for input(s): TSH, T4TOTAL, T3FREE, THYROIDAB in the last 72 hours.  Invalid input(s): FREET3 Anemia work up: Recent Labs    05/19/20 0517  VITAMINB12 1,242*  FOLATE 20.0  FERRITIN >7,500*  TIBC 166*  IRON 34*  RETICCTPCT 5.3*   Sepsis Labs: Recent Labs  Lab 05/19/20 0510 05/19/20 0517 05/19/20 2201 05/20/20 0312  PROCALCITON 23.01 23.33  --  38.64  WBC 9.6  --  7.7 8.6  LATICACIDVEN 1.4  --   --   --     Microbiology Recent Results (from the past 240 hour(s))  Culture, blood (x 2)     Status: None (Preliminary result)   Collection Time: 05/19/20  5:10 AM   Specimen: BLOOD LEFT HAND  Result Value Ref Range Status   Specimen Description   Final    BLOOD LEFT HAND Performed at [facility], [place] [address], [place], [place] [address]    Special Requests   Final    BOTTLES DRAWN AEROBIC ONLY Blood Culture adequate volume Performed at [place] [address] [place], [place] [address]    Culture   Final    NO GROWTH 1 DAY Performed at [facility], [place] [address] [place], [place] [address]    Report Status PENDING  Incomplete  Culture, blood (x 2)     Status: None (Preliminary result)   Collection Time: 05/19/20  5:23 AM   Specimen: BLOOD  Result Value Ref Range Status   Specimen Description   Final    BLOOD LEFT ANTECUBITAL Performed at [place] [address], [place], [place] [address]    Special Requests   Final    BOTTLES DRAWN AEROBIC AND ANAEROBIC Blood Culture adequate volume Performed at [facility] [place] [address], [place], [place] [address]  Culture   Final    NO GROWTH 1 DAY Performed at [facility], [place] [address], [place], [place] [address]     Report Status PENDING  Incomplete  Culture, sputum-assessment     Status: None   Collection Time: 05/19/20 10:07 AM   Specimen: Sputum  Result Value Ref Range Status   Specimen Description SPUTUM  Final   Special Requests NONE  Final   Sputum evaluation   Final    THIS SPECIMEN IS ACCEPTABLE FOR SPUTUM CULTURE Performed at [facility], [place] [address] [place], [place] [address]    Report Status 05/19/2020 FINAL  Final  Culture, Respiratory w Gram Stain     Status: None (Preliminary result)   Collection Time: 05/19/20 10:07 AM   Specimen: SPU  Result Value Ref Range Status   Specimen Description   Final    SPUTUM Performed at [place] [address], [place], [place] [address]    Special Requests   Final    NONE Reflexed from [phone] Performed at [place] [address] [place], [place] [address]    Gram Stain   Final    RARE WBC PRESENT, PREDOMINANTLY MONONUCLEAR FEW SQUAMOUS EPITHELIAL CELLS PRESENT NO ORGANISMS SEEN    Culture   Final    CULTURE REINCUBATED FOR BETTER GROWTH Performed at [facility], [place] [address] [place], [place] [address]    Report Status PENDING  Incomplete  SARS CORONAVIRUS 2 (TAT 6-24 HRS) Nasopharyngeal Nasopharyngeal Swab     Status: None   Collection Time: 05/19/20 11:24 AM   Specimen: Nasopharyngeal Swab  Result Value Ref Range Status   SARS Coronavirus 2 NEGATIVE NEGATIVE Final    Comment: (NOTE) SARS-CoV-2 target nucleic acids are NOT DETECTED.  The SARS-CoV-2 RNA is generally detectable in upper and lower respiratory specimens during the acute phase of infection. Negative results do not preclude SARS-CoV-2 infection, do not rule out co-infections with other pathogens, and should not be used as the sole basis for treatment or other patient management decisions. Negative results must be combined with clinical observations, patient history, and epidemiological  information. The expected result is Negative.  Fact Sheet for Patients: SugarRoll.be  Fact Sheet for Healthcare Providers: https://www.woods-mathews.com/  This test is not yet approved or cleared by the [place] FDA and  has been authorized for detection and/or diagnosis of SARS-CoV-2 by FDA under an Emergency Use Authorization (EUA). This EUA will remain  in effect (meaning this test can be used) for the duration of the COVID-19 declaration under Se ction 564(b)(1) of the Act, 21 U.S.C. section 360bbb-3(b)(1), unless the authorization is terminated or revoked sooner.  Performed at [facility], [place] [address], [place], [place] [address]   MRSA PCR Screening     Status: None   Collection Time: 05/19/20  5:59 PM   Specimen: Nasal Mucosa; Nasopharyngeal  Result Value Ref Range Status   MRSA by PCR NEGATIVE NEGATIVE Final    Comment:        The GeneXpert MRSA Assay (FDA approved for NASAL specimens only), is one component of a comprehensive MRSA colonization surveillance program. It is not intended to diagnose MRSA infection nor to guide or monitor treatment for MRSA infections. Performed at [facility], [place] [address], [place], [place] [address]     Procedures and diagnostic studies:  NM Pulmonary Perfusion  Result Date: 05/19/2020 CLINICAL DATA:  Chest pain, shortness of  breath and elevated D-dimer. The patient has pneumonia. EXAM: NUCLEAR MEDICINE PERFUSION LUNG SCAN TECHNIQUE: Perfusion images were obtained in multiple projections after intravenous injection of radiopharmaceutical. Ventilation scans intentionally deferred if perfusion scan and chest x-ray adequate for interpretation during COVID 19 epidemic. RADIOPHARMACEUTICALS:  4.4 mCi Tc-86mMAA IV COMPARISON:  Single-view of the chest earlier today. FINDINGS: Small nonsegmental defects are seen in the upper lobes bilaterally. Defect on the right corresponds with  finding on chest film. No definite correlation with defect on the left is seen. Perfusion is otherwise normal. IMPRESSION: Small nonsegmental defects in the upper lobes bilaterally are likely related to the patient's pneumonia rather than pulmonary embolus. Electronically Signed   By: TInge RiseM.D.   On: 05/19/2020 14:43   DG CHEST PORT 1 VIEW  Result Date: 05/19/2020 CLINICAL DATA:  Pneumonia. EXAM: PORTABLE CHEST 1 VIEW COMPARISON:  One-view chest x-ray 05/19/2020 FINDINGS: Heart is enlarged. Atherosclerotic calcifications are present. Diffuse interstitial pattern is again seen. Moderate right pleural effusion present. No pneumothorax. Bibasilar airspace disease is worse right than left. IMPRESSION: 1. Cardiomegaly with diffuse interstitial pattern compatible with edema. Infection is not excluded. Patchy opacities seen on the prior study are less prominent. Pattern is more diffuse. 2. Moderate right pleural effusion. 3. Bibasilar airspace disease is worse on the right than left. Electronically Signed   By: CSan MorelleM.D.   On: 05/19/2020 10:47   DG CHEST PORT 1 VIEW  Result Date: 05/19/2020 CLINICAL DATA:  Sepsis, pneumonia, hemoptysis EXAM: PORTABLE CHEST 1 VIEW COMPARISON:  Portable exam 0515 hours without priors for comparison FINDINGS: Normal heart size and mediastinal contours. BILATERAL pulmonary infiltrates favor multifocal pneumonia, less likely edema Probable small RIGHT pleural effusion. No pneumothorax or acute osseous findings. Vascular stents at the axilla bilaterally. IMPRESSION: Diffuse BILATERAL pulmonary infiltrates favoring multifocal pneumonia. Electronically Signed   By: MLavonia DanaM.D.   On: 05/19/2020 08:22    Medications:   . sodium chloride   Intravenous Once  . Chlorhexidine Gluconate Cloth  6 each Topical Q0600  . cinacalcet  60 mg Oral Q M,W,F  . Darbepoetin Alfa      . darbepoetin (ARANESP) injection - DIALYSIS  200 mcg Intravenous Q Wed-HD  .  doxercalciferol      . doxercalciferol  2 mcg Intravenous Q M,W,F-HD  . mometasone-formoterol  2 puff Inhalation BID   Continuous Infusions: . sodium chloride    . ceFEPime (MAXIPIME) IV    . ferric gluconate (FERRLECIT/NULECIT) IV 250 mg (05/19/20 2153)  . [START ON 05/22/2020] vancomycin       LOS: 1 day   JGeradine Girt [facility]   How to contact the TOphthalmology Surgery Center Of Dallas LLCAttending or Consulting provider [place] covering provider during after hours [place] for this patient?  1. Check the care team in [facility] Centerand look for a) attending/consulting TRH provider listed and b) the TManning Regional Healthcareteam listed 2. Log into [email or web address] and use [place]'s universal password to access. If you do not have the password, please contact the hospital operator. 3. Locate the TRoanoke [facility] Center For Sight LLCprovider you are looking for under [facility] and page to a number that you can be directly reached. 4. If you still have difficulty reaching the provider, please page the [facility]Str[facility](Director on Call) for the Hospitalists listed on amion for assistance.  05/20/2020, 1:54 PM

## 2020-05-20 NOTE — Progress Notes (Signed)
Initial Nutrition Assessment  DOCUMENTATION CODES:   Severe malnutrition in context of chronic illness  INTERVENTION:   -Rena-Vite po daily  -Nepro Carb with Steady BID, each supplement provides 420 kcal, 19 grams protein  NUTRITION DIAGNOSIS:   Severe Malnutrition related to chronic illness (ESRD on HD, COPD) as evidenced by severe fat depletion,severe muscle depletion,moderate fat depletion,moderate muscle depletion.  GOAL:   Patient will meet greater than or equal to 90% of their needs  MONITOR:   PO intake,Supplement acceptance,Skin,Labs,Weight trends  REASON FOR ASSESSMENT:   Malnutrition Screening Tool    ASSESSMENT:   2 YOM admitted for sepsis due to pneumonia. PMH of ESRD on HD via AV graft, CKD MBD, COPD, CAD s/p arteriovenous graft s/p coronary angioplasty, herpes zoster ophthalmicus (right eye). Recent hospital admit (~2 weeks ago) for pne and symptomatic anemia, supplement oxygen req since recent hospitalization.   3/02 -dialysis  3/03 - IR thoracentesis   Per chart documented meal completion, pt has been consuming 0% of his meals. Spoke with wife while pt was receiving thoracentesis, then with pt once he returned. Pt's wife mentioned that pt has had a good appetite since being admitted. Pt's wife reported that pt is currently consuming approximately 50-75% of his tray. Wife reported that pt typically has a good appetite, however has had a poor appetite for the past few weeks since his last hospital stay (2 weeks prior). She mentioned that the pt typically consumes:  Dialysis Days  Breakfast - breakfast sandwich (egg, [place] sausage and cheese) and a cup of fruit  Snacks - piece of candy, single serve cup of peanut butter Dinner - baked chicken or fish or hamburger steak, rice, vegetables  Dessert - brownie or cookie or pudding  Non-Dialysis Days Breakfast - Nepro shake and sometimes breakfast sandwich (egg, [place] sausage and cheese) Lunch - mixed vegetables  plate (kale, collards, etc) with baked chicken or baked fish and rice  Dinner - similar to lunch but smaller amount  Pt reports that he drinks 1 Nepro per day when he is eating well, but 1-2 Nepro a day when he has a poor appetite. Pt reports that he has been eating his normal intake, but smaller portions for the past few weeks while he's had a poor appetite. Pt reports that he does take his binders at home prior to each meal. Pt mentioned that he previously had insulin-dependent diabetes, however mentioned that he has not been taking any insulin for over a year due to lifestyle changes.  Per records, pt's EDW is 61.5 kg. Pt reports that his UBW is 154#, however in the past month and a half he has lost 20# and currently weighs 134#. Per chart, post HD pt weighed 132.28#.   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   Meds reviewed: sensipar (MWF), hectorol (MWF) Labs reviewed: potassium (3.3 low), corrected calcium (8.54)  NUTRITION - FOCUSED PHYSICAL EXAM:  Flowsheet Row Most Recent Value  Orbital Region Moderate depletion  Upper Arm Region Severe depletion  Thoracic and Lumbar Region Severe depletion  Buccal Region Moderate depletion  Temple Region Moderate depletion  Clavicle Bone Region Severe depletion  Clavicle and Acromion Bone Region Severe depletion  Scapular Bone Region Moderate depletion  Dorsal Hand Moderate depletion  Patellar Region Severe depletion  Anterior Thigh Region Severe depletion  Posterior Calf Region Severe depletion  Edema (RD Assessment) None  Hair Reviewed  Eyes Reviewed  Mouth Reviewed  Skin Reviewed  Nails Reviewed       Diet Order:   Diet Order            Diet renal with fluid restriction Fluid restriction: 1200 mL Fluid; Room service appropriate? Yes; Fluid consistency: Thin  Diet effective now                 EDUCATION NEEDS:   No education needs have been  identified at this time  Skin:  Skin Assessment: Reviewed RN Assessment  Last BM:  2/28  Height:   Ht Readings from Last 1 Encounters:  05/19/20 '5\' 9"'$  (1.753 m)    Weight:   Wt Readings from Last 1 Encounters:  05/20/20 60 kg    Ideal Body Weight:  72.7 kg  BMI:  Body mass index is 19.53 kg/m.  Estimated Nutritional Needs:   Kcal:  2000-2200  Protein:  100-115 g  Fluid:  1.2 L    Salvadore Oxford, Dietetic Intern 05/20/2020 4:36 PM

## 2020-05-20 NOTE — Procedures (Signed)
PROCEDURE SUMMARY:  Successful image-guided right thoracentesis. Yielded 1.3L of clear yellow fluid. Pt tolerated procedure well. No immediate complications. EBL = trace   Specimen was sent for labs. CXR ordered.  Please see imaging section of Epic for full dictation.  Armando Gang Kissy Cielo PA-C 05/20/2020 3:16 PM

## 2020-05-20 NOTE — Progress Notes (Signed)
Was able to pull in records from [place].  Initially was not able to see records AS patient was NOT registered correctly.  I updated his address and now records are available in [place]. JV

## 2020-05-20 NOTE — Progress Notes (Signed)
Pt with coughing episode followed by CP. Pt on 4L [place]. EKG completed.v/s 99.1-76-20-145/50-95% on 4L [place]. MD made aware. PRN for cough given with effective relief noted. Labs ordered. Pt refused lab draw multiple times. Confirmed with IV team regarding pulling labs off EJ IV. Labs obtained. Will continue to monitor

## 2020-05-20 NOTE — Progress Notes (Signed)
Pharmacy Antibiotic Note  Brandon Chaney is a 65 y.o. male admitted on 05/19/2020 with pneumonia.  PMH significant for ESRD on HD, COPD, CAD, recent hospitalization for pneumonia.  Patient transferred from [facility] center, where H&P states patient received Ceftriaxone, Azithromycin and Vancomycin.  Spoke with current RN who confirmed Vancomycin was not given at outside facility (medication was sent with patient but not yet administered).  Last HD noted on 05/18/20.  Pharmacy has been consulted for Vancomycin and Cefepime dosing.  Plan: - Vancomycin 500 mg after HD on 3/2 - Start Vancomycin 750 mg/HD-MWF starting on 3/4 - MRSA PCR neg - f/u ability to d/c Vancomycin - Cont Cefepime at a dose of 1g/24h - Will continue to follow HD schedule/duration, culture results, LOT, and antibiotic de-escalation plans   Height: '5\' 9"'$  (175.3 cm) Weight: 62.8 kg (138 lb 7.2 oz) IBW/kg (Calculated) : 70.7  Temp (24hrs), Avg:98.6 F (37 C), Min:97.8 F (36.6 C), Max:99.3 F (37.4 C)  Recent Labs  Lab 05/19/20 0510 05/19/20 0517 05/19/20 2201 05/20/20 0312  WBC 9.6  --  7.7 8.6  CREATININE 4.06* 4.15*  --  5.75*  LATICACIDVEN 1.4  --   --   --     Estimated Creatinine Clearance: 11.4 mL/min (A) (by C-G formula based on SCr of 5.75 mg/dL (H)).    Allergies  Allergen Reactions  . Duragesic-100 [Fentanyl] Nausea Only  . Sulfa Antibiotics     Heart flutters/itching    3/1 Cefepime >>   3/1 Vanc >>    3/1 BCx x2 >> 3/1 RCx >> 3/1 MRSA PCR >> neg  Thank you for allowing pharmacy to be a part of this patient's care.  Alycia Rossetti, PharmD, BCPS Clinical Pharmacist Clinical phone for 05/20/2020: [phone] 05/20/2020 10:21 AM   **Pharmacist phone directory can now be found on [email or web address] (PW [email or web address]).  Listed under [place].

## 2020-05-21 DIAGNOSIS — A419 Sepsis, unspecified organism: Secondary | ICD-10-CM | POA: Diagnosis not present

## 2020-05-21 DIAGNOSIS — J9601 Acute respiratory failure with hypoxia: Secondary | ICD-10-CM | POA: Diagnosis not present

## 2020-05-21 DIAGNOSIS — R042 Hemoptysis: Secondary | ICD-10-CM

## 2020-05-21 DIAGNOSIS — E43 Unspecified severe protein-calorie malnutrition: Secondary | ICD-10-CM | POA: Insufficient documentation

## 2020-05-21 DIAGNOSIS — I25119 Atherosclerotic heart disease of native coronary artery with unspecified angina pectoris: Secondary | ICD-10-CM

## 2020-05-21 DIAGNOSIS — J189 Pneumonia, unspecified organism: Secondary | ICD-10-CM

## 2020-05-21 DIAGNOSIS — Y95 Nosocomial condition: Secondary | ICD-10-CM

## 2020-05-21 LAB — LEGIONELLA PNEUMOPHILA SEROGP 1 UR AG: L. pneumophila Serogp 1 Ur Ag: NEGATIVE

## 2020-05-21 LAB — CBC WITH DIFFERENTIAL/PLATELET
Abs Immature Granulocytes: 0.05 10*3/uL (ref 0.00–0.07)
Abs Immature Granulocytes: 0.06 10*3/uL (ref 0.00–0.07)
Basophils Absolute: 0.1 10*3/uL (ref 0.0–0.1)
Basophils Absolute: 0.1 10*3/uL (ref 0.0–0.1)
Basophils Relative: 1 %
Basophils Relative: 1 %
Eosinophils Absolute: 0.2 10*3/uL (ref 0.0–0.5)
Eosinophils Absolute: 0.2 10*3/uL (ref 0.0–0.5)
Eosinophils Relative: 2 %
Eosinophils Relative: 3 %
HCT: 26.4 % — ABNORMAL LOW (ref 39.0–52.0)
HCT: 28.3 % — ABNORMAL LOW (ref 39.0–52.0)
Hemoglobin: 8.2 g/dL — ABNORMAL LOW (ref 13.0–17.0)
Hemoglobin: 8.6 g/dL — ABNORMAL LOW (ref 13.0–17.0)
Immature Granulocytes: 1 %
Immature Granulocytes: 1 %
Lymphocytes Relative: 11 %
Lymphocytes Relative: 13 %
Lymphs Abs: 0.9 10*3/uL (ref 0.7–4.0)
Lymphs Abs: 1 10*3/uL (ref 0.7–4.0)
MCH: 28.8 pg (ref 26.0–34.0)
MCH: 28.9 pg (ref 26.0–34.0)
MCHC: 31.1 g/dL (ref 30.0–36.0)
MCHC: 30.4 g/dL (ref 30.0–36.0)
MCV: 92.6 fL (ref 80.0–100.0)
MCV: 95 fL (ref 80.0–100.0)
Monocytes Absolute: 0.3 10*3/uL (ref 0.1–1.0)
Monocytes Absolute: 0.3 10*3/uL (ref 0.1–1.0)
Monocytes Relative: 4 %
Monocytes Relative: 4 %
Neutro Abs: 6.6 10*3/uL (ref 1.7–7.7)
Neutro Abs: 6.1 10*3/uL (ref 1.7–7.7)
Neutrophils Relative %: 81 %
Neutrophils Relative %: 78 %
Platelets: 198 10*3/uL (ref 150–400)
Platelets: 223 10*3/uL (ref 150–400)
RBC: 2.85 MIL/uL — ABNORMAL LOW (ref 4.22–5.81)
RBC: 2.98 MIL/uL — ABNORMAL LOW (ref 4.22–5.81)
RDW: 18.1 % — ABNORMAL HIGH (ref 11.5–15.5)
RDW: 18 % — ABNORMAL HIGH (ref 11.5–15.5)
WBC: 8.1 10*3/uL (ref 4.0–10.5)
WBC: 7.6 10*3/uL (ref 4.0–10.5)
nRBC: 0.7 % — ABNORMAL HIGH (ref 0.0–0.2)
nRBC: 1.4 % — ABNORMAL HIGH (ref 0.0–0.2)

## 2020-05-21 LAB — BASIC METABOLIC PANEL
Anion gap: 13 (ref 5–15)
BUN: 28 mg/dL — ABNORMAL HIGH (ref 8–23)
CO2: 24 mmol/L (ref 22–32)
Calcium: 8 mg/dL — ABNORMAL LOW (ref 8.9–10.3)
Chloride: 99 mmol/L (ref 98–111)
Creatinine, Ser: 4.37 mg/dL — ABNORMAL HIGH (ref 0.61–1.24)
GFR, Estimated: 14 mL/min — ABNORMAL LOW (ref >60)
Glucose, Bld: 192 mg/dL — ABNORMAL HIGH (ref 70–99)
Potassium: 3.2 mmol/L — ABNORMAL LOW (ref 3.5–5.1)
Sodium: 136 mmol/L (ref 135–145)

## 2020-05-21 LAB — URINE CULTURE: Culture: <10000 — AB

## 2020-05-21 LAB — ACID FAST SMEAR (AFB, MYCOBACTERIA): Acid Fast Smear: NEGATIVE

## 2020-05-21 LAB — PATHOLOGIST SMEAR REVIEW

## 2020-05-21 LAB — CULTURE, RESPIRATORY W GRAM STAIN: Culture: NORMAL

## 2020-05-21 LAB — PHOSPHORUS: Phosphorus: 2.7 mg/dL (ref 2.5–4.6)

## 2020-05-21 LAB — MAGNESIUM: Magnesium: 1.9 mg/dL (ref 1.7–2.4)

## 2020-05-21 MED ORDER — CHLORHEXIDINE GLUCONATE CLOTH 2 % EX PADS
6.0000 | MEDICATED_PAD | Freq: Every day | CUTANEOUS | Status: DC
  Administered 2020-05-23 – 2020-05-30 (×6): 6 via TOPICAL

## 2020-05-21 MED ORDER — DIPHENHYDRAMINE HCL 50 MG/ML IJ SOLN
12.5000 mg | Freq: Three times a day (TID) | INTRAMUSCULAR | Status: DC | PRN
  Administered 2020-05-21 – 2020-05-26 (×8): 12.5 mg via INTRAVENOUS
  Filled 2020-05-21 (×8): qty 1

## 2020-05-21 NOTE — Consult Note (Signed)
NAME:  Brandon Chaney, MRN:  [id number], DOB:  06-26-1955, LOS: 2 ADMISSION DATE:  05/19/2020, CONSULTATION DATE:  05/21/20 REFERRING MD:  Maren Beach - TRH, CHIEF COMPLAINT:  Hemoptysis   Brief History:  65 yo persistent small volume hemoptysis since December 2021. Repeatedly tx for PNA -- recent admit to [facility] for citrobacter koseri   History of Present Illness:  65 yo M PMH HFrEF, ESRD, COPD, CAD, anemia who presented to [facility] ED 3/122 with chest pain SOB cough and hemoptysis. Recent hospitalization for PNA (citrobacater koseri) and symptomatic anemia requiring transfusion and was discharged on O2. Symptoms have not improved since discharge, and pt endorses small volume hemoptysis since December 2021.  In ED, CXR revealed bilateral upper lobe opacities, pt started on rocephin and vanc. Ad[facility] to [facility]. Transferred to [facility] due to iHD need. Underwent Thora with IR 3/2 with 1.3L removed.  PCCM consulted 3/3 for hemoptysis evaluation   Past Medical History:  HFrEF  ESRD COPD CAD Zoster Ophthalmicus  PNA Anemia   Significant Hospital Events:  3/1 admitted to [facility] 3/2 transfer to [facility] [place] with IR 3/3 CCM consult for persistent hemoptysis   Consults:  IR CCM  Procedures:  3/2 R Thora> 1.3L off   Significant Diagnostic Tests:  3/1 NM pulm perfusion> small nonsegmetal defects, felt to be r/t PNA not PE 3/2 CXR> Bilateral infiltrates and edema.   Micro Data:  3/1 sputum> rare WBC, few squamous cells, no orgs  3/1 covid negative, flu a/b negative   Antimicrobials:  Vanc  Cefepime  Interim History / Subjective:  Family requests second opinion of hemoptysis  Objective   Blood pressure (!) 132/42, pulse 84, temperature 98.5 F (36.9 C), temperature source Oral, resp. rate 15, height '5\' 9"'$  (1.753 m), weight 57.6 kg, SpO2 99 %.        Intake/Output Summary (Last 24 hours) at 05/21/2020 1346 Last data filed at 05/21/2020 0800 Gross per 24 hour  Intake  870.14 ml  Output 1300 ml  Net -429.86 ml   Filed Weights   05/20/20 0840 05/20/20 1205 05/21/20 0457  Weight: 62.8 kg 60 kg 57.6 kg    Examination: General: Chronically ill appearing older adult M, appears older than stated age. Reclined in bed NAD HENT: [place]. Missing teeth. Trachea midline Lungs: Scattered crackles. Diminished bibasilar sounds.  Cardiovascular: rrr cap refill < 3 sec  Abdomen: soft  thin + bowel sounds  Extremities: L AVG. No acute deformity. Lower extremity s/p metatarsal amputation. No peripheral edema Neuro: AAOx4 following commands GU: defer   Resolved Hospital Problem list     Assessment & Plan:   Respiratory failure with hypoxia -recently discharged on O2 PNA -recent hx citrobacter koseri at [facility] R pleural effusion, s/p thoracentesis 3/2 with IR - improved L pleural effusion Hemoptysis  -suspect this is r/t cough + ASA/Brillinta  -NM pulm perfusion not suggestive of PE COPD P -cont vanc cefepime -pulm hygiene, IS  -CT chest -- evaluating for possible indication for bronch (on schedule for 3/4 AM in case he does require it, NPO at midnight) -incruse dulera PRN albuterol  ESRD  - HD per nephro  CAD -recent stent placement HFrEF HTN -cont brillinta -wonder if at some point this admission pt will need cath?  Anemia -recently required PRBC -suspect this is r/t chronic disease -cont aranesp, nulecit  -trend   Best practice (evaluated daily)  Diet: NPO at [place] of Care:  Code Status: Full  Labs   CBC: Recent Labs  Lab 05/19/20 0510 05/19/20 2201 05/20/20 0312 05/21/20 0120  WBC 9.6 7.7 8.6 8.1  NEUTROABS  --  6.3 7.2 6.6  HGB 5.5* 7.8* 7.6* 8.2*  HCT 18.5* 24.3* 23.2* 26.4*  MCV 94.9 91.7 90.6 92.6  PLT 196 212 219 [id number]    Basic Metabolic Panel: Recent Labs  Lab 05/19/20 0510 05/19/20 0517 05/20/20 0312 05/21/20 0120  NA 138 138 135 136  K 3.1* 3.1* 3.3* 3.2*  CL 95* 95* 95* 99  CO2 '26 31 24 24   '$ GLUCOSE 111* 113* 170* 192*  BUN 31* 33* 51* 28*  CREATININE 4.06* 4.15* 5.75* 4.37*  CALCIUM 7.9* 8.1* 7.5* 8.0*  MG 1.9  --  2.0 1.9  PHOS  --   --  4.2 2.7   GFR: Estimated Creatinine Clearance: 13.7 mL/min (A) (by C-G formula based on SCr of 4.37 mg/dL (H)). Recent Labs  Lab 05/19/20 0510 05/19/20 0517 05/19/20 2201 05/20/20 0312 05/21/20 0120  PROCALCITON 23.01 23.33  --  38.64  --   WBC 9.6  --  7.7 8.6 8.1  LATICACIDVEN 1.4  --   --   --   --     Liver Function Tests: Recent Labs  Lab 05/19/20 0510  AST 47*  ALT 29  ALKPHOS 121  BILITOT 0.9  PROT 6.8  ALBUMIN 2.7*   No results for input(s): LIPASE, AMYLASE in the last 168 hours. No results for input(s): AMMONIA in the last 168 hours.  ABG No results found for: PHART, PCO2ART, PO2ART, HCO3, TCO2, ACIDBASEDEF, O2SAT   Coagulation Profile: No results for input(s): INR, PROTIME in the last 168 hours.  Cardiac Enzymes: No results for input(s): CKTOTAL, CKMB, CKMBINDEX, TROPONINI in the last 168 hours.  HbA1C: No results found for: HGBA1C  CBG: No results for input(s): GLUCAP in the last 168 hours.  Review of Systems:   Full ROS performed. Positives include:  SOB, cough, hemoptysis, malaise, weakness, sadness, DOE  Past Medical History:  He,  has a past medical history of CAD (coronary artery disease) (05/19/2020), COPD (chronic obstructive pulmonary disease) ([place]) (05/19/2020), ESRD (end stage renal disease) ([place]) (05/19/2020), and Herpes zoster ophthalmicus, right eye.   Surgical History:   Past Surgical History:  Procedure Laterality Date  . ARTERIOVENOUS GRAFT PLACEMENT Right   . CORONARY ANGIOPLASTY    . IR THORACENTESIS ASP PLEURAL SPACE W/IMG GUIDE  05/20/2020     Social History:   reports that he has never smoked. He has never used smokeless tobacco. He reports previous alcohol use. He reports previous drug use.   Family History:  His Family history is unknown by patient.    Allergies Allergies  Allergen Reactions  . Duragesic-100 [Fentanyl] Nausea Only  . Sulfa Antibiotics     Heart flutters/itching     Home Medications  Prior to Admission medications   Medication Sig Start Date End Date Taking? Authorizing Provider  acidophilus (RISAQUAD) CAPS capsule Take 1 capsule by mouth daily.   Yes [provider]  albuterol (PROVENTIL) (2.5 MG/3ML) 0.083% nebulizer solution Take 2.5 mg by nebulization every 6 (six) hours as needed for wheezing or shortness of breath.   Yes [provider]  aspirin EC 81 MG tablet Take 81 mg by mouth daily. Swallow whole.   Yes [provider]  atorvastatin (LIPITOR) 80 MG tablet Take 80 mg by mouth daily.   Yes [provider]  cinacalcet (SENSIPAR) 30 MG  tablet Take 30 mg by mouth daily.   Yes [provider]  diltiazem (DILACOR XR) 240 MG 24 hr capsule Take 240 mg by mouth daily.   Yes [provider]  diphenhydrAMINE (BENADRYL) 25 mg capsule Take 25 mg by mouth every 6 (six) hours as needed.   Yes [provider]  famotidine (PEPCID) 20 MG tablet Take 20 mg by mouth See admin instructions. Takes 1 tablet 3 times a week   Yes [provider]  ferrous sulfate 325 (65 FE) MG tablet Take 325 mg by mouth daily with breakfast.   Yes [provider]  lisinopril (ZESTRIL) 10 MG tablet Take 10 mg by mouth daily.   Yes [provider]  sodium bicarbonate 650 MG tablet Take 650 mg by mouth 4 (four) times daily.   Yes [provider]  tamsulosin (FLOMAX) 0.4 MG CAPS capsule Take 0.4 mg by mouth daily.   Yes [provider]  ticagrelor (BRILINTA) 90 MG TABS tablet Take 90 mg by mouth 2 (two) times daily.   Yes [provider]  tiotropium (SPIRIVA) 18 MCG inhalation capsule Place 18 mcg into inhaler and inhale daily.   Yes [provider]     Critical care time: n/a    Eliseo Gum MSN, AGACNP-BC [place] [id number] If no answer, [id number] 05/21/2020, 3:00 PM

## 2020-05-21 NOTE — Progress Notes (Signed)
Subjective:  HD yest- removed 2500-  Then had thoracentesis-  Removed 1.3 liters - hgb up to 8.2 this AM-  He feels better     Objective Vital signs in last 24 hours: Vitals:   05/20/20 2343 05/21/20 0213 05/21/20 0457 05/21/20 0723  BP: (!) 128/45  (!) 149/52 (!) 132/42  Pulse: 82  84 84  Resp: '18 14 17 15  '$ Temp: 98.6 F (37 C)  99.2 F (37.3 C) 98.5 F (36.9 C)  TempSrc: Oral  Oral Oral  SpO2: 95% 98% 94% 93%  Weight:   57.6 kg   Height:       Weight change: 2.199 kg  Intake/Output Summary (Last 24 hours) at 05/21/2020 0752 Last data filed at 05/21/2020 0300 Gross per 24 hour  Intake 630.14 ml  Output 3800 ml  Net -3169.86 ml   Dialyzes at [employer]-  mwf  - 3 hours and 30 minutes EDW 61.5. HD Bath unclear, Dialyzer 180, Heparin no. Access left AVG. sensipar 61 q HD, hectorol 2 q tx Last hgb 6.9 on 2/21  Assessment/Plan: 65 year old BM with ESRD and COPD-  Recent hosp for PNA remains SOB-  Very anemic and with pleural effusion 1 SOB-  Likely multifactorial.  Has history of COPD.  Just got over pneumonia so could have some residual from that or some deconditioning.  Now the added impact of being very anemic and also having a pleural effusion.  s/p thoracentesis.   Previous fever but not outrageous white blood count.  Given empiric antibiotics.  Will UF as able with HD   2 ESRD: Normally Monday Wednesday Friday via AV graft.  Received full treatment on Monday,  Wednesday.  We will try to ultrafilter as able to help with pleural effusion. 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  However, anemia seems out of proportion to just ESRD.  Got partial transfusion Tuesday.   will give IV iron and high dose ESA to assist as well.  Most certainly contributing to his feeling of shortness of  breath- is better today  5. Metabolic Bone Disease: We will continue his home doses of Hectorol, Sensipar - hold phoslo for now as he does not seem to be eating much and phos is OK   Louis Meckel    Labs: Basic Metabolic Panel: Recent Labs  Lab 05/19/20 0517 05/20/20 0312 05/21/20 0120  NA 138 135 136  K 3.1* 3.3* 3.2*  CL 95* 95* 99  CO2 '31 24 24  '$ GLUCOSE 113* 170* 192*  BUN 33* 51* 28*  CREATININE 4.15* 5.75* 4.37*  CALCIUM 8.1* 7.5* 8.0*  PHOS  --  4.2 2.7   Liver Function Tests: Recent Labs  Lab 05/19/20 0510  AST 47*  ALT 29  ALKPHOS 121  BILITOT 0.9  PROT 6.8  ALBUMIN 2.7*   No results for input(s): LIPASE, AMYLASE in the last 168 hours. No results for input(s): AMMONIA in the last 168 hours. CBC: Recent Labs  Lab 05/19/20 0510 05/19/20 2201 05/20/20 0312 05/21/20 0120  WBC 9.6 7.7 8.6 8.1  NEUTROABS  --  6.3 7.2 6.6  HGB 5.5* 7.8* 7.6* 8.2*  HCT 18.5* 24.3* 23.2* 26.4*  MCV 94.9 91.7 90.6 92.6  PLT 196 212 219 198  Cardiac Enzymes: No results for input(s): CKTOTAL, CKMB, CKMBINDEX, TROPONINI in the last 168 hours. CBG: No results for input(s): GLUCAP in the last 168 hours.  Iron Studies:  Recent Labs    05/19/20 0517  IRON 34*  TIBC 166*  FERRITIN >7,500*   Studies/Results: DG Chest 1 View  Result Date: 05/20/2020 CLINICAL DATA:  Post right thoracentesis. EXAM: CHEST  1 VIEW COMPARISON:  05/19/2020. FINDINGS: Heart size stable. Diffuse bilateral pulmonary infiltrates/edema with slight interim improvement from prior exam. Improvement right pleural effusion. Stable small left pleural effusion. No pneumothorax post thoracentesis. Bilateral upper extremity stents noted. Peripheral vascular calcification. IMPRESSION: 1. No pneumothorax post thoracentesis. 2. Diffuse bilateral pulmonary infiltrates/edema with slight interim improvement from prior exam. Improvement and right pleural effusion. Stable small left pleural effusion.  Electronically Signed   By: Marcello Moores  Register   On: 05/20/2020 15:42   NM Pulmonary Perfusion  Result Date: 05/19/2020 CLINICAL DATA:  Chest pain, shortness of breath and elevated D-dimer. The patient has pneumonia. EXAM: NUCLEAR MEDICINE PERFUSION LUNG SCAN TECHNIQUE: Perfusion images were obtained in multiple projections after intravenous injection of radiopharmaceutical. Ventilation scans intentionally deferred if perfusion scan and chest x-ray adequate for interpretation during COVID 19 epidemic. RADIOPHARMACEUTICALS:  4.4 mCi Tc-40mMAA IV COMPARISON:  Single-view of the chest earlier today. FINDINGS: Small nonsegmental defects are seen in the upper lobes bilaterally. Defect on the right corresponds with finding on chest film. No definite correlation with defect on the left is seen. Perfusion is otherwise normal. IMPRESSION: Small nonsegmental defects in the upper lobes bilaterally are likely related to the patient's pneumonia rather than pulmonary embolus. Electronically Signed   By: TInge RiseM.D.   On: 05/19/2020 14:43   DG CHEST PORT 1 VIEW  Result Date: 05/19/2020 CLINICAL DATA:  Pneumonia. EXAM: PORTABLE CHEST 1 VIEW COMPARISON:  One-view chest x-ray 05/19/2020 FINDINGS: Heart is enlarged. Atherosclerotic calcifications are present. Diffuse interstitial pattern is again seen. Moderate right pleural effusion present. No pneumothorax. Bibasilar airspace disease is worse right than left. IMPRESSION: 1. Cardiomegaly with diffuse interstitial pattern compatible with edema. Infection is not excluded. Patchy opacities seen on the prior study are less prominent. Pattern is more diffuse. 2. Moderate right pleural effusion. 3. Bibasilar airspace disease is worse on the right than left. Electronically Signed   By: CSan MorelleM.D.   On: 05/19/2020 10:47   IR THORACENTESIS ASP PLEURAL SPACE W/IMG GUIDE  Result Date: 05/20/2020 INDICATION: Shortness of breath. Right pleural effusions. Request  made for therapeutic and diagnostic thoracentesis. EXAM: ULTRASOUND GUIDED RIGHT THORACENTESIS MEDICATIONS: 10 mL 1% lidocaine COMPLICATIONS: None immediate. PROCEDURE: An ultrasound guided thoracentesis was thoroughly discussed with the patient and questions answered. The benefits, risks, alternatives and complications were also discussed. The patient understands and wishes to proceed with the procedure. Written consent was obtained. Ultrasound was performed to localize and mark an adequate pocket of fluid in the right chest. The area was then prepped and draped in the normal sterile fashion. 1% Lidocaine was used for local anesthesia. Under ultrasound guidance a 6 Fr Safe-T-Centesis catheter was introduced. Thoracentesis was performed. The catheter was removed and a dressing applied. FINDINGS: A total of approximately 1.3 L of clear yellow fluid was removed. Samples were sent to the laboratory as requested by the clinical team. Post procedure chest X-ray reviewed, negative for pneumothorax. IMPRESSION: Successful ultrasound guided right thoracentesis yielding 1.3 L of pleural fluid. Read by: ADurenda Guthrie PA-C Electronically Signed   By: JEldridge AbrahamsD.  On: 05/20/2020 15:53   Medications: Infusions: . sodium chloride Stopped (05/21/20 0236)  . ceFEPime (MAXIPIME) IV Stopped (05/20/20 2234)  . ferric gluconate (FERRLECIT/NULECIT) IV Stopped (05/20/20 1821)  . [START ON 05/22/2020] vancomycin      Scheduled Medications: . sodium chloride   Intravenous Once  . acidophilus  1 capsule Oral Daily  . atorvastatin  80 mg Oral Daily  . Chlorhexidine Gluconate Cloth  6 each Topical Q0600  . cinacalcet  60 mg Oral Q M,W,F  . darbepoetin (ARANESP) injection - DIALYSIS  200 mcg Intravenous Q Wed-HD  . doxercalciferol  2 mcg Intravenous Q M,W,F-HD  . famotidine  20 mg Oral Q M,W,F  . feeding supplement (NEPRO CARB STEADY)  237 mL Oral BID BM  . mometasone-formoterol  2 puff Inhalation BID  . multivitamin  1  tablet Oral QHS  . ticagrelor  90 mg Oral BID  . umeclidinium bromide  1 puff Inhalation Daily    have reviewed scheduled and prn medications.  Physical Exam: General: alert, pleasant, NAD-  occ cough Heart: RRR Lungs: CBS bilat- dec BS at bases- better  Abdomen: soft, non tender Extremities: really no peripheral edema Dialysis Access: right AVF-  patent    05/21/2020,7:52 AM  LOS: 2 days

## 2020-05-21 NOTE — Progress Notes (Signed)
PROGRESS NOTE    Marbin Crawn  [id number] DOB: [birth year] DOA: 05/19/2020 PCP: Patient, No Pcp Per   No chief complaint on file.   Brief Narrative: 65 year old male with history of ESRD on HD, CAD with stent history of zoster ophthalmicus, recent hospital admission for pneumonia and symptomatic anemia and hypoxia presented to the ED for evaluation of chest pain shortness of breath and persistent cough. Patient reports that he was admitted to [facility] roughly 2 weeks PTA- treated for pneumonia, transfused 3 units of RBC, and went home with supplemental oxygen. He continued to feel poorly, has had ongoing productive cough with sputum and small blood clots since prior to the hospitalization, and then developed chest pain and worsening shortness of breath and general malaise and fatigue over the past 2 days. , has not noted any melena or hematochezia.   [facility]Upon arrival to the ED, patient is found to be febrile 102.75F, saturating 87% on 2 L/min of supplemental oxygen, tachypneic in the mid 20s, and with blood pressure 93/40. EKG features sinus tachycardia with nonspecific IVCD and borderline ST depressions in the lateral leads. Chest x-ray was read as bilateral upper lobe infiltrates with right greater than left effusions. Chemistry panel features a potassium of 3.1, bicarbonate 25, and creatinine 3.20. CBC notable for leukocytosis to 11,000 and a normocytic anemia with hemoglobin 6.2. Lactic acid is 2.2 and procalcitonin 1.49. Hs-CRP elevated 49.88 and high-sensitivity troponin 113. INR was 1.3. Covid and influenza PCR was negative. Blood cultures were collected in the emergency department and the patient was treated with 500 cc LR, aspirin, Rocephin, acetaminophen, and vancomycin. He was transferred to [facility] for admission- then to [facility] due to ESRD status  As per chart, in media section: Patient was admitted from 1/16- 1/22 and 1/26  through 2/11: He was found to have an EF of 20 to 25% down from 45 to 50%, anemia and pneumonia.  Patient was seen by cardiology and it was decided that due to his high risk of bleeding noninvasive management was to be done.  Patient was also seen by ID for Citrobacter koseri that grew on his culture after he underwent bronchoscopy.  He was treated with ceftriaxone and oral cefdinir. He also went home on O2. -P/C- ANCA negative -Quantiferon negative ? H/o RA in H&P from [facility]  Subjective: Seen and examined this morning Not feeling well on and of since January Feels good now, on 3l [place] Not in distress C/O blood in sputum- going on for December 2021 Spoke w/ wife on the phone No chest pain. Got some sleep last night for the first time Wife over the phone stated they requested transfer to [facility] for second opinions due to his persistent hemoptysis.  Assessment & Plan:  Sepsis due to pneumonia POA Recently treated pneumonia at [facility] Persistent hemoptysis Procalcitonin has been elevated 23-38, no leukocytosis, at this time afebrile.  Remains on vancomycin/cefepime. Clinically feeling better.  Sputum culture moderate normal respiratory flora no staph or Pseudomonas.  Blood culture no growth so far.  Recently had bronchoscopy at [facility].  ANCA negative, QuantiFERON negative had Citrobacter koseri in bronch culture.  Patient was transferred for second opinion we will request pulmonary consult -patient and wife requesting too.  Patient was on aspirin/Brilinta and prior hemoptysis was thought to be due to this. Recent Labs  Lab 05/19/20 0510 05/19/20 0517 05/19/20 2201 05/20/20 0312 05/21/20 0120  WBC 9.6  --  7.7 8.6 8.1  LATICACIDVEN 1.4  --   --   --   --   PROCALCITON 23.01 23.33  --  38.64  --    Acute on chronic hypoxic respiratory failure/shortness of breath: Due to pneumonia/pleural effusion/anemia, now on 3 L nasal cannula now.  VQ scan not suspicious for PE.  At this dialysis  for pleural effusion.  Monitor H&H.  Continue treatment of pneumonia.  Moderate pleural effusion on the right status post thoracentesis 1.3 L removed 3/2, LDH low 106 Albumin 1.1, protein <3, appears transudative likely from fluid issue with ESRD on dialysis  Symptomatic anemia: 5.5 on admission improved 8.2.  Status post 2 units PRBC on 3/1.  Unclear etiology could be anemia of chronic disease, patient does have mild hemoptysis but persistent.  Hem I nstool was negative.  Monitor and transfuse if needed.  Recent Labs  Lab 05/19/20 0510 05/19/20 2201 05/20/20 0312 05/21/20 0120  HGB 5.5* 7.8* 7.6* 8.2*  HCT 18.5* 24.3* 23.2* 26.4*   ESRD on HD normally on MWF: Nephrology following last HD on Wednesday.  Nephro to ultrafiltrate as able to help with pleural effusion  Metabolic bone disease continue home dose of Hectorol Sensipar but holding PhosLo.  Hypertension holding antihypertensive to give room for ultrafiltration  Chest pain with mildly elevated troponin, in the setting of ESRD CAD , patient reports he had stenting December 2021 Was seen by cardiology during prior admission at [facility] and deemed too high of a risk to undergo any intervention and was being treated medically.  He is on Brilinta, Lipitor.  COPD not wheezing, ICS/LABA as needed albuterol  Hyokalemia defer to nephrology.  Severe protein calorie malnutrition see below  Diet Order            Diet renal with fluid restriction Fluid restriction: 1200 mL Fluid; Room service appropriate? Yes; Fluid consistency: Thin  Diet effective now                 Nutrition Problem: Severe Malnutrition Etiology: chronic illness (ESRD on HD, COPD) Signs/Symptoms: severe fat depletion,severe muscle depletion,moderate fat depletion,moderate muscle depletion Interventions: Nepro shake,Other (Comment) (Rena-Vite) Patient's Body mass index is 18.74 kg/m.  DVT prophylaxis: SCDs Start: 05/19/20 [id number] Code Status:   Code Status:  Full Code  Family Communication: plan of care discussed with patient at bedside. spoke with his wife on the phone  Status is: Inpatient Remains inpatient appropriate because:Ongoing diagnostic testing needed not appropriate for outpatient work up, IV treatments appropriate due to intensity of illness or inability to take PO and Inpatient level of care appropriate due to severity of illness  Dispo: The patient is from: Home              Anticipated d/c is to: Home              Patient currently is not medically stable to d/c.   Difficult to place patient No    Unresulted Labs (From admission, onward)          Start     Ordered   05/20/20 1507  Fungus Culture With Stain  RELEASE UPON ORDERING,   TIMED        05/20/20 1507   05/20/20 1507  Acid Fast Culture with reflexed sensitivities  RELEASE UPON ORDERING,   TIMED        05/20/20 1507   05/20/20 1507  Acid Fast Smear (AFB)  RELEASE UPON ORDERING,   TIMED        05/20/20  1507   05/20/20 1507  Pathologist smear review  Once,   R        05/20/20 1507   05/20/20 0924  Hepatitis B surface antibody  Once,   R       Question:  Specimen collection method  Answer:  Lab=Lab collect   05/20/20 0924   05/20/20 0500  CBC with Differential/Platelet  Daily,   R      05/19/20 1732   05/20/20 0500  Magnesium  Daily,   R      05/19/20 1732   05/20/20 0500  Phosphorus  Daily,   R      05/19/20 1732   05/19/20 0403  Legionella Pneumophila Serogp 1 Ur Ag  Once,   R        05/19/20 0402   Signed and Held  Renal function panel  Once,   R        Signed and Held   Signed and Held  CBC  Once,   R        Signed and Held         Medications reviewed:  Scheduled Meds: . sodium chloride   Intravenous Once  . acidophilus  1 capsule Oral Daily  . atorvastatin  80 mg Oral Daily  . Chlorhexidine Gluconate Cloth  6 each Topical Q0600  . cinacalcet  60 mg Oral Q M,W,F  . darbepoetin (ARANESP) injection - DIALYSIS  200 mcg Intravenous Q Wed-HD  .  doxercalciferol  2 mcg Intravenous Q M,W,F-HD  . famotidine  20 mg Oral Q M,W,F  . feeding supplement (NEPRO CARB STEADY)  237 mL Oral BID BM  . mometasone-formoterol  2 puff Inhalation BID  . multivitamin  1 tablet Oral QHS  . ticagrelor  90 mg Oral BID  . umeclidinium bromide  1 puff Inhalation Daily   Continuous Infusions: . sodium chloride Stopped (05/21/20 0236)  . ceFEPime (MAXIPIME) IV Stopped (05/20/20 2234)  . ferric gluconate (FERRLECIT/NULECIT) IV Stopped (05/20/20 1821)  . [START ON 05/22/2020] vancomycin     Consultants:see note  Procedures:see note  Antimicrobials: Anti-infectives (From admission, onward)   Start     Dose/Rate Route Frequency Ordered Stop   05/22/20 1200  vancomycin (VANCOCIN) IVPB 750 mg/150 ml premix        750 mg 150 mL/hr over 60 Minutes Intravenous Every M-W-F (Hemodialysis) 05/20/20 1019     05/20/20 2200  ceFEPIme (MAXIPIME) 1 g in sodium chloride 0.9 % 100 mL IVPB        1 g 200 mL/hr over 30 Minutes Intravenous Every 24 hours 05/20/20 1015     05/20/20 1200  vancomycin (VANCOCIN) IVPB 500 mg/100 ml premix        500 mg 100 mL/hr over 60 Minutes Intravenous Every Wed (Hemodialysis) 05/20/20 1015 05/20/20 1150   05/20/20 1016  vancomycin (VANCOCIN) 500-5 MG/100ML-% IVPB       Note to Pharmacy: Judieth Keens  : cabinet override      05/20/20 1016 05/20/20 1047   05/19/20 0430  vancomycin (VANCOREADY) IVPB 1000 mg/200 mL  Status:  Discontinued        1,000 mg 200 mL/hr over 60 Minutes Intravenous  Once 05/19/20 0337 05/19/20 0338   05/19/20 0430  ceFEPIme (MAXIPIME) 2 g in sodium chloride 0.9 % 100 mL IVPB        2 g 200 mL/hr over 30 Minutes Intravenous  Once 05/19/20 0337 05/19/20 0610   05/19/20 0430  vancomycin (VANCOREADY) IVPB  1250 mg/250 mL        1,250 mg 166.7 mL/hr over 90 Minutes Intravenous  Once 05/19/20 [id number] 05/19/20 0802     Culture/Microbiology    Component Value Date/Time   SDES  05/19/2020 1344    URINE, CLEAN  CATCH Performed at [facility], [place] [address] [place], [place] [address]    SPECREQUEST  05/19/2020 1344    NONE Performed at [facility], [place] [address], [place], [place] [address]    CULT (A) 05/19/2020 1344    <10,000 COLONIES/mL INSIGNIFICANT GROWTH Performed at [facility] [address], [place], [place] [address]    REPTSTATUS 05/21/2020 FINAL 05/19/2020 1344    Other culture-see note  Objective: Vitals: Today's Vitals   05/21/20 0430 05/21/20 0457 05/21/20 0723 05/21/20 0850  BP:  (!) 149/52 (!) 132/42   Pulse:  84 84   Resp:  17 15   Temp:  99.2 F (37.3 C) 98.5 F (36.9 C)   TempSrc:  Oral Oral   SpO2:  94% 93% 99%  Weight:  57.6 kg    Height:      PainSc: Asleep       Intake/Output Summary (Last 24 hours) at 05/21/2020 0951 Last data filed at 05/21/2020 0800 Gross per 24 hour  Intake 870.14 ml  Output 3800 ml  Net -2929.86 ml   Filed Weights   05/20/20 0840 05/20/20 1205 05/21/20 0457  Weight: 62.8 kg 60 kg 57.6 kg   Weight change: 2.199 kg  Intake/Output from previous day: 03/02 0701 - 03/03 0700 In: 630.1 [P.O.:240; I.V.:50.1; IV Piggyback:340] Out: 3800  Intake/Output this shift: Total I/O In: 240 [P.O.:240] Out: -  Filed Weights   05/20/20 0840 05/20/20 1205 05/21/20 0457  Weight: 62.8 kg 60 kg 57.6 kg    Examination: General exam: AAOx3, thin frial,NAD, on [place], weak appearing. HEENT:Oral mucosa moist, Ear/Nose WNL grossly,dentition normal. Respiratory system: bilaterally diminished,no use of accessory muscle, non tender. Cardiovascular system: S1 & S2 +, regular, No JVD. Gastrointestinal system: Abdomen soft, NT,ND, BS+. Nervous System:Alert, awake, moving extremities and grossly nonfocal Extremities: b/l left foot amputation,No edema, distal peripheral pulses palpable.  Skin: No rashes,no icterus. MSK: Normal muscle bulk,tone, power RUE graft for HD   Data Reviewed: I have personally  reviewed following labs and imaging studies CBC: Recent Labs  Lab 05/19/20 0510 05/19/20 2201 05/20/20 0312 05/21/20 0120  WBC 9.6 7.7 8.6 8.1  NEUTROABS  --  6.3 7.2 6.6  HGB 5.5* 7.8* 7.6* 8.2*  HCT 18.5* 24.3* 23.2* 26.4*  MCV 94.9 91.7 90.6 92.6  PLT 196 212 219 [id number]   Basic Metabolic Panel: Recent Labs  Lab 05/19/20 0510 05/19/20 0517 05/20/20 0312 05/21/20 0120  NA 138 138 135 136  K 3.1* 3.1* 3.3* 3.2*  CL 95* 95* 95* 99  CO2 '26 31 24 24  '$ GLUCOSE 111* 113* 170* 192*  BUN 31* 33* 51* 28*  CREATININE 4.06* 4.15* 5.75* 4.37*  CALCIUM 7.9* 8.1* 7.5* 8.0*  MG 1.9  --  2.0 1.9  PHOS  --   --  4.2 2.7   GFR: Estimated Creatinine Clearance: 13.7 mL/min (A) (by C-G formula based on SCr of 4.37 mg/dL (H)). Liver Function Tests: Recent Labs  Lab 05/19/20 0510  AST 47*  ALT 29  ALKPHOS 121  BILITOT 0.9  PROT 6.8  ALBUMIN 2.7*   No results for input(s): LIPASE, AMYLASE in the last 168 hours. No results for input(s): AMMONIA  in the last 168 hours. Coagulation Profile: No results for input(s): INR, PROTIME in the last 168 hours. Cardiac Enzymes: No results for input(s): CKTOTAL, CKMB, CKMBINDEX, TROPONINI in the last 168 hours. BNP (last 3 results) No results for input(s): PROBNP in the last 8760 hours. HbA1C: No results for input(s): HGBA1C in the last 72 hours. CBG: No results for input(s): GLUCAP in the last 168 hours. Lipid Profile: No results for input(s): CHOL, HDL, LDLCALC, TRIG, CHOLHDL, LDLDIRECT in the last 72 hours. Thyroid Function Tests: No results for input(s): TSH, T4TOTAL, FREET4, T3FREE, THYROIDAB in the last 72 hours. Anemia Panel: Recent Labs    05/19/20 0517  VITAMINB12 1,242*  FOLATE 20.0  FERRITIN >7,500*  TIBC 166*  IRON 34*  RETICCTPCT 5.3*   Sepsis Labs: Recent Labs  Lab 05/19/20 0510 05/19/20 0517 05/20/20 0312  PROCALCITON 23.01 23.33 38.64  LATICACIDVEN 1.4  --   --     Recent Results (from the past 240 hour(s))   Culture, blood (x 2)     Status: None (Preliminary result)   Collection Time: 05/19/20  5:10 AM   Specimen: BLOOD LEFT HAND  Result Value Ref Range Status   Specimen Description   Final    BLOOD LEFT HAND Performed at [facility] [address], [place], [place] [address]    Special Requests   Final    BOTTLES DRAWN AEROBIC ONLY Blood Culture adequate volume Performed at [facility] [address] [place], [place] [address]    Culture   Final    NO GROWTH 1 DAY Performed at [facility], [place] [address] [place], [place] [address]    Report Status PENDING  Incomplete  Culture, blood (x 2)     Status: None (Preliminary result)   Collection Time: 05/19/20  5:23 AM   Specimen: BLOOD  Result Value Ref Range Status   Specimen Description   Final    BLOOD LEFT ANTECUBITAL Performed at [facility] [address] [place], [place] [address]    Special Requests   Final    BOTTLES DRAWN AEROBIC AND ANAEROBIC Blood Culture adequate volume Performed at [facility] [address], [place], [place] [address]    Culture   Final    NO GROWTH 1 DAY Performed at [facility], [place] [address], [place], [place] [address]    Report Status PENDING  Incomplete  Culture, sputum-assessment     Status: None   Collection Time: 05/19/20 10:07 AM   Specimen: Sputum  Result Value Ref Range Status   Specimen Description SPUTUM  Final   Special Requests NONE  Final   Sputum evaluation   Final    THIS SPECIMEN IS ACCEPTABLE FOR SPUTUM CULTURE Performed at [facility], [place] [address], [place], [place] [address]    Report Status 05/19/2020 FINAL  Final  Culture, Respiratory w Gram Stain     Status: None (Preliminary result)   Collection Time: 05/19/20 10:07 AM   Specimen: SPU  Result Value Ref Range Status   Specimen Description   Final    SPUTUM Performed at [facility] [address], [place], [place] [address]    Special Requests   Final    NONE Reflexed from [id number] Performed at [facility] [address] [place], [place] [address]    Gram Stain   Final    RARE WBC PRESENT, PREDOMINANTLY MONONUCLEAR FEW  SQUAMOUS EPITHELIAL CELLS PRESENT NO ORGANISMS SEEN    Culture   Final    CULTURE REINCUBATED FOR BETTER GROWTH Performed at [facility], [place] [address], [place], [place] [address]    Report Status PENDING  Incomplete  SARS CORONAVIRUS 2 (TAT 6-24 HRS) Nasopharyngeal Nasopharyngeal Swab     Status: None   Collection Time: 05/19/20 11:24 AM   Specimen: Nasopharyngeal Swab  Result Value Ref Range Status   SARS Coronavirus 2 NEGATIVE NEGATIVE Final    Comment: (NOTE) SARS-CoV-2 target nucleic acids are NOT DETECTED.  The SARS-CoV-2 RNA is generally detectable in upper and lower respiratory specimens during the acute phase of infection. Negative results do not preclude SARS-CoV-2 infection, do not rule out co-infections with other pathogens, and should not be used as the sole basis for treatment or other patient management decisions. Negative results must be combined with clinical observations, patient history, and epidemiological information. The expected result is Negative.  Fact Sheet for Patients: SugarRoll.be  Fact Sheet for Healthcare Providers: https://www.woods-mathews.com/  This test is not yet approved or cleared by the [place] FDA and  has been authorized for detection and/or diagnosis of SARS-CoV-2 by FDA under an Emergency Use Authorization (EUA). This EUA will remain  in effect (meaning this test can be used) for the duration of the COVID-19 declaration under Se ction 564(b)(1) of the Act, 21 U.S.C. section 360bbb-3(b)(1), unless the authorization is terminated or revoked sooner.  Performed at [facility], [place] [address]  [place], [place] [address]   Urine culture     Status: Abnormal   Collection Time: 05/19/20  1:44 PM   Specimen: Urine, Clean Catch  Result Value Ref Range Status   Specimen Description   Final    URINE, CLEAN CATCH Performed at [facility], [place] [address], [place], [place] [address]    Special Requests   Final    NONE Performed at [facility], [place] [address] [place], [place] [address]    Culture (A)  Final    <10,000 COLONIES/mL INSIGNIFICANT GROWTH Performed at [facility] [address], [place], [place] [address]    Report Status 05/21/2020 FINAL  Final  MRSA PCR Screening     Status: None   Collection Time: 05/19/20  5:59 PM   Specimen: Nasal Mucosa; Nasopharyngeal  Result Value Ref Range Status   MRSA by PCR NEGATIVE NEGATIVE Final    Comment:        The GeneXpert MRSA Assay (FDA approved for NASAL specimens only), is one component of a comprehensive MRSA colonization surveillance program. It is not intended to diagnose MRSA infection nor to guide or monitor treatment for MRSA infections. Performed at [facility], [place] [address], [place], [place] [address]      Radiology Studies: DG Chest 1 View  Result Date: 05/20/2020 CLINICAL DATA:  Post right thoracentesis. EXAM: CHEST  1 VIEW COMPARISON:  05/19/2020. FINDINGS: Heart size stable. Diffuse bilateral pulmonary infiltrates/edema with slight interim improvement from prior exam. Improvement right pleural effusion. Stable small left pleural effusion. No pneumothorax post thoracentesis. Bilateral upper extremity stents noted. Peripheral vascular calcification. IMPRESSION: 1. No pneumothorax post thoracentesis. 2. Diffuse bilateral pulmonary infiltrates/edema with slight interim improvement from prior exam. Improvement and right pleural effusion. Stable small left pleural effusion. Electronically Signed   By: Marcello Moores  Register   On: 05/20/2020 15:42   NM Pulmonary  Perfusion  Result Date: 05/19/2020 CLINICAL DATA:  Chest pain, shortness of breath and elevated D-dimer. The patient has pneumonia. EXAM: NUCLEAR MEDICINE PERFUSION LUNG SCAN TECHNIQUE: Perfusion images were obtained in multiple projections after intravenous injection of radiopharmaceutical. Ventilation scans intentionally deferred if perfusion scan and chest x-ray adequate for interpretation during COVID 19 epidemic. RADIOPHARMACEUTICALS:  4.4 mCi Tc-55mMAA IV COMPARISON:  Single-view of the chest earlier today. FINDINGS: Small nonsegmental defects are seen in the upper lobes bilaterally. Defect on the right corresponds with finding on chest film. No definite correlation with defect on the left is seen. Perfusion is otherwise normal. IMPRESSION: Small nonsegmental defects in the upper lobes bilaterally are likely related to the patient's pneumonia rather than pulmonary embolus. Electronically Signed   By: TInge RiseM.D.   On: 05/19/2020 14:43   DG CHEST PORT 1 VIEW  Result Date: 05/19/2020 CLINICAL DATA:  Pneumonia. EXAM: PORTABLE CHEST 1 VIEW COMPARISON:  One-view chest x-ray 05/19/2020 FINDINGS: Heart is enlarged. Atherosclerotic calcifications are present. Diffuse interstitial pattern is again seen. Moderate right pleural effusion present. No pneumothorax. Bibasilar airspace disease is worse right than left. IMPRESSION: 1. Cardiomegaly with diffuse interstitial pattern compatible with edema. Infection is not excluded. Patchy opacities seen on the prior study are less prominent. Pattern is more diffuse. 2. Moderate right pleural effusion. 3. Bibasilar airspace disease is worse on the right than left. Electronically Signed   By: CSan MorelleM.D.   On: 05/19/2020 10:47   IR THORACENTESIS ASP PLEURAL SPACE W/IMG GUIDE  Result Date: 05/20/2020 INDICATION: Shortness of breath. Right pleural effusions. Request made for therapeutic and diagnostic thoracentesis. EXAM: ULTRASOUND GUIDED RIGHT  THORACENTESIS MEDICATIONS: 10 mL 1% lidocaine COMPLICATIONS: None immediate. PROCEDURE: An ultrasound guided thoracentesis was thoroughly discussed with the patient and questions answered. The benefits, risks, alternatives and complications were also discussed. The patient understands and wishes to proceed with the procedure. Written consent was obtained. Ultrasound was performed to localize and mark an adequate pocket of fluid in the right chest. The area was then prepped and draped in the normal sterile fashion. 1% Lidocaine was used for local anesthesia. Under ultrasound guidance a 6 Fr Safe-T-Centesis catheter was introduced. Thoracentesis was performed. The catheter was removed and a dressing applied. FINDINGS: A total of approximately 1.3 L of clear yellow fluid was removed. Samples were sent to the laboratory as requested by the clinical team. Post procedure chest X-ray reviewed, negative for pneumothorax. IMPRESSION: Successful ultrasound guided right thoracentesis yielding 1.3 L of pleural fluid. Read by: ADurenda Guthrie PA-C Electronically Signed   By: JSandi MariscalM.D.   On: 05/20/2020 15:53     LOS: 2 days   RAntonieta Pert MD [facility]  05/21/2020, 9:51 AM

## 2020-05-22 ENCOUNTER — Encounter (HOSPITAL_COMMUNITY): Admission: AD | Disposition: A | Discharge status: Home / Self Care | Payer: Self-pay | Attending: Family Medicine

## 2020-05-22 ENCOUNTER — Inpatient Hospital Stay (HOSPITAL_COMMUNITY): Payer: Medicare Other

## 2020-05-22 ENCOUNTER — Inpatient Hospital Stay (HOSPITAL_COMMUNITY): Payer: Medicare Other | Admitting: Anesthesiology

## 2020-05-22 ENCOUNTER — Encounter (HOSPITAL_COMMUNITY): Payer: Self-pay | Admitting: Family Medicine

## 2020-05-22 DIAGNOSIS — J189 Pneumonia, unspecified organism: Secondary | ICD-10-CM | POA: Diagnosis not present

## 2020-05-22 DIAGNOSIS — A419 Sepsis, unspecified organism: Secondary | ICD-10-CM | POA: Diagnosis not present

## 2020-05-22 HISTORY — PX: VIDEO BRONCHOSCOPY: SHX5072

## 2020-05-22 HISTORY — PX: BRONCHIAL WASHINGS: SHX5105

## 2020-05-22 LAB — BASIC METABOLIC PANEL
Anion gap: 14 (ref 5–15)
BUN: 43 mg/dL — ABNORMAL HIGH (ref 8–23)
CO2: 24 mmol/L (ref 22–32)
Calcium: 8.2 mg/dL — ABNORMAL LOW (ref 8.9–10.3)
Chloride: 93 mmol/L — ABNORMAL LOW (ref 98–111)
Creatinine, Ser: 5.7 mg/dL — ABNORMAL HIGH (ref 0.61–1.24)
GFR, Estimated: 10 mL/min — ABNORMAL LOW (ref >60)
Glucose, Bld: 273 mg/dL — ABNORMAL HIGH (ref 70–99)
Potassium: 3.3 mmol/L — ABNORMAL LOW (ref 3.5–5.1)
Sodium: 131 mmol/L — ABNORMAL LOW (ref 135–145)

## 2020-05-22 LAB — BODY FLUID CELL COUNT WITH DIFFERENTIAL
Eos, Fluid: 0 %
Lymphs, Fluid: 8 %
Monocyte-Macrophage-Serous Fluid: 87 % (ref 50–90)
Neutrophil Count, Fluid: 5 % (ref 0–25)
Total Nucleated Cell Count, Fluid: 270 cu mm (ref 0–1000)

## 2020-05-22 LAB — RENAL FUNCTION PANEL
Albumin: 2.2 g/dL — ABNORMAL LOW (ref 3.5–5.0)
Anion gap: 15 (ref 5–15)
BUN: 51 mg/dL — ABNORMAL HIGH (ref 8–23)
CO2: 21 mmol/L — ABNORMAL LOW (ref 22–32)
Calcium: 8 mg/dL — ABNORMAL LOW (ref 8.9–10.3)
Chloride: 96 mmol/L — ABNORMAL LOW (ref 98–111)
Creatinine, Ser: 6.75 mg/dL — ABNORMAL HIGH (ref 0.61–1.24)
GFR, Estimated: 8 mL/min — ABNORMAL LOW (ref >60)
Glucose, Bld: 325 mg/dL — ABNORMAL HIGH (ref 70–99)
Phosphorus: 2.8 mg/dL (ref 2.5–4.6)
Potassium: 3.6 mmol/L (ref 3.5–5.1)
Sodium: 132 mmol/L — ABNORMAL LOW (ref 135–145)

## 2020-05-22 LAB — CBC
HCT: 24.2 % — ABNORMAL LOW (ref 39.0–52.0)
Hemoglobin: 8 g/dL — ABNORMAL LOW (ref 13.0–17.0)
MCH: 29.7 pg (ref 26.0–34.0)
MCHC: 33.1 g/dL (ref 30.0–36.0)
MCV: 90 fL (ref 80.0–100.0)
Platelets: 204 10*3/uL (ref 150–400)
RBC: 2.69 MIL/uL — ABNORMAL LOW (ref 4.22–5.81)
RDW: 17.8 % — ABNORMAL HIGH (ref 11.5–15.5)
WBC: 7.8 10*3/uL (ref 4.0–10.5)
nRBC: 1.2 % — ABNORMAL HIGH (ref 0.0–0.2)

## 2020-05-22 LAB — PHOSPHORUS: Phosphorus: 2 mg/dL — ABNORMAL LOW (ref 2.5–4.6)

## 2020-05-22 LAB — MAGNESIUM: Magnesium: 2 mg/dL (ref 1.7–2.4)

## 2020-05-22 LAB — HEPATITIS B SURFACE ANTIBODY, QUANTITATIVE: Hep B S AB Quant (Post): 241.9 m[IU]/mL (ref >9.9)

## 2020-05-22 SURGERY — VIDEO BRONCHOSCOPY WITHOUT FLUORO
Anesthesia: Monitor Anesthesia Care | Laterality: Right

## 2020-05-22 MED ORDER — DOXERCALCIFEROL 4 MCG/2ML IV SOLN
INTRAVENOUS | Status: AC
  Administered 2020-05-22: 2 ug
  Filled 2020-05-22: qty 2

## 2020-05-22 MED ORDER — LIDOCAINE HCL URETHRAL/MUCOSAL 2 % EX GEL
CUTANEOUS | Status: AC
  Filled 2020-05-22: qty 11

## 2020-05-22 MED ORDER — LIDOCAINE HCL (PF) 1 % IJ SOLN
INTRAMUSCULAR | Status: AC
  Filled 2020-05-22: qty 30

## 2020-05-22 MED ORDER — SODIUM CHLORIDE 0.9 % IV SOLN: INTRAVENOUS | Status: DC | PRN

## 2020-05-22 MED ORDER — MIDAZOLAM HCL 2 MG/2ML IJ SOLN
INTRAMUSCULAR | Status: DC | PRN
  Administered 2020-05-22: 1 mg via INTRAVENOUS

## 2020-05-22 MED ORDER — PROPOFOL 500 MG/50ML IV EMUL
INTRAVENOUS | Status: DC | PRN
  Administered 2020-05-22: 75 ug/kg/min via INTRAVENOUS

## 2020-05-22 MED ORDER — BUTAMBEN-TETRACAINE-BENZOCAINE 2-2-14 % EX AERO
1.0000 | INHALATION_SPRAY | Freq: Once | CUTANEOUS | Status: DC
  Filled 2020-05-22: qty 20

## 2020-05-22 MED ORDER — LIDOCAINE HCL URETHRAL/MUCOSAL 2 % EX GEL
CUTANEOUS | Status: DC | PRN
  Administered 2020-05-22: 1

## 2020-05-22 MED ORDER — LIDOCAINE HCL 1 % IJ SOLN
INTRAMUSCULAR | Status: DC | PRN
  Administered 2020-05-22 (×2): 4 mL

## 2020-05-22 MED ORDER — LIDOCAINE HCL URETHRAL/MUCOSAL 2 % EX GEL
1.0000 "application " | Freq: Once | CUTANEOUS | Status: DC
  Filled 2020-05-22: qty 11

## 2020-05-22 MED ORDER — LIDOCAINE 2% (20 MG/ML) 5 ML SYRINGE
INTRAMUSCULAR | Status: DC | PRN
  Administered 2020-05-22: 60 mg via INTRAVENOUS
  Administered 2020-05-22: 40 mg via INTRAVENOUS

## 2020-05-22 MED ORDER — PHENYLEPHRINE HCL 0.25 % NA SOLN
1.0000 | Freq: Four times a day (QID) | NASAL | Status: DC | PRN
  Filled 2020-05-22: qty 15

## 2020-05-22 MED ORDER — PROPOFOL 10 MG/ML IV BOLUS
INTRAVENOUS | Status: DC | PRN
  Administered 2020-05-22 (×2): 20 mg via INTRAVENOUS

## 2020-05-22 NOTE — Plan of Care (Signed)

## 2020-05-22 NOTE — Anesthesia Preprocedure Evaluation (Signed)
Anesthesia Evaluation  Patient identified by MRN, date of birth, ID band Patient awake    Reviewed: Allergy & Precautions, H&P , NPO status , Patient's Chart, lab work & pertinent test results  Airway Mallampati: II   Neck ROM: full    Dental   Pulmonary COPD,  Pleural effusion recently drained   breath sounds clear to auscultation       Cardiovascular + CAD and + Cardiac Stents   Rhythm:regular Rate:Normal     Neuro/Psych  Neuromuscular disease    GI/Hepatic   Endo/Other    Renal/GU ESRF and DialysisRenal disease     Musculoskeletal   Abdominal   Peds  Hematology  (+) Blood dyscrasia, anemia ,   Anesthesia Other Findings   Reproductive/Obstetrics                             Anesthesia Physical Anesthesia Plan  ASA: IV  Anesthesia Plan: General   Post-op Pain Management:    Induction: Intravenous  PONV Risk Score and Plan: 2 and Ondansetron, Dexamethasone and Treatment may vary due to age or medical condition  Airway Management Planned: Oral ETT  Additional Equipment:   Intra-op Plan:   Post-operative Plan: Extubation in OR  Informed Consent: I have reviewed the patients History and Physical, chart, labs and discussed the procedure including the risks, benefits and alternatives for the proposed anesthesia with the patient or authorized representative who has indicated his/her understanding and acceptance.     Dental advisory given  Plan Discussed with: CRNA, Anesthesiologist and Surgeon  Anesthesia Plan Comments:         Anesthesia Quick Evaluation

## 2020-05-22 NOTE — Anesthesia Procedure Notes (Signed)
Procedure Name: MAC Date/Time: 05/22/2020 9:12 AM Performed by: Trinna Post., CRNA Pre-anesthesia Checklist: Patient identified, Emergency Drugs available, Suction available, Patient being monitored and Timeout performed Patient Re-evaluated:Patient Re-evaluated prior to induction Oxygen Delivery Method: Nasal cannula Preoxygenation: Pre-oxygenation with 100% oxygen Induction Type: IV induction Placement Confirmation: positive ETCO2

## 2020-05-22 NOTE — Progress Notes (Signed)
Subjective:  No new c/os, had some acute SOB overnight, now better -  Planning for HD and bronchoscopy today per pulmonary     Objective Vital signs in last 24 hours: Vitals:   05/22/20 0327 05/22/20 0429 05/22/20 0749 05/22/20 0754  BP: 123/65  (!) 90/50 (!) 144/50  Pulse: 84  80 83  Resp: '20  16 16  '$ Temp: 98.4 F (36.9 C)  98.6 F (37 C)   TempSrc: Oral  Oral   SpO2: 96%  95% 96%  Weight:  59.9 kg    Height:       Weight change: -2.9 kg  Intake/Output Summary (Last 24 hours) at 05/22/2020 0800 Last data filed at 05/22/2020 0327 Gross per 24 hour  Intake 285.42 ml  Output 125 ml  Net 160.42 ml   Dialyzes at [employer]-  mwf  - 3 hours and 30 minutes EDW 61.5. HD Bath unclear, Dialyzer 180, Heparin no. Access left AVG. sensipar 66 q HD, hectorol 2 q tx Last hgb 6.9 on 2/21  Assessment/Plan: 65 year old BM with ESRD and COPD-  Recent hosp for PNA remains SOB-  Very anemic and with pleural effusion 1 SOB-  Likely multifactorial.  Has history of COPD.  Just got over pneumonia so could have some residual from that or some deconditioning.  Now the added impact of being very anemic and also having a pleural effusion.  s/p thoracentesis.   Previous fever but not outrageous white blood count.  Given empiric antibiotics.  Will UF as able with HD   2 ESRD: Normally Monday Wednesday Friday via AV graft.  Received full treatment on Monday,  Wednesday.  We will try to ultrafilter as able to help with pleural effusion. 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  However, anemia seems out of proportion to just ESRD.  Got partial transfusion Tuesday.   will give IV iron and high dose ESA to assist as well.  Most certainly contributing to his feeling of shortness of breath- is improving   5. Metabolic  Bone Disease: We will continue his home doses of Hectorol, Sensipar - hold phoslo for now as he does not seem to be eating much and phos is [employer]    Labs: Basic Metabolic Panel: Recent Labs  Lab 05/20/20 0312 05/21/20 0120 05/21/20 2226  NA 135 136 131*  K 3.3* 3.2* 3.3*  CL 95* 99 93*  CO2 '24 24 24  '$ GLUCOSE 170* 192* 273*  BUN 51* 28* 43*  CREATININE 5.75* 4.37* 5.70*  CALCIUM 7.5* 8.0* 8.2*  PHOS 4.2 2.7 2.0*   Liver Function Tests: Recent Labs  Lab 05/19/20 0510  AST 47*  ALT 29  ALKPHOS 121  BILITOT 0.9  PROT 6.8  ALBUMIN 2.7*   No results for input(s): LIPASE, AMYLASE in the last 168 hours. No results for input(s): AMMONIA in the last 168 hours. CBC: Recent Labs  Lab 05/19/20 0510 05/19/20 0510 05/19/20 2201 05/20/20 0312 05/21/20 0120 05/21/20 2226  WBC 9.6  --  7.7 8.6 8.1 7.6  NEUTROABS  --    < > 6.3 7.2 6.6 6.1  HGB 5.5*  --  7.8* 7.6* 8.2* 8.6*  HCT 18.5*  --  24.3* 23.2* 26.4* 28.3*  MCV 94.9  --  91.7 90.6 92.6 95.0  PLT 196  --  212 219 198 223   < > = values in this interval not displayed.   Cardiac Enzymes: No results for input(s): CKTOTAL, CKMB, CKMBINDEX, TROPONINI in the last 168 hours. CBG: No results for input(s): GLUCAP in the last 168 hours.  Iron Studies:  No results for input(s): IRON, TIBC, TRANSFERRIN, FERRITIN in the last 72 hours. Studies/Results: DG Chest 1 View  Result Date: 05/20/2020 CLINICAL DATA:  Post right thoracentesis. EXAM: CHEST  1 VIEW COMPARISON:  05/19/2020. FINDINGS: Heart size stable. Diffuse bilateral pulmonary infiltrates/edema with slight interim improvement from prior exam. Improvement right pleural effusion. Stable small left pleural effusion. No pneumothorax post thoracentesis. Bilateral upper extremity stents noted. Peripheral vascular calcification. IMPRESSION: 1. No pneumothorax post thoracentesis. 2. Diffuse bilateral pulmonary infiltrates/edema with slight interim improvement  from prior exam. Improvement and right pleural effusion. Stable small left pleural effusion. Electronically Signed   By: Marcello Moores  Register   On: 05/20/2020 15:42   CT CHEST WO CONTRAST  Result Date: 05/22/2020 CLINICAL DATA:  Follow-up pneumonia EXAM: CT CHEST WITHOUT CONTRAST TECHNIQUE: Multidetector CT imaging of the chest was performed following the standard protocol without IV contrast. COMPARISON:  Plain film from 05/20/2020 FINDINGS: Cardiovascular: Somewhat limited due to lack of IV contrast. Mild decreased attenuation of the cardiac blood pool is noted consistent with underlying anemia. Diffuse coronary and aortic calcifications are noted. No aneurysmal dilatation is seen. Vascular stenting is noted in the right arm. Mediastinum/Nodes: Thoracic inlet is within normal limits. Scattered small hilar and mediastinal lymph nodes are seen likely reactive in nature. Lungs/Pleura: Bilateral pleural effusions are noted right considerably greater than left. Right lower lobe consolidation is noted consistent with acute pneumonia. Additionally changes of central vascular congestion and edema are noted similar to that seen on prior plain film examination. This may be related to a degree of volume overload. Upper Abdomen: Visualized upper abdomen shows no acute abnormality. Musculoskeletal: No acute bony abnormality is noted. Degenerative changes of the thoracic spine are seen. 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 IMPRESSION: Right lower lobe pneumonia. Bilateral pleural effusions and findings suggestive of CHF similar to that noted on prior plain film examination. Aortic Atherosclerosis (ICD10-I70.0). Electronically Signed   By: Inez Catalina M.D.   On: 05/22/2020 05:21   IR THORACENTESIS ASP PLEURAL SPACE W/IMG GUIDE  Result Date: 05/20/2020 INDICATION: Shortness of breath. Right pleural effusions. Request made for therapeutic and diagnostic thoracentesis. EXAM: ULTRASOUND GUIDED RIGHT  THORACENTESIS MEDICATIONS: 10 mL 1% lidocaine COMPLICATIONS: None immediate. PROCEDURE: An ultrasound guided thoracentesis was thoroughly discussed with the patient and questions answered. The benefits, risks, alternatives and complications were also discussed. The patient understands and wishes to proceed with the procedure. Written consent was obtained. Ultrasound was performed to localize and mark an adequate pocket of fluid in the right chest. The area was then prepped and draped in the normal sterile fashion. 1% Lidocaine was used for local anesthesia. Under ultrasound guidance a 6 Fr Safe-T-Centesis catheter was introduced. Thoracentesis was performed. The catheter was removed and a dressing applied. FINDINGS: A total of approximately 1.3 L of clear yellow fluid was removed. Samples were sent to the laboratory as requested by the clinical team. Post procedure chest X-ray reviewed, negative for pneumothorax. IMPRESSION: Successful ultrasound guided right thoracentesis yielding 1.3 L of pleural fluid. Read by: Durenda Guthrie, PA-C Electronically Signed   By: Eldridge Abrahams.D.  On: 05/20/2020 15:53   Medications: Infusions: . sodium chloride 10 mL/hr at 05/22/20 0000  . ceFEPime (MAXIPIME) IV Stopped (05/21/20 2302)  . ferric gluconate (FERRLECIT/NULECIT) IV Stopped (05/21/20 1842)  . vancomycin      Scheduled Medications: . sodium chloride   Intravenous Once  . acidophilus  1 capsule Oral Daily  . atorvastatin  80 mg Oral Daily  . butamben-tetracaine-benzocaine  1 spray Topical Once  . Chlorhexidine Gluconate Cloth  6 each Topical Q0600  . Chlorhexidine Gluconate Cloth  6 each Topical Q0600  . cinacalcet  60 mg Oral Q M,W,F  . darbepoetin (ARANESP) injection - DIALYSIS  200 mcg Intravenous Q Wed-HD  . doxercalciferol  2 mcg Intravenous Q M,W,F-HD  . famotidine  20 mg Oral Q M,W,F  . feeding supplement (NEPRO CARB STEADY)  237 mL Oral BID BM  . lidocaine  1 application Topical Once  .  mometasone-formoterol  2 puff Inhalation BID  . multivitamin  1 tablet Oral QHS  . ticagrelor  90 mg Oral BID  . umeclidinium bromide  1 puff Inhalation Daily    have reviewed scheduled and prn medications.  Physical Exam: General: alert, pleasant, NAD-  occ cough Heart: RRR Lungs: CBS bilat- dec BS at bases- better  Abdomen: soft, non tender Extremities: really no peripheral edema Dialysis Access: right AVF-  patent    05/22/2020,8:00 AM  LOS: 3 days

## 2020-05-22 NOTE — Progress Notes (Signed)
PROGRESS NOTE    Brandon Chaney  GLO:[id number] DOB: December 14, 1955 DOA: 05/19/2020 PCP: Patient, No Pcp Per   No chief complaint on file.   Brief Narrative: 65 year old male with history of ESRD on HD, CAD with stent history of zoster ophthalmicus, recent hospital admission for pneumonia and symptomatic anemia and hypoxia presented to the ED for evaluation of chest pain shortness of breath and persistent cough. Patient reports that he was admitted to [facility] roughly 2 weeks PTA- treated for pneumonia, transfused 3 units of RBC, and went home with supplemental oxygen. He continued to feel poorly, has had ongoing productive cough with sputum and small blood clots since prior to the hospitalization, and then developed chest pain and worsening shortness of breath and general malaise and fatigue over the past 2 days. , has not noted any melena or hematochezia.   [facility]Upon arrival to the ED, patient is found to be febrile 102.38F, saturating 87% on 2 L/min of supplemental oxygen, tachypneic in the mid 20s, and with blood pressure 93/40. EKG features sinus tachycardia with nonspecific IVCD and borderline ST depressions in the lateral leads. Chest x-ray was read as bilateral upper lobe infiltrates with right greater than left effusions. Chemistry panel features a potassium of 3.1, bicarbonate 25, and creatinine 3.20. CBC notable for leukocytosis to 11,000 and a normocytic anemia with hemoglobin 6.2. Lactic acid is 2.2 and procalcitonin 1.49. Hs-CRP elevated 49.88 and high-sensitivity troponin 113. INR was 1.3. Covid and influenza PCR was negative. Blood cultures were collected in the emergency department and the patient was treated with 500 cc LR, aspirin, Rocephin, acetaminophen, and vancomycin. He was transferred to [facility] for admission- then to [facility] due to ESRD status  As per chart, in media section: Patient was admitted from 1/16- 1/22 and 1/26  through 2/11: He was found to have an EF of 20 to 25% down from 45 to 50%, anemia and pneumonia.  Patient was seen by cardiology and it was decided that due to his high risk of bleeding noninvasive management was to be done.  Patient was also seen by ID for Citrobacter koseri that grew on his culture after he underwent bronchoscopy.  He was treated with ceftriaxone and oral cefdinir. He also went home on O2. -P/C- ANCA negative -Quantiferon negative ? 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   He is sleeping, woke up and interacted. abt to go to HD soon C/O blood in sputum- going on for December 2021 Now on 2 L [place] home setting  Assessment & Plan:  Sepsis due to pneumonia POA Recently treated pneumonia at [facility] hospital Persistent hemoptysis Procalcitonin has been elevated 23-38, no leukocytosis, afebrile.Sputum culture moderate normal respiratory flora no staph or Pseudomonas.  Blood culture no growth so far.  Recently had bronchoscopy at [facility].  ANCA negative, QuantiFERON negative had Citrobacter koseri in bronch culture.  Patient was transferred for second opinion at [facility].  Pulmonary consulted appreciate input , CT chest 3/3- RLL pneumonia, bilateral pleural effusions findings history of CHF similar to prior exam and is s/p bronchoscopy 3/4-normal airway, BAL was performed, f/u BAL results. Continue vancomycin and cefepime. Patient was on aspirin/Brilinta and prior hemoptysis was thought to be due to this. Recent Labs  Lab 05/19/20 0510 05/19/20 0517 05/19/20 2201 05/20/20 0312 05/21/20 0120 05/21/20 2226  WBC 9.6  --  7.7  8.6 8.1 7.6  LATICACIDVEN 1.4  --   --   --   --   --   PROCALCITON 23.01 23.33  --  38.64  --   --    Acute on chronic hypoxic respiratory failure/shortness of breath: Due to pneumonia/pleural effusion/anemia.VQ  scan not suspicious for PE.  Feels better after thoracentesis.  Continue to adjust fluid received with dialysis continue antibiotic for pneumonia.    Moderate pleural effusion on the right status post thoracentesis 1.3 L removed 3/2, LDH low 106 Albumin 1.1, protein <3, appears transudative likely from fluid issue with ESRD on dialysis.  Symptomatic anemia: 5.5 on admission improved 8.2.  With 2 units PRBC on 3/1.  Unclear etiology could be anemia of chronic disease, patient does have mild hemoptysis but persistent.  Hemoccult was negative.  Monitor H&H and transfuse as needed monitor and transfuse if needed.  Recent Labs  Lab 05/19/20 0510 05/19/20 2201 05/20/20 0312 05/21/20 0120 05/21/20 2226  HGB 5.5* 7.8* 7.6* 8.2* 8.6*  HCT 18.5* 24.3* 23.2* 26.4* 28.3*   ESRD on HD normally on MWF: Is for dialysis today.Nephro to ultrafiltrate as able to help with pleural effusion  Metabolic bone disease continue home dose of Hectorol Sensipar but holding PhosLo-defer to nephrology.  Hypertension holding antihypertensive to give room for ultrafiltration  Chest pain with mildly elevated troponin, in the setting of ESRD-type II demand ischemia CAD , patient reports he had stenting December 2021 Was seen by cardiology during prior admission at [facility] and deemed too high of a risk to undergo any intervention and was being treated medically.  We will continue with Brilinta, Lipitor.  COPD compensated, continue ICS/LABA as needed albuterol  Hyokalemia monitor and address in dialysis  Severe protein calorie malnutrition see below.  Augment diet  Diet Order            Diet renal with fluid restriction Fluid restriction: 1200 mL Fluid; Room service appropriate? Yes; Fluid consistency: Thin  Diet effective now                 Nutrition Problem: Severe Malnutrition Etiology: chronic illness (ESRD on HD, COPD) Signs/Symptoms: severe fat depletion,severe muscle depletion,moderate fat  depletion,moderate muscle depletion Interventions: Nepro shake,Other (Comment) (Rena-Vite) Patient's Body mass index is 19.5 kg/m.  DVT prophylaxis: SCDs Start: 05/19/20 2956 Code Status:   Code Status: Full Code  Family Communication: plan of care discussed with patient at bedside.  I discussed and updated patient's wife over the phone 3/3  . Status is: Inpatient Remains inpatient appropriate because:Ongoing diagnostic testing needed not appropriate for outpatient work up, IV treatments appropriate due to intensity of illness or inability to take PO and Inpatient level of care appropriate due to severity of illness  Dispo: The patient is from: Home              Anticipated d/c is to: Home              Patient currently is not medically stable to d/c.   Difficult to place patient No    Unresulted Labs (From admission, onward)          Start     Ordered   05/22/20 1333  Renal function panel  Once,   R       Question:  Specimen collection method  Answer:  Lab=Lab collect   05/22/20 1332   05/22/20 1333  CBC  Once,   R       Question:  Specimen collection method  Answer:  Lab=Lab collect   05/22/20 1332   05/22/20 0944  Acid Fast Culture with reflexed sensitivities  RELEASE UPON ORDERING,   TIMED       Comments: Specimen A: Phone [phone]         Previous Biopsy:  no Is the patient on airborne/droplet precautions? No           Clinical History:  N/A Copy of Report to:  N/A Specimen Disposition: OR Specimen Holding     05/22/20 0944   05/22/20 0944  Acid Fast Smear (AFB)  RELEASE UPON ORDERING,   TIMED       Comments: Specimen A: Phone [phone]         Previous Biopsy:  no Is the patient on airborne/droplet precautions? No           Clinical History:  N/A Copy of Report to:  N/A Specimen Disposition: OR Specimen Holding     05/22/20 0944   05/22/20 0944  Body fluid cell count with differential  RELEASE UPON ORDERING,   TIMED       Comments: Specimen A: Phone  [phone]         Previous Biopsy:  no Is the patient on airborne/droplet precautions? No           Clinical History:  N/A Copy of Report to:  N/A Specimen Disposition: OR Specimen Holding     05/22/20 0944   05/22/20 0944  Culture, Respiratory w Gram Stain  RELEASE UPON ORDERING,   TIMED       Comments: Specimen A: Phone [phone]         Previous Biopsy:  no Is the patient on airborne/droplet precautions? No           Clinical History:  N/A Copy of Report to:  N/A Specimen Disposition: OR Specimen Holding     05/22/20 0944   05/22/20 2683  Basic metabolic panel  Daily,   R     Question:  Specimen collection method  Answer:  Lab=Lab collect   05/21/20 0954   05/20/20 1507  Fungus Culture With Stain  RELEASE UPON ORDERING,   TIMED        05/20/20 1507   05/20/20 1507  Acid Fast Culture with reflexed sensitivities  RELEASE UPON ORDERING,   TIMED        05/20/20 1507   05/20/20 0500  CBC with Differential/Platelet  Daily,   R      05/19/20 1732   05/20/20 0500  Magnesium  Daily,   R      05/19/20 1732   05/20/20 0500  Phosphorus  Daily,   R      05/19/20 1732   Signed and Held  Renal function panel  Once,   R        Signed and Held   Signed and Held  CBC  Once,   R        Signed and Held         Medications reviewed:  Scheduled Meds: . sodium chloride   Intravenous Once  . acidophilus  1 capsule Oral Daily  . atorvastatin  80 mg Oral Daily  . Chlorhexidine Gluconate Cloth  6 each Topical Q0600  . Chlorhexidine Gluconate Cloth  6 each Topical Q0600  . cinacalcet  60 mg Oral Q M,W,F  . darbepoetin (ARANESP) injection - DIALYSIS  200 mcg Intravenous Q Wed-HD  . doxercalciferol  2 mcg Intravenous Q M,W,F-HD  .  famotidine  20 mg Oral Q M,W,F  . feeding supplement (NEPRO CARB STEADY)  237 mL Oral BID BM  . mometasone-formoterol  2 puff Inhalation BID  . multivitamin  1 tablet Oral QHS  . ticagrelor  90 mg Oral BID  . umeclidinium bromide  1 puff Inhalation Daily    Continuous Infusions: . sodium chloride 250 mL (05/22/20 0847)  . ceFEPime (MAXIPIME) IV Stopped (05/21/20 2302)  . ferric gluconate (FERRLECIT/NULECIT) IV Stopped (05/21/20 1842)  . vancomycin     Consultants:see note  Procedures:see note  Antimicrobials: Anti-infectives (From admission, onward)   Start     Dose/Rate Route Frequency Ordered Stop   05/22/20 1200  vancomycin (VANCOCIN) IVPB 750 mg/150 ml premix        750 mg 150 mL/hr over 60 Minutes Intravenous Every M-W-F (Hemodialysis) 05/20/20 1019     05/20/20 2200  ceFEPIme (MAXIPIME) 1 g in sodium chloride 0.9 % 100 mL IVPB        1 g 200 mL/hr over 30 Minutes Intravenous Every 24 hours 05/20/20 1015     05/20/20 1200  vancomycin (VANCOCIN) IVPB 500 mg/100 ml premix        500 mg 100 mL/hr over 60 Minutes Intravenous Every Wed (Hemodialysis) 05/20/20 1015 05/20/20 1150   05/20/20 1016  vancomycin (VANCOCIN) 500-5 MG/100ML-% IVPB       Note to Pharmacy: Judieth Keens  : cabinet override      05/20/20 1016 05/20/20 1047   05/19/20 0430  vancomycin (VANCOREADY) IVPB 1000 mg/200 mL  Status:  Discontinued        1,000 mg 200 mL/hr over 60 Minutes Intravenous  Once 05/19/20 0337 05/19/20 0338   05/19/20 0430  ceFEPIme (MAXIPIME) 2 g in sodium chloride 0.9 % 100 mL IVPB        2 g 200 mL/hr over 30 Minutes Intravenous  Once 05/19/20 0337 05/19/20 0610   05/19/20 0430  vancomycin (VANCOREADY) IVPB 1250 mg/250 mL        1,250 mg 166.7 mL/hr over 90 Minutes Intravenous  Once 05/19/20 7793 05/19/20 0802     Culture/Microbiology    Component Value Date/Time   SDES  05/19/2020 1344    URINE, CLEAN CATCH Performed at [facility], [place] [address] [place], [place] [address]    SPECREQUEST  05/19/2020 1344    NONE Performed at [facility], [place] [address], [place], [place] [address]    CULT (A) 05/19/2020 1344    <10,000 COLONIES/mL INSIGNIFICANT GROWTH Performed at [facility] [address], [place], [place] [address]    REPTSTATUS 05/21/2020 FINAL 05/19/2020 1344    Other culture-see note  Objective: Vitals: Today's Vitals   05/22/20 1000 05/22/20 1013 05/22/20 1026 05/22/20 1123  BP: (!) 131/39 (!) 134/38 (!) 146/49 (!) 130/58  Pulse: 85 83 83 84  Resp: 17 (!) _0 Temp:   97.6 F (36.4 C) (!) 97.4 F (36.3 C)  TempSrc:   Axillary Oral  SpO2: 95% 90% 94% 95%  Weight:      Height:      PainSc:  0-No pain 0-No pain     Intake/Output Summary (Last 24 hours) at 05/22/2020 1456 Last data filed at 05/22/2020 0934 Gross per 24 hour  Intake 485.42 ml  Output 125 ml  Net 360.42 ml   Filed Weights   05/20/20 1205 05/21/20 0457 05/22/20 0429  Weight: 60 kg 57.6 kg 59.9 kg   Weight  change: -2.9 kg  Intake/Output from previous day: 03/03 0701 - 03/04 0700 In: 525.4 [P.O.:240; I.V.:65.4; IV Piggyback:220] Out: 125 [Urine:125] Intake/Output this shift: Total I/O In: 200 [I.V.:200] Out: 0  Filed Weights   05/20/20 1205 05/21/20 0457 05/22/20 0429  Weight: 60 kg 57.6 kg 59.9 kg    Examination: General exam: AAOx3,NAD, weak appearing. HEENT:Oral mucosa moist, Ear/Nose WNL grossly, dentition normal. Respiratory system: bilaterally diminished,no wheezing or crackles,no use of accessory muscle Cardiovascular system: S1 & S2 +, No JVD,. Gastrointestinal system: Abdomen soft, NT,ND, BS+ Nervous System:Alert, awake, moving extremities and grossly nonfocal Extremities: No edema, distal peripheral pulses palpable.  Skin: No rashes,no icterus. MSK: Normal muscle bulk,tone, power RUE graft for HD   Data Reviewed: I have personally reviewed following labs and imaging studies CBC: Recent Labs  Lab 05/19/20 0510 05/19/20 2201 05/20/20 0312 05/21/20 0120 05/21/20 2226  WBC 9.6 7.7 8.6 8.1 7.6  NEUTROABS  --  6.3 7.2 6.6 6.1  HGB 5.5* 7.8* 7.6* 8.2* 8.6*  HCT 18.5* 24.3* 23.2* 26.4* 28.3*  MCV 94.9 91.7 90.6 92.6 95.0  PLT 196 212 219  198 102   Basic Metabolic Panel: Recent Labs  Lab 05/19/20 0510 05/19/20 0517 05/20/20 0312 05/21/20 0120 05/21/20 2226  NA 138 138 135 136 131*  K 3.1* 3.1* 3.3* 3.2* 3.3*  CL 95* 95* 95* 99 93*  CO2 _0 GLUCOSE 111* 113* 170* 192* 273*  BUN 31* 33* 51* 28* 43*  CREATININE 4.06* 4.15* 5.75* 4.37* 5.70*  CALCIUM 7.9* 8.1* 7.5* 8.0* 8.2*  MG 1.9  --  2.0 1.9 2.0  PHOS  --   --  4.2 2.7 2.0*   GFR: Estimated Creatinine Clearance: 10.9 mL/min (A) (by C-G formula based on SCr of 5.7 mg/dL (H)). Liver Function Tests: Recent Labs  Lab 05/19/20 0510  AST 47*  ALT 29  ALKPHOS 121  BILITOT 0.9  PROT 6.8  ALBUMIN 2.7*   No results for input(s): LIPASE, AMYLASE in the last 168 hours. No results for input(s): AMMONIA in the last 168 hours. Coagulation Profile: No results for input(s): INR, PROTIME in the last 168 hours. Cardiac Enzymes: No results for input(s): CKTOTAL, CKMB, CKMBINDEX, TROPONINI in the last 168 hours. BNP (last 3 results) No results for input(s): PROBNP in the last 8760 hours. HbA1C: No results for input(s): HGBA1C in the last 72 hours. CBG: No results for input(s): GLUCAP in the last 168 hours. Lipid Profile: No results for input(s): CHOL, HDL, LDLCALC, TRIG, CHOLHDL, LDLDIRECT in the last 72 hours. Thyroid Function Tests: No results for input(s): TSH, T4TOTAL, FREET4, T3FREE, THYROIDAB in the last 72 hours. Anemia Panel: No results for input(s): VITAMINB12, FOLATE, FERRITIN, TIBC, IRON, RETICCTPCT in the last 72 hours. Sepsis Labs: Recent Labs  Lab 05/19/20 0510 05/19/20 0517 05/20/20 0312  PROCALCITON 23.01 23.33 38.64  LATICACIDVEN 1.4  --   --     Recent Results (from the past 240 hour(s))  Culture, blood (x 2)     Status: None (Preliminary result)   Collection Time: 05/19/20  5:10 AM   Specimen: BLOOD LEFT HAND  Result Value Ref Range Status   Specimen Description   Final    BLOOD LEFT HAND Performed at [facility] [address], [place], [place] [address]    Special Requests   Final    BOTTLES DRAWN AEROBIC ONLY Blood Culture adequate volume Performed at [facility] [address] [place],  [place] [address]    Culture   Final    NO GROWTH 3 DAYS Performed at [facility], [place] [address], [place], [place] [address]    Report Status PENDING  Incomplete  Culture, blood (x 2)     Status: None (Preliminary result)   Collection Time: 05/19/20  5:23 AM   Specimen: BLOOD  Result Value Ref Range Status   Specimen Description   Final    BLOOD LEFT ANTECUBITAL Performed at [facility] [address] [place], [place] [address]    Special Requests   Final    BOTTLES DRAWN AEROBIC AND ANAEROBIC Blood Culture adequate volume Performed at [facility] [address], [place], [place] [address]    Culture   Final    NO GROWTH 3 DAYS Performed at [facility], [place] [address] [place], [place] [address]    Report Status PENDING  Incomplete  Culture, sputum-assessment     Status: None   Collection Time: 05/19/20 10:07 AM   Specimen: Sputum  Result Value Ref Range Status   Specimen Description SPUTUM  Final   Special Requests NONE  Final   Sputum evaluation   Final    THIS SPECIMEN IS ACCEPTABLE FOR SPUTUM CULTURE Performed at [facility], [place] [address] [place] [address], [place], [place] [address]    Report Status 05/19/2020 FINAL  Final  Culture, Respiratory w Gram Stain     Status: None   Collection Time: 05/19/20 10:07 AM   Specimen: SPU  Result Value Ref Range Status   Specimen Description   Final    SPUTUM Performed at [facility] [address] [place], [place] [address]    Special Requests   Final    NONE Reflexed from [id number] Performed at [facility] [address], [place], [place] [address]    Gram Stain   Final    RARE WBC PRESENT, PREDOMINANTLY  MONONUCLEAR FEW SQUAMOUS EPITHELIAL CELLS PRESENT NO ORGANISMS SEEN    Culture   Final    MODERATE Normal respiratory flora-no Staph aureus or Pseudomonas seen Performed at [facility], [address], [place], [place] [address]    Report Status 05/21/2020 FINAL  Final  SARS CORONAVIRUS 2 (TAT 6-24 HRS) Nasopharyngeal Nasopharyngeal Swab     Status: None   Collection Time: 05/19/20 11:24 AM   Specimen: Nasopharyngeal Swab  Result Value Ref Range Status   SARS Coronavirus 2 NEGATIVE NEGATIVE Final    Comment: (NOTE) SARS-CoV-2 target nucleic acids are NOT DETECTED.  The SARS-CoV-2 RNA is generally detectable in upper and lower respiratory specimens during the acute phase of infection. Negative results do not preclude SARS-CoV-2 infection, do not rule out co-infections with other pathogens, and should not be used as the sole basis for treatment or other patient management decisions. Negative results must be combined with clinical observations, patient history, and epidemiological information. The expected result is Negative.  Fact Sheet for Patients: SugarRoll.be  Fact Sheet for Healthcare Providers: https://www.woods-mathews.com/  This test is not yet approved or cleared by the [place] FDA and  has been authorized for detection and/or diagnosis of SARS-CoV-2 by FDA under an Emergency Use Authorization (EUA). This EUA will remain  in effect (meaning this test can be used) for the duration of the COVID-19 declaration under Se ction 564(b)(1) of the Act, 21 U.S.C. section 360bbb-3(b)(1), unless the authorization is terminated or revoked sooner.  Performed at [facility], [place] [address], [place], [place] [address]   Urine culture     Status: Abnormal   Collection Time: 05/19/20  1:44 PM   Specimen: Urine, Clean Catch  Result Value Ref Range Status   Specimen Description   Final    URINE, CLEAN CATCH Performed at  [facility], [place] [address] [place], [place] [address]    Special Requests   Final    NONE Performed at [facility], [place] [address] [place], [place] [address]    Culture (A)  Final    <10,000 COLONIES/mL INSIGNIFICANT GROWTH Performed at [facility] [address] [place], [place] [address]    Report Status 05/21/2020 FINAL  Final  MRSA PCR Screening     Status: None   Collection Time: 05/19/20  5:59 PM   Specimen: Nasal Mucosa; Nasopharyngeal  Result Value Ref Range Status   MRSA by PCR NEGATIVE NEGATIVE Final    Comment:        The GeneXpert MRSA Assay (FDA approved for NASAL specimens only), is one component of a comprehensive MRSA colonization surveillance program. It is not intended to diagnose MRSA infection nor to guide or monitor treatment for MRSA infections. Performed at [facility], [place] [address] [place], [place] [address]   Acid Fast Smear (AFB)     Status: None   Collection Time: 05/20/20  3:07 PM   Specimen: Lung, Right; Pleural Fluid  Result Value Ref Range Status   AFB Specimen Processing Concentration  Final   Acid Fast Smear Negative  Final    Comment: (NOTE) Performed At: [facility] [place], [place] [id number] Rush Farmer MD JK:[id number]    Source (AFB) FLUID  Final    Comment: PLEURAL RIGHT Performed at [facility], [place] [address] [place], [place] [address]      Radiology Studies: DG Chest 1 View  Result Date: 05/20/2020 CLINICAL DATA:  Post right thoracentesis. EXAM: CHEST  1 VIEW COMPARISON:  05/19/2020. FINDINGS: Heart size stable. Diffuse bilateral pulmonary infiltrates/edema with slight interim improvement from prior exam. Improvement right pleural effusion. Stable small left pleural effusion. No pneumothorax post thoracentesis. Bilateral upper extremity stents noted. Peripheral vascular calcification. IMPRESSION: 1. No pneumothorax post  thoracentesis. 2. Diffuse bilateral pulmonary infiltrates/edema with slight interim improvement from prior exam. Improvement and right pleural effusion. Stable small left pleural effusion. Electronically Signed   By: Marcello Moores  Register   On: 05/20/2020 15:42   CT CHEST WO CONTRAST  Result Date: 05/22/2020 CLINICAL DATA:  Follow-up pneumonia EXAM: CT CHEST WITHOUT CONTRAST TECHNIQUE: Multidetector CT imaging of the chest was performed following the standard protocol without IV contrast. COMPARISON:  Plain film from 05/20/2020 FINDINGS: Cardiovascular: Somewhat limited due to lack of IV contrast. Mild decreased attenuation of the cardiac blood pool is noted consistent with underlying anemia. Diffuse coronary and aortic calcifications are noted. No aneurysmal dilatation is seen. Vascular stenting is noted in the right arm. Mediastinum/Nodes: Thoracic inlet is within normal limits. Scattered small hilar and mediastinal lymph nodes are seen likely reactive in nature. Lungs/Pleura: Bilateral pleural effusions are noted right considerably greater than left. Right lower lobe consolidation is noted consistent with acute pneumonia. Additionally changes of central vascular congestion and edema are noted similar to that seen on prior plain film examination. This may be related to a degree of volume overload. Upper Abdomen: Visualized upper abdomen shows no acute abnormality. Musculoskeletal: No acute bony abnormality is  noted. Degenerative changes of the thoracic spine are seen. 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 IMPRESSION: Right lower lobe pneumonia. Bilateral pleural effusions and findings suggestive of CHF similar to that noted on prior plain film examination. Aortic Atherosclerosis (ICD10-I70.0). Electronically Signed   By: Inez Catalina M.D.   On: 05/22/2020 05:21   IR THORACENTESIS ASP PLEURAL SPACE W/IMG GUIDE  Result Date: 05/20/2020 INDICATION: Shortness of breath. Right pleural effusions.  Request made for therapeutic and diagnostic thoracentesis. EXAM: ULTRASOUND GUIDED RIGHT THORACENTESIS MEDICATIONS: 10 mL 1% lidocaine COMPLICATIONS: None immediate. PROCEDURE: An ultrasound guided thoracentesis was thoroughly discussed with the patient and questions answered. The benefits, risks, alternatives and complications were also discussed. The patient understands and wishes to proceed with the procedure. Written consent was obtained. Ultrasound was performed to localize and mark an adequate pocket of fluid in the right chest. The area was then prepped and draped in the normal sterile fashion. 1% Lidocaine was used for local anesthesia. Under ultrasound guidance a 6 Fr Safe-T-Centesis catheter was introduced. Thoracentesis was performed. The catheter was removed and a dressing applied. FINDINGS: A total of approximately 1.3 L of clear yellow fluid was removed. Samples were sent to the laboratory as requested by the clinical team. Post procedure chest X-ray reviewed, negative for pneumothorax. IMPRESSION: Successful ultrasound guided right thoracentesis yielding 1.3 L of pleural fluid. Read by: Durenda [place], PA-C Electronically Signed   By: Sandi Mariscal M.D.   On: 05/20/2020 15:53     LOS: 3 days   Antonieta Pert, MD [facility]  05/22/2020, 2:56 PM

## 2020-05-22 NOTE — Transfer of Care (Signed)
Immediate Anesthesia Transfer of Care Note  Patient: Brandon Chaney  Procedure(s) Performed: VIDEO BRONCHOSCOPY WITHOUT FLUORO (N/A )  Patient Location: PACU and Endoscopy Unit  Anesthesia Type:MAC  Level of Consciousness: awake, alert  and oriented  Airway & Oxygen Therapy: Patient Spontanous Breathing and Patient connected to face mask oxygen  Post-op Assessment: Report given to RN and Post -op Vital signs reviewed and stable  Post vital signs: Reviewed and stable  Last Vitals:  Vitals Value Taken Time  BP    Temp    Pulse 82 05/22/20 0948  Resp 21 05/22/20 0948  SpO2 93 % 05/22/20 0948  Vitals shown include unvalidated device data.  Last Pain:  Vitals:   05/22/20 0823  TempSrc: Oral  PainSc: 0-No pain      Patients Stated Pain Goal: 0 (24/81/85 9093)  Complications: No complications documented.

## 2020-05-22 NOTE — Anesthesia Postprocedure Evaluation (Signed)
Anesthesia Post Note  Patient: Brandon Chaney  Procedure(s) Performed: VIDEO BRONCHOSCOPY WITHOUT FLUORO (N/A ) BRONCHIAL WASHINGS (Right )     Patient location during evaluation: PACU Anesthesia Type: MAC Level of consciousness: awake and alert and oriented Pain management: pain level controlled Vital Signs Assessment: post-procedure vital signs reviewed and stable Respiratory status: spontaneous breathing, nonlabored ventilation and respiratory function stable Cardiovascular status: blood pressure returned to baseline Postop Assessment: no apparent nausea or vomiting Anesthetic complications: no   No complications documented.  Last Vitals:  Vitals:   05/22/20 1013 05/22/20 1026  BP: (!) 134/38 (!) 146/49  Pulse: 83 83  Resp: (!) 28 17  Temp:  36.4 C  SpO2: 90% 94%    Last Pain:  Vitals:   05/22/20 1026  TempSrc: Axillary  PainSc: 0-No pain                 Brennan Bailey

## 2020-05-22 NOTE — Op Note (Addendum)
[facility] Patient Name: Brandon Chaney Date: 05/22/2020 MRN: [id number] Attending MD: Senaida Ores. Shearon Stalls MD, MD Date of Birth: August 24, 1955 CSN: Finalized Age: 65 Admit Type: Inpatient Gender: Male Procedure:             Bronchoscopy Indications:           Hemoptysis with abnormal CXR Providers:             Senaida Ores. Shearon Stalls MD, MD, Erenest Rasher, RN, Faustina                         Mbumina, Technician Referring MD:           Medicines:             See the Anesthesia note for documentation of the                         administered medications Complications:         No immediate complications Estimated Blood Loss:  Estimated blood loss was minimal. Procedure:             Pre-Anesthesia Assessment:                        - Prior to the procedure, a History and Physical was                         performed, and patient medications and allergies were                         reviewed. The patient's tolerance of previous                         anesthesia was also reviewed. The risks and benefits                         of the procedure and the sedation options and risks                         were discussed with the patient. All questions were                         answered, and informed consent was obtained. Prior                         Anticoagulants: The patient has taken no previous                         anticoagulant or antiplatelet agents. ASA Grade                         Assessment: III - A patient with severe systemic                         disease. After reviewing the risks and benefits, the                         patient was deemed in satisfactory condition to  undergo the procedure.                        After obtaining informed consent, the bronchoscope was                         passed under direct vision. Throughout the procedure,                         the patient's blood pressure, pulse, and oxygen                          saturations were monitored continuously. the BF-H190                         ([id number]) Olympus Diagnostic Bronchoscope was                         introduced through the left nostril and advanced to                         the tracheobronchial tree. The patient tolerated the                         procedure well. Scope In: Scope Out: Findings:      The nasopharynx/oropharynx appears normal. The larynx appears normal.       The vocal cords were notable for a small polyp on the left anterior       fold. The subglottic space is normal. The trachea is of normal caliber.       The carina is sharp. The tracheobronchial tree was examined to at least       the first subsegmental level. The airways were pale and sickly in       appearance. Bronchial anatomy was normal; there are no endobronchial       lesions, and trace secretions.      Bronchoalveolar lavage was performed in the RML lateral segment (B4) of       the lung and sent for cell count, bacterial culture, AFB analysis and       cytology. 90 mL of fluid were instilled. 40 mL were returned. The return       was blood-tinged. There were no mucoid plugs in the return fluid. Impression:            - Hemoptysis with abnormal CXR                        - The airway examination was normal.                        - Bronchoalveolar lavage was performed.                        - The airway examination was normal. Moderate Sedation:      see anesthesia documentation Recommendation:        - Await BAL results.                        - Return patient to [facility] ward for ongoing care. Procedure Code(s):     --- Professional ---  31624, Bronchoscopy, rigid or flexible, including                         fluoroscopic guidance, when performed; with bronchial                         alveolar lavage Diagnosis Code(s):     --- Professional ---                        R04.2, Hemoptysis                         R91.8, Other nonspecific abnormal finding of lung field CPT copyright 2019 [employer]. All rights reserved. The codes documented in this report are preliminary and upon coder review may  be revised to meet current compliance requirements. Juell Radney S. Shearon Stalls MD, MD 05/22/2020 9:46:32 AM This report has been signed electronically. Number of Addenda: 0

## 2020-05-23 DIAGNOSIS — A419 Sepsis, unspecified organism: Secondary | ICD-10-CM | POA: Diagnosis not present

## 2020-05-23 DIAGNOSIS — J189 Pneumonia, unspecified organism: Secondary | ICD-10-CM | POA: Diagnosis not present

## 2020-05-23 LAB — ACID FAST SMEAR (AFB, MYCOBACTERIA): Acid Fast Smear: NEGATIVE

## 2020-05-23 LAB — TYPE AND SCREEN
ABO/RH(D): A POS
Antibody Screen: NEGATIVE
Unit division: 0
Unit division: 0
Unit division: 0

## 2020-05-23 LAB — BASIC METABOLIC PANEL
Anion gap: 11 (ref 5–15)
BUN: 28 mg/dL — ABNORMAL HIGH (ref 8–23)
CO2: 24 mmol/L (ref 22–32)
Calcium: 7.8 mg/dL — ABNORMAL LOW (ref 8.9–10.3)
Chloride: 97 mmol/L — ABNORMAL LOW (ref 98–111)
Creatinine, Ser: 3.82 mg/dL — ABNORMAL HIGH (ref 0.61–1.24)
GFR, Estimated: 17 mL/min — ABNORMAL LOW (ref >60)
Glucose, Bld: 303 mg/dL — ABNORMAL HIGH (ref 70–99)
Potassium: 3.8 mmol/L (ref 3.5–5.1)
Sodium: 132 mmol/L — ABNORMAL LOW (ref 135–145)

## 2020-05-23 LAB — GLUCOSE, CAPILLARY
Glucose-Capillary: 159 mg/dL — ABNORMAL HIGH (ref 70–99)
Glucose-Capillary: 290 mg/dL — ABNORMAL HIGH (ref 70–99)

## 2020-05-23 LAB — BPAM RBC
Blood Product Expiration Date: 202203222359
Blood Product Expiration Date: 202203232359
Blood Product Expiration Date: 202203232359
ISSUE DATE / TIME: 202203010911
ISSUE DATE / TIME: 202203011549
Unit Type and Rh: 6200
Unit Type and Rh: 6200
Unit Type and Rh: 6200

## 2020-05-23 LAB — PHOSPHORUS: Phosphorus: 1.7 mg/dL — ABNORMAL LOW (ref 2.5–4.6)

## 2020-05-23 LAB — MAGNESIUM: Magnesium: 1.8 mg/dL (ref 1.7–2.4)

## 2020-05-23 MED ORDER — METOPROLOL SUCCINATE ER 25 MG PO TB24
12.5000 mg | ORAL_TABLET | Freq: Every day | ORAL | Status: DC
  Administered 2020-05-23 – 2020-05-29 (×7): 12.5 mg via ORAL
  Filled 2020-05-23 (×7): qty 1

## 2020-05-23 MED ORDER — INSULIN ASPART 100 UNIT/ML ~~LOC~~ SOLN: 0.0000 [IU] | Freq: Three times a day (TID) | SUBCUTANEOUS | Status: DC

## 2020-05-23 MED ORDER — CHLORHEXIDINE GLUCONATE CLOTH 2 % EX PADS: 6.0000 | MEDICATED_PAD | Freq: Every day | CUTANEOUS | Status: DC

## 2020-05-23 MED ORDER — ASPIRIN EC 81 MG PO TBEC
81.0000 mg | DELAYED_RELEASE_TABLET | Freq: Every day | ORAL | Status: DC
  Administered 2020-05-23 – 2020-05-30 (×8): 81 mg via ORAL
  Filled 2020-05-23 (×8): qty 1

## 2020-05-23 NOTE — Progress Notes (Signed)
Blood sugar was high in am. Notified Dr. Lupita Leash for insulin sliding scale and checking the CBG. Patient refused insulin because he had hypoglycemic experience in the past and his wife also refused insulin as well as they don't want to renal carb modified diet. Changed renal diet. HS [employer]

## 2020-05-23 NOTE — Progress Notes (Signed)
PROGRESS NOTE    Brandon Chaney  GMW:[id number] DOB: 1955/07/12 DOA: 05/19/2020 PCP: Patient, No Pcp Per   Brief Narrative: 65 year old male with history of ESRD on HD, CAD with stent history of zoster ophthalmicus, recent hospital admission for pneumonia and symptomatic anemia and hypoxia presented to the ED for evaluation of chest pain shortness of breath and persistent cough. Patient reports that he was admitted to [facility] roughly 2 weeks PTA- treated for pneumonia, transfused 3 units of RBC, and went home with supplemental oxygen. He continued to feel poorly, has had ongoing productive cough with sputum and small blood clots since prior to the hospitalization, and then developed chest pain and worsening shortness of breath and general malaise and fatigue over the past 2 days. , has not noted any melena or hematochezia.   [facility]Upon arrival to the ED, patient is found to be febrile 102.35F, saturating 87% on 2 L/min of supplemental oxygen, tachypneic in the mid 20s, and with blood pressure 93/40. EKG features sinus tachycardia with nonspecific IVCD and borderline ST depressions in the lateral leads. Chest x-ray was read as bilateral upper lobe infiltrates with right greater than left effusions. Chemistry panel features a potassium of 3.1, bicarbonate 25, and creatinine 3.20. CBC notable for leukocytosis to 11,000 and a normocytic anemia with hemoglobin 6.2. Lactic acid is 2.2 and procalcitonin 1.49. Hs-CRP elevated 49.88 and high-sensitivity troponin 113. INR was 1.3. Covid and influenza PCR was negative. Blood cultures were collected in the emergency department and the patient was treated with 500 cc LR, aspirin, Rocephin, acetaminophen, and vancomycin. He was transferred to [facility] for admission- then to [facility] due to ESRD status  As per chart, in media section: Patient was admitted from 1/16- 1/22 and 1/26 through 2/11: He was found to have  an EF of 20 to 25% down from 45 to 50%, anemia and pneumonia.  Patient was seen by cardiology and it was decided that due to his high risk of bleeding noninvasive management was to be done.  Patient was also seen by ID for Citrobacter koseri that grew on his culture after he underwent bronchoscopy.  He was treated with ceftriaxone and oral cefdinir. He also went home on O2. At [facility]: "Blood cultures were no growth COVID-19 PCR negative AFB negative x4. QuantiFERON-TB negative. BAL culture grew Citrobacter koseri BAL cytology showed no atypical cells but numerous alveolar macrophages with many hemosiderin ANA negative, antidouble-stranded DNA negative, anti-CCP negative, rheumatoid factor marginally positive --> Less likely autoimmune disease" sent home on cefdinir-2/14 Since admission 3/2: Rt-sided thoracentesis with 1.3 fluid removal Ongoing hemoptysis,pulmonary was consulted, bronchoscopy w/ BAL performed 3/4  Subjective: Seen and examined this morning Up in the room brushing his stated reports some cough some hemoptysis -reports it is improving.   ON 2L [place]  Assessment & Plan:  Sepsis due to pneumonia POA Recently treated pneumonia at [facility] hospital Persistent hemoptysis Procalcitonin has been elevated 23-38, no leukocytosis, afebrile.Sputum culture moderate normal respiratory flora no staph or Pseudomonas.  Blood culture no growth so far.  Recently had bronchoscopy at [facility].  ANCA negative, QuantiFERON negative had Citrobacter koseri in bronch culture.  Patient was transferred for second opinion at [facility].  Seen by pulmonary underwent, CT chest 3/3- RLL pneumonia, bilateral pleural effusions findings suggestive of CHF similar to prior exam and is s/p bronchoscopy 3/4-normal airway, BAL was performed, BAL culture/results pending.  Keep on empiric vancomycin/cefepime.Patient was on aspirin/Brilinta and prior hemoptysis was thought to  be due to this. Recent Labs  Lab 05/19/20 0510  05/19/20 0517 05/19/20 2201 05/20/20 0312 05/21/20 0120 05/21/20 2226 05/22/20 1417  WBC 9.6  --  7.7 8.6 8.1 7.6 7.8  LATICACIDVEN 1.4  --   --   --   --   --   --   PROCALCITON 23.01 23.33  --  38.64  --   --   --    Acute on chronic hypoxic respiratory failure/shortness of breath: Due to pneumonia/pleural effusion/anemia.VQ scan not suspicious for PE.  Feels better after thoracentesis.  Continue to adjust fluid received with dialysis continue antibiotic for pneumonia.  Check proBNP-but is on HD.  Moderate pleural effusion on the right status post thoracentesis 1.3 L removed 3/2, LDH low 106 Albumin 1.1, protein <3, appears transudative likely from fluid issue with ESRD on dialysis.  Continue dialysis we will check proBNP.  Symptomatic anemia: 5.5 on admission improved 8.2.  Status post 2 units PRBC on 3/1.  At [facility] she had  5 units PRBC total)- combination of anemia of chronic disease, and persistent hempotysis.Hemoccult was negative here too.  Recent iron work-up at [facility]. Iv iron given by nephro 05/19/20.H&H holding stable above 8 g.  Monitor.  Resume aspirin and continue continue Brilinta and see how Hb does Recent Labs  Lab 05/19/20 2201 05/20/20 0312 05/21/20 0120 05/21/20 2226 05/22/20 1417  HGB 7.8* 7.6* 8.2* 8.6* 8.0*  HCT 24.3* 23.2* 26.4* 28.3* 24.2*   ESRD on HD normally on MWF: Last dialysis yesterday.  Nephro following closely to ultrafiltrate as able to help with pleural effusion  Metabolic bone disease continue home dose of Hectorol Sensipar but holding PhosLo-defer to nephrology.  Hypertension blood pressure is stable and holding antihypertensive to give room for ultrafiltration  Chest pain with mildly elevated troponin, in the setting of ESRD. CAD , patient reports he had stenting December 7341 Chronic systolic CHF EF (25 to 93% worse from 45 to 50% on recent admission in Feb at [facility] hosp) Was seen by cardiology during prior admission at [facility] and cardiology  was treating her medically as patient has very high risk for bleeding with a left heart cath and plan is for outpatient cardiology follow-up.  Patient was on aspirin Brilinta statin and beta-blocker.  Will resume aspirin continue Brilinta, Lipitor, resume his toprol at 12.5 mg (normally on 25 mg) and cont to hold lisinopril 10 mg to allow room for ultrafiltration. Denies any chest pain at this time.  COPD not in exacerbation,continue ICS/LABA as needed albuterol  Hyokalemia-resolved  Severe protein calorie malnutrition see below.  Augment diet  Diet Order            Diet renal with fluid restriction Fluid restriction: 1200 mL Fluid; Room service appropriate? Yes; Fluid consistency: Thin  Diet effective now                 Nutrition Problem: Severe Malnutrition Etiology: chronic illness (ESRD on HD, COPD) Signs/Symptoms: severe fat depletion,severe muscle depletion,moderate fat depletion,moderate muscle depletion Interventions: Nepro shake,Other (Comment) (Rena-Vite) Patient's Body mass index is 19.7 kg/m.  DVT prophylaxis: SCDs Start: 05/19/20 7902 Code Status:   Code Status: Full Code  Family Communication: plan of care discussed with patient at bedside.  I discussed and updated patient's wife over the phone 3/3  . Status is: Inpatient Remains inpatient appropriate because:Ongoing diagnostic testing needed not appropriate for outpatient work up, IV treatments appropriate due to intensity of illness or inability to take PO and  Inpatient level of care appropriate due to severity of illness  Dispo: The patient is from: Home              Anticipated d/c is to: Home once okay w/ pulm, pending BAL results.              Patient currently is not medically stable to d/c.   Difficult to place patient No    Unresulted Labs (From admission, onward)          Start     Ordered   05/22/20 0944  Acid Fast Culture with reflexed sensitivities  RELEASE UPON ORDERING,   TIMED       Comments:  Specimen A: Phone [phone]         Previous Biopsy:  no Is the patient on airborne/droplet precautions? No           Clinical History:  N/A Copy of Report to:  N/A Specimen Disposition: OR Specimen Holding     05/22/20 0944   05/22/20 0944  Acid Fast Smear (AFB)  RELEASE UPON ORDERING,   TIMED       Comments: Specimen A: Phone [phone]         Previous Biopsy:  no Is the patient on airborne/droplet precautions? No           Clinical History:  N/A Copy of Report to:  N/A Specimen Disposition: OR Specimen Holding     05/22/20 0944   05/22/20 8144  Basic metabolic panel  Daily,   R     Question:  Specimen collection method  Answer:  Lab=Lab collect   05/21/20 0954   05/20/20 1507  Fungus Culture With Stain  RELEASE UPON ORDERING,   TIMED        05/20/20 1507   05/20/20 1507  Acid Fast Culture with reflexed sensitivities  RELEASE UPON ORDERING,   TIMED        05/20/20 1507   05/20/20 0500  CBC with Differential/Platelet  Daily,   R      05/19/20 1732   05/20/20 0500  Magnesium  Daily,   R      05/19/20 1732   05/20/20 0500  Phosphorus  Daily,   R      05/19/20 1732   Signed and Held  Renal function panel  Once,   R        Signed and Held   Signed and Held  CBC  Once,   R        Signed and Held         Medications reviewed:  Scheduled Meds: . sodium chloride   Intravenous Once  . acidophilus  1 capsule Oral Daily  . atorvastatin  80 mg Oral Daily  . Chlorhexidine Gluconate Cloth  6 each Topical Q0600  . cinacalcet  60 mg Oral Q M,W,F  . darbepoetin (ARANESP) injection - DIALYSIS  200 mcg Intravenous Q Wed-HD  . doxercalciferol  2 mcg Intravenous Q M,W,F-HD  . famotidine  20 mg Oral Q M,W,F  . feeding supplement (NEPRO CARB STEADY)  237 mL Oral BID BM  . mometasone-formoterol  2 puff Inhalation BID  . multivitamin  1 tablet Oral QHS  . ticagrelor  90 mg Oral BID  . umeclidinium bromide  1 puff Inhalation Daily   Continuous Infusions: . sodium chloride 250 mL  (05/22/20 0847)  . ceFEPime (MAXIPIME) IV 1 g (05/22/20 2119)  . vancomycin     Consultants:see note  Procedures:see note  Antimicrobials: Anti-infectives (From admission, onward)   Start     Dose/Rate Route Frequency Ordered Stop   05/22/20 1200  vancomycin (VANCOCIN) IVPB 750 mg/150 ml premix        750 mg 150 mL/hr over 60 Minutes Intravenous Every M-W-F (Hemodialysis) 05/20/20 1019     05/20/20 2200  ceFEPIme (MAXIPIME) 1 g in sodium chloride 0.9 % 100 mL IVPB        1 g 200 mL/hr over 30 Minutes Intravenous Every 24 hours 05/20/20 1015     05/20/20 1200  vancomycin (VANCOCIN) IVPB 500 mg/100 ml premix        500 mg 100 mL/hr over 60 Minutes Intravenous Every Wed (Hemodialysis) 05/20/20 1015 05/20/20 1150   05/20/20 1016  vancomycin (VANCOCIN) 500-5 MG/100ML-% IVPB       Note to Pharmacy: Judieth Keens  : cabinet override      05/20/20 1016 05/20/20 1047   05/19/20 0430  vancomycin (VANCOREADY) IVPB 1000 mg/200 mL  Status:  Discontinued        1,000 mg 200 mL/hr over 60 Minutes Intravenous  Once 05/19/20 0337 05/19/20 0338   05/19/20 0430  ceFEPIme (MAXIPIME) 2 g in sodium chloride 0.9 % 100 mL IVPB        2 g 200 mL/hr over 30 Minutes Intravenous  Once 05/19/20 0337 05/19/20 0610   05/19/20 0430  vancomycin (VANCOREADY) IVPB 1250 mg/250 mL        1,250 mg 166.7 mL/hr over 90 Minutes Intravenous  Once 05/19/20 0339 05/19/20 0802     Culture/Microbiology    Component Value Date/Time   SDES BRONCHIAL ALVEOLAR LAVAGE 05/22/2020 0933   SPECREQUEST BRONCHIAL LAVAGE RIGHT MIDDLE LOBE SPEC A 05/22/2020 0933   CULT PENDING 05/22/2020 0933   REPTSTATUS PENDING 05/22/2020 0933    Other culture-see note  Objective: Vitals: Today's Vitals   05/22/20 1845 05/22/20 1932 05/22/20 2300 05/23/20 0300  BP: (!) 143/55 (!) 169/52 (!) 130/34 (!) 139/41  Pulse: 85 89 89 84  Resp: _0 Temp: 98.6 F (37 C) 98.4 F (36.9 C) 98.1 F (36.7 C) 98 F (36.7 C)  TempSrc:  Oral Oral Oral Oral  SpO2: 100% 97% 97% 100%  Weight:    60.5 kg  Height:      PainSc:  0-No pain      Intake/Output Summary (Last 24 hours) at 05/23/2020 0729 Last data filed at 05/23/2020 0400 Gross per 24 hour  Intake 200 ml  Output 2150 ml  Net -1950 ml   Filed Weights   05/21/20 0457 05/22/20 0429 05/23/20 0300  Weight: 57.6 kg 59.9 kg 60.5 kg   Weight change: 0.6 kg  Intake/Output from previous day: 03/04 0701 - 03/05 0700 In: 200 [I.V.:200] Out: 2150 [Urine:150] Intake/Output this shift: No intake/output data recorded. Filed Weights   05/21/20 0457 05/22/20 0429 05/23/20 0300  Weight: 57.6 kg 59.9 kg 60.5 kg    Examination: General exam: AAOx3,NAD, weak appearing. HEENT:Oral mucosa moist, Ear/Nose WNL grossly, dentition normal. Respiratory system: bilateral basal crackles + ,no wheezing or crackles,no use of accessory muscle Cardiovascular system: S1 & S2 +, No JVD,. Gastrointestinal system: Abdomen soft, NT,ND, BS+ Nervous System:Alert, awake, moving extremities and grossly nonfocal Extremities: No edema, distal peripheral pulses palpable.  Skin: No rashes,no icterus. MSK: Normal muscle bulk,tone, power RUE graft for HD   Data Reviewed: I have personally reviewed following labs and imaging studies CBC: Recent Labs  Lab 05/19/20 2201 05/20/20 0312 05/21/20 0120 05/21/20  2226 05/22/20 1417  WBC 7.7 8.6 8.1 7.6 7.8  NEUTROABS 6.3 7.2 6.6 6.1  --   HGB 7.8* 7.6* 8.2* 8.6* 8.0*  HCT 24.3* 23.2* 26.4* 28.3* 24.2*  MCV 91.7 90.6 92.6 95.0 90.0  PLT 212 219 198 223 025   Basic Metabolic Panel: Recent Labs  Lab 05/19/20 0510 05/19/20 0517 05/20/20 0312 05/21/20 0120 05/21/20 2226 05/22/20 1417 05/23/20 0109  NA 138   < > 135 136 131* 132* 132*  K 3.1*   < > 3.3* 3.2* 3.3* 3.6 3.8  CL 95*   < > 95* 99 93* 96* 97*  CO2 26   < > _0 21* 24  GLUCOSE 111*   < > 170* 192* 273* 325* 303*  BUN 31*   < > 51* 28* 43* 51* 28*  CREATININE 4.06*   < >  5.75* 4.37* 5.70* 6.75* 3.82*  CALCIUM 7.9*   < > 7.5* 8.0* 8.2* 8.0* 7.8*  MG 1.9  --  2.0 1.9 2.0  --  1.8  PHOS  --   --  4.2 2.7 2.0* 2.8 1.7*   < > = values in this interval not displayed.   GFR: Estimated Creatinine Clearance: 16.5 mL/min (A) (by C-G formula based on SCr of 3.82 mg/dL (H)). Liver Function Tests: Recent Labs  Lab 05/19/20 0510 05/22/20 1417  AST 47*  --   ALT 29  --   ALKPHOS 121  --   BILITOT 0.9  --   PROT 6.8  --   ALBUMIN 2.7* 2.2*   No results for input(s): LIPASE, AMYLASE in the last 168 hours. No results for input(s): AMMONIA in the last 168 hours. Coagulation Profile: No results for input(s): INR, PROTIME in the last 168 hours. Cardiac Enzymes: No results for input(s): CKTOTAL, CKMB, CKMBINDEX, TROPONINI in the last 168 hours. BNP (last 3 results) No results for input(s): PROBNP in the last 8760 hours. HbA1C: No results for input(s): HGBA1C in the last 72 hours. CBG: No results for input(s): GLUCAP in the last 168 hours. Lipid Profile: No results for input(s): CHOL, HDL, LDLCALC, TRIG, CHOLHDL, LDLDIRECT in the last 72 hours. Thyroid Function Tests: No results for input(s): TSH, T4TOTAL, FREET4, T3FREE, THYROIDAB in the last 72 hours. Anemia Panel: No results for input(s): VITAMINB12, FOLATE, FERRITIN, TIBC, IRON, RETICCTPCT in the last 72 hours. Sepsis Labs: Recent Labs  Lab 05/19/20 0510 05/19/20 0517 05/20/20 0312  PROCALCITON 23.01 23.33 38.64  LATICACIDVEN 1.4  --   --     Recent Results (from the past 240 hour(s))  Culture, blood (x 2)     Status: None (Preliminary result)   Collection Time: 05/19/20  5:10 AM   Specimen: BLOOD LEFT HAND  Result Value Ref Range Status   Specimen Description   Final    BLOOD LEFT HAND Performed at [facility] [address], [place], [place] [address]    Special Requests   Final    BOTTLES DRAWN AEROBIC ONLY Blood Culture adequate volume Performed at [facility] [address], [place], [place] [address]    Culture   Final    NO GROWTH 3 DAYS Performed at [facility], [place] [address] [place], [place] [address]    Report Status PENDING  Incomplete  Culture, blood (x 2)     Status: None (Preliminary result)   Collection Time: 05/19/20  5:23 AM   Specimen: BLOOD  Result Value Ref Range Status  Specimen Description   Final    BLOOD LEFT ANTECUBITAL Performed at [facility] [place] [address], [place], [place] [address]    Special Requests   Final    BOTTLES DRAWN AEROBIC AND ANAEROBIC Blood Culture adequate volume Performed at [facility] [address] [place], [place] [address]    Culture   Final    NO GROWTH 3 DAYS Performed at [facility], [place] [address] [place], [place] [address]    Report Status PENDING  Incomplete  Culture, sputum-assessment     Status: None   Collection Time: 05/19/20 10:07 AM   Specimen: Sputum  Result Value Ref Range Status   Specimen Description SPUTUM  Final   Special Requests NONE  Final   Sputum evaluation   Final    THIS SPECIMEN IS ACCEPTABLE FOR SPUTUM CULTURE Performed at [facility], [place] [address], [place], [place] [address]    Report Status 05/19/2020 FINAL  Final  Culture, Respiratory w Gram Stain     Status: None   Collection Time: 05/19/20 10:07 AM   Specimen: SPU  Result Value Ref Range Status   Specimen Description   Final    SPUTUM Performed at [facility] [address], [place], [place] [address]    Special Requests   Final    NONE Reflexed from [id number] Performed at [facility] [address], [place], [place] [address]    Gram Stain   Final    RARE WBC PRESENT, PREDOMINANTLY MONONUCLEAR FEW SQUAMOUS EPITHELIAL CELLS PRESENT NO ORGANISMS SEEN    Culture   Final    MODERATE Normal respiratory flora-no Staph aureus or Pseudomonas seen Performed at  [facility], [address], [place], [place] [address]    Report Status 05/21/2020 FINAL  Final  SARS CORONAVIRUS 2 (TAT 6-24 HRS) Nasopharyngeal Nasopharyngeal Swab     Status: None   Collection Time: 05/19/20 11:24 AM   Specimen: Nasopharyngeal Swab  Result Value Ref Range Status   SARS Coronavirus 2 NEGATIVE NEGATIVE Final    Comment: (NOTE) SARS-CoV-2 target nucleic acids are NOT DETECTED.  The SARS-CoV-2 RNA is generally detectable in upper and lower respiratory specimens during the acute phase of infection. Negative results do not preclude SARS-CoV-2 infection, do not rule out co-infections with other pathogens, and should not be used as the sole basis for treatment or other patient management decisions. Negative results must be combined with clinical observations, patient history, and epidemiological information. The expected result is Negative.  Fact Sheet for Patients: SugarRoll.be  Fact Sheet for Healthcare Providers: https://www.woods-mathews.com/  This test is not yet approved or cleared by the [place] FDA and  has been authorized for detection and/or diagnosis of SARS-CoV-2 by FDA under an Emergency Use Authorization (EUA). This EUA will remain  in effect (meaning this test can be used) for the duration of the COVID-19 declaration under Se ction 564(b)(1) of the Act, 21 U.S.C. section 360bbb-3(b)(1), unless the authorization is terminated or revoked sooner.  Performed at [facility], [place] [address] [place], [place] [address]   Urine culture     Status: Abnormal   Collection Time: 05/19/20  1:44 PM   Specimen: Urine, Clean Catch  Result Value Ref Range Status   Specimen Description   Final    URINE, CLEAN CATCH Performed at [facility], [place] [address], [place],  [address]    Special Requests  Final    NONE Performed at [facility], [place] [address] [place], [place] [address]    Culture (A)  Final    <10,000 COLONIES/mL INSIGNIFICANT GROWTH Performed at [facility] [address] [place], [place] [address]    Report Status 05/21/2020 FINAL  Final  MRSA PCR Screening     Status: None   Collection Time: 05/19/20  5:59 PM   Specimen: Nasal Mucosa; Nasopharyngeal  Result Value Ref Range Status   MRSA by PCR NEGATIVE NEGATIVE Final    Comment:        The GeneXpert MRSA Assay (FDA approved for NASAL specimens only), is one component of a comprehensive MRSA colonization surveillance program. It is not intended to diagnose MRSA infection nor to guide or monitor treatment for MRSA infections. Performed at [facility], [place] [address], [place], [place] [address]   Acid Fast Smear (AFB)     Status: None   Collection Time: 05/20/20  3:07 PM   Specimen: Lung, Right; Pleural Fluid  Result Value Ref Range Status   AFB Specimen Processing Concentration  Final   Acid Fast Smear Negative  Final    Comment: (NOTE) Performed At: [facility] [place], [place] [id number] Rush Farmer MD [id number]    Source (AFB) FLUID  Final    Comment: PLEURAL RIGHT Performed at [facility], [place] [address] [place], [place] [address]   Culture, Respiratory w Gram Stain     Status: None (Preliminary result)   Collection Time: 05/22/20  9:33 AM   Specimen: Bronchial Alveolar Lavage; Respiratory  Result Value Ref Range Status   Specimen Description BRONCHIAL ALVEOLAR LAVAGE  Final   Special Requests BRONCHIAL LAVAGE RIGHT MIDDLE LOBE SPEC A  Final   Gram Stain   Final    RARE WBC PRESENT,BOTH PMN AND MONONUCLEAR NO ORGANISMS SEEN Performed at [facility], [place] [address] [place], [place] [address]    Culture PENDING  Incomplete   Report Status PENDING  Incomplete     Radiology Studies: CT CHEST WO CONTRAST  Result Date: 05/22/2020 CLINICAL DATA:  Follow-up pneumonia EXAM: CT CHEST WITHOUT  CONTRAST TECHNIQUE: Multidetector CT imaging of the chest was performed following the standard protocol without IV contrast. COMPARISON:  Plain film from 05/20/2020 FINDINGS: Cardiovascular: Somewhat limited due to lack of IV contrast. Mild decreased attenuation of the cardiac blood pool is noted consistent with underlying anemia. Diffuse coronary and aortic calcifications are noted. No aneurysmal dilatation is seen. Vascular stenting is noted in the right arm. Mediastinum/Nodes: Thoracic inlet is within normal limits. Scattered small hilar and mediastinal lymph nodes are seen likely reactive in nature. Lungs/Pleura: Bilateral pleural effusions are noted right considerably greater than left. Right lower lobe consolidation is noted consistent with acute pneumonia. Additionally changes of central vascular congestion and edema are noted similar to that seen on prior plain film examination. This may be related to a degree of volume overload. Upper Abdomen: Visualized upper abdomen shows no acute abnormality. Musculoskeletal: No acute bony abnormality is noted. Degenerative changes of the thoracic spine are seen. 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 IMPRESSION: Right lower lobe pneumonia. Bilateral pleural effusions and findings suggestive of CHF similar to that noted on prior plain film examination. Aortic Atherosclerosis (ICD10-I70.0). Electronically Signed   By: Inez Catalina M.D.   On: 05/22/2020 05:21     LOS: 4 days   [place]  Lupita Leash, MD [facility]  05/23/2020, 7:29 AM

## 2020-05-23 NOTE — Progress Notes (Signed)
Patient has EJ on Lt. Side, but no blood returns since night shift. Consulted IV team and awaiting to response. Patient c/o getting stick for blood work, he requested to have central line for it. Explained patient that central line is not for that. Dr. Lupita Leash notified this matter and Dr. Lupita Leash is okay to do blood work during HD. HS [employer]

## 2020-05-23 NOTE — Progress Notes (Signed)
Subjective:  Had bronchoscopy-  Grossly seemed normal-  Also HD with 2 liters UF tolerated well -  Still coughing but says breathing is good     Objective Vital signs in last 24 hours: Vitals:   05/22/20 1845 05/22/20 1932 05/22/20 2300 05/23/20 0300  BP: (!) 143/55 (!) 169/52 (!) 130/34 (!) 139/41  Pulse: 85 89 89 84  Resp: '15 17 16 16  '$ Temp: 98.6 F (37 C) 98.4 F (36.9 C) 98.1 F (36.7 C) 98 F (36.7 C)  TempSrc: Oral Oral Oral Oral  SpO2: 100% 97% 97% 100%  Weight:    60.5 kg  Height:       Weight change: 0.6 kg  Intake/Output Summary (Last 24 hours) at 05/23/2020 0726 Last data filed at 05/23/2020 0400 Gross per 24 hour  Intake 200 ml  Output 2150 ml  Net -1950 ml   Dialyzes at [employer]-  mwf  - 3 hours and 30 minutes EDW 61.5. HD Bath unclear, Dialyzer 180, Heparin no. Access left AVG. sensipar 81 q HD, hectorol 2 q tx Last hgb 6.9 on 2/21  Assessment/Plan: 65 year old BM with ESRD and COPD-  Recent hosp for PNA remains SOB-  Very anemic and with pleural effusion 1 SOB-  Likely multifactorial.  Has history of COPD.  Just got over pneumonia so could have some residual from that or some deconditioning.  Now the added impact of being very anemic and also having a pleural effusion.  s/p thoracentesis.   Previous fever but not outrageous white blood count.  Given empiric antibiotics.  Will UF as able with HD. Pulmonary involved - s/p bronch which looked normal   2 ESRD: Normally Monday Wednesday Friday via AV graft.  Received full treatment on Monday,  Wednesday.  We will try to ultrafilter as able to help with pleural effusion. 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  However, anemia seems out of proportion to just ESRD.  Got partial transfusion Tuesday.    giving IV  iron and high dose ESA to assist as well.  Most certainly contributing to his feeling of shortness of breath- is improving   5. Metabolic Bone Disease: We will continue his home doses of Hectorol, Sensipar - hold phoslo for now as he does not seem to be eating much and phos is OK/low   Louis Meckel    Labs: Basic Metabolic Panel: Recent Labs  Lab 05/21/20 2226 05/22/20 1417 05/23/20 0109  NA 131* 132* 132*  K 3.3* 3.6 3.8  CL 93* 96* 97*  CO2 24 21* 24  GLUCOSE 273* 325* 303*  BUN 43* 51* 28*  CREATININE 5.70* 6.75* 3.82*  CALCIUM 8.2* 8.0* 7.8*  PHOS 2.0* 2.8 1.7*   Liver Function Tests: Recent Labs  Lab 05/19/20 0510 05/22/20 1417  AST 47*  --   ALT 29  --   ALKPHOS 121  --   BILITOT 0.9  --   PROT 6.8  --   ALBUMIN 2.7* 2.2*   No results for input(s): LIPASE, AMYLASE in the last 168 hours. No results for input(s): AMMONIA in the last 168 hours. CBC: Recent Labs  Lab 05/19/20 2201 05/20/20 0312 05/21/20 0120 05/21/20 2226 05/22/20 1417  WBC 7.7 8.6 8.1 7.6 7.8  NEUTROABS 6.3 7.2 6.6 6.1  --   HGB 7.8* 7.6* 8.2* 8.6* 8.0*  HCT 24.3* 23.2* 26.4* 28.3* 24.2*  MCV 91.7 90.6 92.6 95.0 90.0  PLT 212 219 198 223 204   Cardiac Enzymes: No results for input(s): CKTOTAL, CKMB, CKMBINDEX, TROPONINI in the last 168 hours. CBG: No results for input(s): GLUCAP in the last 168 hours.  Iron Studies:  No results for input(s): IRON, TIBC, TRANSFERRIN, FERRITIN in the last 72 hours. Studies/Results: CT CHEST WO CONTRAST  Result Date: 05/22/2020 CLINICAL DATA:  Follow-up pneumonia EXAM: CT CHEST WITHOUT CONTRAST TECHNIQUE: Multidetector CT imaging of the chest was performed following the standard protocol without IV contrast. COMPARISON:  Plain film from 05/20/2020 FINDINGS: Cardiovascular: Somewhat limited due to lack of IV contrast. Mild decreased attenuation of the cardiac blood pool is noted consistent with underlying anemia. Diffuse coronary and aortic  calcifications are noted. No aneurysmal dilatation is seen. Vascular stenting is noted in the right arm. Mediastinum/Nodes: Thoracic inlet is within normal limits. Scattered small hilar and mediastinal lymph nodes are seen likely reactive in nature. Lungs/Pleura: Bilateral pleural effusions are noted right considerably greater than left. Right lower lobe consolidation is noted consistent with acute pneumonia. Additionally changes of central vascular congestion and edema are noted similar to that seen on prior plain film examination. This may be related to a degree of volume overload. Upper Abdomen: Visualized upper abdomen shows no acute abnormality. Musculoskeletal: No acute bony abnormality is noted. Degenerative changes of the thoracic spine are seen. 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 IMPRESSION: Right lower lobe pneumonia. Bilateral pleural effusions and findings suggestive of CHF similar to that noted on prior plain film examination. Aortic Atherosclerosis (ICD10-I70.0). Electronically Signed   By: Inez Catalina M.D.   On: 05/22/2020 05:21   Medications: Infusions: . sodium chloride 250 mL (05/22/20 0847)  . ceFEPime (MAXIPIME) IV 1 g (05/22/20 2119)  . vancomycin      Scheduled Medications: . sodium chloride   Intravenous Once  . acidophilus  1 capsule Oral Daily  . atorvastatin  80 mg Oral Daily  . Chlorhexidine Gluconate Cloth  6 each Topical Q0600  . cinacalcet  60 mg Oral Q M,W,F  . darbepoetin (ARANESP) injection - DIALYSIS  200 mcg Intravenous Q Wed-HD  . doxercalciferol  2 mcg Intravenous Q M,W,F-HD  . famotidine  20 mg Oral Q M,W,F  . feeding supplement (NEPRO CARB STEADY)  237 mL Oral BID BM  . mometasone-formoterol  2 puff Inhalation BID  . multivitamin  1 tablet Oral QHS  . ticagrelor  90 mg Oral BID  . umeclidinium bromide  1 puff Inhalation Daily    have reviewed scheduled and prn medications.  Physical Exam: General: alert, pleasant, NAD-  occ  cough Heart: RRR Lungs:  better air movement Abdomen: soft, non tender Extremities: really no peripheral edema Dialysis Access: right AVF-  patent    05/23/2020,7:26 AM  LOS: 4 days

## 2020-05-23 NOTE — Plan of Care (Signed)

## 2020-05-24 DIAGNOSIS — J189 Pneumonia, unspecified organism: Secondary | ICD-10-CM | POA: Diagnosis not present

## 2020-05-24 DIAGNOSIS — A419 Sepsis, unspecified organism: Secondary | ICD-10-CM | POA: Diagnosis not present

## 2020-05-24 LAB — BASIC METABOLIC PANEL
Anion gap: 14 (ref 5–15)
BUN: 44 mg/dL — ABNORMAL HIGH (ref 8–23)
CO2: 22 mmol/L (ref 22–32)
Calcium: 8.3 mg/dL — ABNORMAL LOW (ref 8.9–10.3)
Chloride: 99 mmol/L (ref 98–111)
Creatinine, Ser: 5.5 mg/dL — ABNORMAL HIGH (ref 0.61–1.24)
GFR, Estimated: 11 mL/min — ABNORMAL LOW (ref >60)
Glucose, Bld: 246 mg/dL — ABNORMAL HIGH (ref 70–99)
Potassium: 3.5 mmol/L (ref 3.5–5.1)
Sodium: 135 mmol/L (ref 135–145)

## 2020-05-24 LAB — CULTURE, BLOOD (ROUTINE X 2)
Culture: NO GROWTH
Culture: NO GROWTH
Special Requests: ADEQUATE
Special Requests: ADEQUATE

## 2020-05-24 LAB — GLUCOSE, CAPILLARY
Glucose-Capillary: 191 mg/dL — ABNORMAL HIGH (ref 70–99)
Glucose-Capillary: 251 mg/dL — ABNORMAL HIGH (ref 70–99)
Glucose-Capillary: 287 mg/dL — ABNORMAL HIGH (ref 70–99)
Glucose-Capillary: 236 mg/dL — ABNORMAL HIGH (ref 70–99)

## 2020-05-24 LAB — CBC WITH DIFFERENTIAL/PLATELET
Abs Immature Granulocytes: 0.03 10*3/uL (ref 0.00–0.07)
Basophils Absolute: 0 10*3/uL (ref 0.0–0.1)
Basophils Relative: 1 %
Eosinophils Absolute: 0.1 10*3/uL (ref 0.0–0.5)
Eosinophils Relative: 2 %
HCT: 26.8 % — ABNORMAL LOW (ref 39.0–52.0)
Hemoglobin: 8.4 g/dL — ABNORMAL LOW (ref 13.0–17.0)
Immature Granulocytes: 0 %
Lymphocytes Relative: 11 %
Lymphs Abs: 0.8 10*3/uL (ref 0.7–4.0)
MCH: 29.7 pg (ref 26.0–34.0)
MCHC: 31.3 g/dL (ref 30.0–36.0)
MCV: 94.7 fL (ref 80.0–100.0)
Monocytes Absolute: 0.4 10*3/uL (ref 0.1–1.0)
Monocytes Relative: 5 %
Neutro Abs: 5.9 10*3/uL (ref 1.7–7.7)
Neutrophils Relative %: 81 %
Platelets: 210 10*3/uL (ref 150–400)
RBC: 2.83 MIL/uL — ABNORMAL LOW (ref 4.22–5.81)
RDW: 19.1 % — ABNORMAL HIGH (ref 11.5–15.5)
WBC: 7.2 10*3/uL (ref 4.0–10.5)
nRBC: 0.4 % — ABNORMAL HIGH (ref 0.0–0.2)

## 2020-05-24 LAB — CULTURE, RESPIRATORY W GRAM STAIN: Culture: NORMAL

## 2020-05-24 LAB — MAGNESIUM: Magnesium: 1.8 mg/dL (ref 1.7–2.4)

## 2020-05-24 LAB — PHOSPHORUS: Phosphorus: 1.6 mg/dL — ABNORMAL LOW (ref 2.5–4.6)

## 2020-05-24 MED ORDER — VANCOMYCIN HCL 750 MG/150ML IV SOLN
750.0000 mg | Freq: Once | INTRAVENOUS | Status: AC
  Administered 2020-05-24: 750 mg via INTRAVENOUS
  Filled 2020-05-24: qty 150

## 2020-05-24 MED ORDER — GLYBURIDE 2.5 MG PO TABS
2.5000 mg | ORAL_TABLET | Freq: Every day | ORAL | Status: DC
  Filled 2020-05-24: qty 1

## 2020-05-24 MED ORDER — GLIPIZIDE 5 MG PO TABS
2.5000 mg | ORAL_TABLET | Freq: Every day | ORAL | Status: DC
  Administered 2020-05-25 – 2020-05-26 (×2): 2.5 mg via ORAL
  Filled 2020-05-24 (×2): qty 0.5
  Filled 2020-05-24: qty 1
  Filled 2020-05-24: qty 0.5

## 2020-05-24 NOTE — Evaluation (Signed)
Clinical/Bedside Swallow Evaluation Patient Details  Name: Alexi Herrero MRN: [id number] Date of Birth: 12/29/1955  Today's Date: 05/24/2020 Time: SLP Start Time (ACUTE ONLY): 80 SLP Stop Time (ACUTE ONLY): 1722 SLP Time Calculation (min) (ACUTE ONLY): 16 min  Past Medical History:  Past Medical History:  Diagnosis Date  . CAD (coronary artery disease) 05/19/2020  . COPD (chronic obstructive pulmonary disease) ([place]) 05/19/2020  . ESRD (end stage renal disease) ([place]) 05/19/2020  . Herpes zoster ophthalmicus, right eye    Past Surgical History:  Past Surgical History:  Procedure Laterality Date  . ARTERIOVENOUS GRAFT PLACEMENT Right   . CORONARY ANGIOPLASTY    . IR THORACENTESIS ASP PLEURAL SPACE W/IMG GUIDE  05/20/2020   HPI:  Pt is a 64 y.o. male with medical history significant for ESRD on hemodialysis, COPD, coronary artery disease with stents, history of zoster ophthalmicus, recent hospital admission for pneumonia and symptomatic anemia, and supplemental oxygen requirement since the recent hospitalization. Pt presented to the ED for evaluation of chest pain, shortness of breath, and persistent cough.  CT chest 3/4: Right lower lobe pneumonia. SLP consulted due to concern for aspiration.   Assessment / Plan / Recommendation Clinical Impression  Pt was seen for bedside swallow evaluation. Pt reported that he intermittently gets a "tickle" in his throat during meals which causes coughing. Pt further stated that he was to have his esophagus dilated ~6 years prior, but this was not done due to his medical status as the time. He was unsure as to whether he sensed aspiration but indicated that some foods "wont go down" and pointed the pharynx as the site of the sensation. Oral mechanism exam was [facility] and pt was edentulous. Pt's oral phase was WNL despite edentulous status. He exhibited delayed coughing between trials of regular texture solids. Coughing was also noted at baseline and the possibility  of coughing being unrelated is considered. However, considering pt's reports, and the results of imaging, a modified barium swallow study is recommended to further assess physiology and to rule out aspiration. SLP Visit Diagnosis: Dysphagia, unspecified (R13.10)    Aspiration Risk  Mild aspiration risk    Diet Recommendation Regular;Thin liquid (continue current diet)   Liquid Administration via: Cup;Straw Medication Administration: Whole meds with liquid Compensations: Slow rate;Small sips/bites Postural Changes: Seated upright at 90 degrees    Other  Recommendations Oral Care Recommendations: Oral care BID   Follow up Recommendations  (TBD)      Frequency and Duration min 1 x/week  1 week       Prognosis Prognosis for Safe Diet Advancement: Good      Swallow Study   General Date of Onset: 05/23/20 HPI: Pt is a 65 y.o. male with medical history significant for ESRD on hemodialysis, COPD, coronary artery disease with stents, history of zoster ophthalmicus, recent hospital admission for pneumonia and symptomatic anemia, and supplemental oxygen requirement since the recent hospitalization. Pt presented to the ED for evaluation of chest pain, shortness of breath, and persistent cough.  CT chest 3/4: Right lower lobe pneumonia. SLP consulted due to concern for aspiration. Type of Study: Bedside Swallow Evaluation Previous Swallow Assessment: None Diet Prior to this Study: Regular;Thin liquids Temperature Spikes Noted: No Respiratory Status: Room air History of Recent Intubation: No Behavior/Cognition: Alert;Cooperative;Pleasant mood Oral Cavity Assessment: Within Functional Limits Oral Care Completed by SLP: No Oral Cavity - Dentition: Adequate natural dentition Vision: Functional for self-feeding Self-Feeding Abilities: Able to feed self;Needs assist Patient Positioning: Upright in bed;Postural control  adequate for testing Baseline Vocal Quality: Normal Volitional Cough:  Strong Volitional Swallow: Able to elicit    Oral/Motor/Sensory Function Overall Oral Motor/Sensory Function: Within functional limits   Ice Chips Ice chips: Within functional limits Presentation: Spoon   Thin Liquid Thin Liquid: Within functional limits Presentation: Straw    Nectar Thick Nectar Thick Liquid: Not tested   Honey Thick Honey Thick Liquid: Not tested   Puree Puree: Within functional limits Presentation: Spoon   Solid     Solid: Impaired Pharyngeal Phase Impairments: Cough - Immediate     Elijha Dedman I. Hardin Negus, [place], [place] Office number [phone] Pager [phone]  Horton Marshall 05/24/2020,5:31 PM

## 2020-05-24 NOTE — Progress Notes (Signed)
Pharmacy Antibiotic Note  Brandon Chaney is a 65 y.o. male admitted on 05/19/2020 with pneumonia.  PMH significant for ESRD on HD, COPD, CAD, recent hospitalization for pneumonia.  Patient transferred from [facility] center, where H&P states patient received Ceftriaxone, Azithromycin and Vancomycin.  Spoke with current RN who confirmed Vancomycin was not given at outside facility (medication was sent with patient but not yet administered).  Last HD noted on 05/18/20.  Pharmacy has been consulted for Vancomycin and Cefepime dosing.  Patient currently afebrile, wbc normal at 7.2. Does not appear that he received vancomycin after HD 3/4 will order supplemental dose today. Next HD planned on schedule for 3/7.   Plan: - Give Vancomycin 750 mg today then with HD - MWF  - MRSA PCR neg - f/u ability to d/c Vancomycin - Cont Cefepime at a dose of 1g/24h - Will continue to follow HD schedule/duration, culture results, LOT, and antibiotic de-escalation plans   Height: _0  (175.3 cm) Weight: 59.2 kg (130 lb 8.2 oz) IBW/kg (Calculated) : 70.7  Temp (24hrs), Avg:98.5 F (36.9 C), Min:97.4 F (36.3 C), Max:99 F (37.2 C)  Recent Labs  Lab 05/19/20 0510 05/19/20 0517 05/20/20 0312 05/21/20 0120 05/21/20 2226 05/22/20 1417 05/23/20 0109 05/24/20 0142  WBC 9.6   < > 8.6 8.1 7.6 7.8  --  7.2  CREATININE 4.06*   < > 5.75* 4.37* 5.70* 6.75* 3.82* 5.50*  LATICACIDVEN 1.4  --   --   --   --   --   --   --    < > = values in this interval not displayed.    Estimated Creatinine Clearance: 11.2 mL/min (A) (by C-G formula based on SCr of 5.5 mg/dL (H)).    Allergies  Allergen Reactions  . Duragesic-100 [Fentanyl] Nausea Only  . Sulfa Antibiotics     Heart flutters/itching    3/1 Cefepime >>   3/1 Vanc >>    3/1 Ucx: <10k insign growth 3/1 BCx x2 >> ng 3/1 RCx >> reincubate for better growth 3/1 MRSA PCR >> neg 3/4 BAL - ngtd  Thank you for allowing pharmacy to be a part of this  patient's care.  Erin Hearing PharmD., BCPS Clinical Pharmacist 05/24/2020 12:20 PM  **Pharmacist phone directory can now be found on [email or web address] ([email or web address]).  Listed under [place].

## 2020-05-24 NOTE — Plan of Care (Signed)

## 2020-05-24 NOTE — Progress Notes (Signed)
Subjective:  Sugar high and patient apparently refused insulin treatment - otherwise seems pretty stable - off O2   Objective Vital signs in last 24 hours: Vitals:   05/23/20 1900 05/23/20 2320 05/24/20 0342 05/24/20 0734  BP: (!) 146/47 (!) 129/52 (!) 139/45 (!) 131/59  Pulse: 88 89 85   Resp: '17 18 15 16  '$ Temp: 98.7 F (37.1 C) 99 F (37.2 C) 98.8 F (37.1 C)   TempSrc: Oral Oral Oral   SpO2: 95% 90% 99%   Weight:   59.2 kg   Height:       Weight change: -1.3 kg  Intake/Output Summary (Last 24 hours) at 05/24/2020 [id number] Last data filed at 05/23/2020 1949 Gross per 24 hour  Intake 1050 ml  Output --  Net 1050 ml   Dialyzes at [employer]-  mwf  - 3 hours and 30 minutes EDW 61.5. HD Bath unclear, Dialyzer 180, Heparin no. Access left AVG. sensipar 55 q HD, hectorol 2 q tx Last hgb 6.9 on 2/21  Assessment/Plan: 65 year old BM with ESRD and COPD-  Recent hosp for PNA remains SOB-  Very anemic and with pleural effusion 1 SOB-  Likely multifactorial.  Has history of COPD.  Just got over pneumonia so could have some residual from that or deconditioning.  Now the added impact of being very anemic and also having a pleural effusion.  s/p thoracentesis.   Previous fever but not outrageous white blood count.  Given empiric antibiotics.  Will UF as able with HD. Pulmonary involved - s/p bronch which looked normal   2 ESRD: Normally Monday Wednesday Friday via AV graft.  Received full treatment on Monday,  Wednesday.  We will try to ultrafilter as able to help with pleural effusion. 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  However, anemia seems out of proportion to just ESRD.  Got partial transfusion Tuesday.    giving IV iron and high dose ESA to assist as well.  Most certainly  contributing to his feeling of shortness of breath- is improving slowly 5. Metabolic Bone Disease: We will continue his home doses of Hectorol, Sensipar - hold phoslo for now as he does not seem to be eating much and phos is OK/low 6. DM-  Maybe would benefit from an oral agent ? He is agreeable- Per primary   [place]: Basic Metabolic Panel: Recent Labs  Lab 05/22/20 1417 05/23/20 0109 05/24/20 0142  NA 132* 132* 135  K 3.6 3.8 3.5  CL 96* 97* 99  CO2 21* 24 22  GLUCOSE 325* 303* 246*  BUN 51* 28* 44*  CREATININE 6.75* 3.82* 5.50*  CALCIUM 8.0* 7.8* 8.3*  PHOS 2.8 1.7* 1.6*   Liver Function Tests: Recent Labs  Lab 05/19/20 0510 05/22/20 1417  AST 47*  --   ALT 29  --   ALKPHOS 121  --   BILITOT 0.9  --   PROT 6.8  --   ALBUMIN 2.7* 2.2*   No results for input(s): LIPASE, AMYLASE in the last 168 hours. No results for input(s): AMMONIA in the last 168 hours. CBC: Recent Labs  Lab 05/20/20 0312 05/21/20 0120 05/21/20 2226 05/22/20 1417 05/24/20 0142  WBC 8.6 8.1 7.6 7.8 7.2  NEUTROABS 7.2 6.6 6.1  --  5.9  HGB 7.6* 8.2* 8.6* 8.0* 8.4*  HCT 23.2* 26.4* 28.3* 24.2* 26.8*  MCV 90.6 92.6 95.0 90.0 94.7  PLT 219 198 223 204 210   Cardiac Enzymes: No results for input(s): CKTOTAL, CKMB, CKMBINDEX, TROPONINI in the last 168 hours. CBG: Recent Labs  Lab 05/23/20 1601 05/23/20 2104 05/24/20 0637  GLUCAP 159* 290* 191*    Iron Studies:  No results for input(s): IRON, TIBC, TRANSFERRIN, FERRITIN in the last 72 hours. Studies/Results: No results found. Medications: Infusions: . sodium chloride 250 mL (05/22/20 0847)  . ceFEPime (MAXIPIME) IV 1 g (05/23/20 2308)  . vancomycin      Scheduled Medications: . sodium chloride   Intravenous Once  . acidophilus  1 capsule Oral Daily  . aspirin EC  81 mg Oral Daily  . atorvastatin  80 mg Oral Daily  . Chlorhexidine Gluconate Cloth  6 each Topical Q0600  . cinacalcet  60 mg Oral Q M,W,F   . darbepoetin (ARANESP) injection - DIALYSIS  200 mcg Intravenous Q Wed-HD  . doxercalciferol  2 mcg Intravenous Q M,W,F-HD  . famotidine  20 mg Oral Q M,W,F  . feeding supplement (NEPRO CARB STEADY)  237 mL Oral BID BM  . insulin aspart  0-9 Units Subcutaneous TID WC  . metoprolol succinate  12.5 mg Oral Daily  . mometasone-formoterol  2 puff Inhalation BID  . multivitamin  1 tablet Oral QHS  . ticagrelor  90 mg Oral BID  . umeclidinium bromide  1 puff Inhalation Daily    have reviewed scheduled and prn medications.  Physical Exam: General: alert, pleasant, NAD-  occ cough Heart: RRR Lungs:  better air movement Abdomen: soft, non tender Extremities: really no peripheral edema Dialysis Access: right AVF-  patent    05/24/2020,7:52 AM  LOS: 5 days

## 2020-05-24 NOTE — Progress Notes (Addendum)
PROGRESS NOTE   Brandon Chaney  FWY:[id number] DOB: Nov 17, 1955 DOA: 05/19/2020 PCP: Patient, No Pcp Per  Brief Narrative:  65 year old BM ESRD on HD-in [place]-proximal right upper extremity graft Dry weight is 61 kg COPD-mod CAD + stents 02/2020 EF dropped from 45% to 25% recently Herpes zoster ophthalmicus admitted from 1/16- 1/22 and 1/26 through 2/11: EF of 20 to 25% down from 45 to 50%, anemia and pneumonia.  High risk candidate per cardiology-medical management pursued-- was also seen by ID for Citrobacter koseri that grew on his culture after he underwent bronchoscopy.  He was treated with ceftriaxone and oral cefdinir. He also went home on O2. -P/C- ANCA negative, ANA negative dsDNA negative anti-CCP negative rheumatoid marginally positive less likely autoimmune and sent home on cefdinir 2/14 -Quantiferon negative ? 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 Bolused 500 cc IV fluid in ED--I/O today +627  Transferred to [facility] found to be febrile 102 CXR infiltrates White count 11 lactic acid 2 procalcitonin 1.4 CRP 49 INR 1.3 transferred to [facility] long after receiving LR ASA Rocephin vancomycin Tylenol  On admission to [facility] showed BUN/creatinine 33/4.1 Potassium 3.1 Iron 34 saturation was 21 ferritin above 7500 Procalcitonin 23  Since admit 3/1admission- transferred from [facility] to [facility] for maintenance HD,  1.3 L fluid removal ongoing hemoptysis therefore pulmonary was consulted see below  Hospital-Problem based course  Dyspnea from a combination of volume overload-high-output heart failure from anemia Right-sided pneumonia Recent Citrobacter and given 1 month of antibiotics projected end date at the time of that hospitalization was 05/04/2020 Continue  cefepime vancomycin (procalcitonin 23) but do not give fluids given effusion at this time CT chest 3/4 confirms right-sided pneumonia--May need speech to see him Follow BAL culture from 3/4  See below Will ask ID to non -emergently evaluate for duration of Abx etc. Pleural effusion 1 view IR thoracentesis performed 3/2-with marked improvement Lowering  EDW per nephrology will help keep dry Hemoptysis Underwent bronchoscopy at [place] [facility] BAL culture = Citrobacter negative for malignancy, negative for autoimmune disease-etiology felt to be?  DOAC Repeat BAL Acute anemia likely combination of renal disease, unclear blood loss  Hemoccult stool Given 1/2 units of blood on 3/1 Management as per nephrology with iron, iron ESRD MWF right upper extremity [place] Dry weight 61,  Defer to nephrology rest CAD status post stent 02/2020 drop in EF from 45 to 25% Medical treatment is very high risk for bleeding for left heart cath- Here resuming aspirin Brilinta and Lipitor and cut back Toprol to 12.5 Hold lisinopril Chronic pain Home meds Percocet 1 tab every 4 as needed Uncontrolled DM TY 2  Start glyburide low-dose and monitor trends COPD Herpes ophthalmicus  DVT prophylaxis: scd Code Status: FULL Family Communication: wife fully updated at the bedside-phone #[phone] Disposition:  Status is: Inpatient  Remains inpatient appropriate because:Hemodynamically unstable, Altered mental status, Ongoing diagnostic testing needed not appropriate for outpatient work up, Unsafe d/c plan, IV treatments appropriate due to intensity of illness or inability to take PO and Inpatient level of care appropriate due to severity of illness   Dispo: The patient is from: Home              Anticipated d/c is to: tbd  Patient currently is not medically stable to d/c.   Difficult to place patient No  Consultants:   Nephrology   IR  Cardiology  Procedures: IR  thoracentesis  Bronchoalveolar lavage on 3/4 Impression:            - Hemoptysis with abnormal CXR                        - The airway examination was normal.                        - Bronchoalveolar lavage was performed.                        - The airway examination was normal. Antimicrobials: Vanc Cefepime    Subjective:  Looks well feels well no chest pain no fever no chills-tells me that he occasionally chokes when he eats but I am not able to corroborate any consistencies causing this Walking around ambulatory not on oxygen  Objective: Vitals:   05/23/20 2320 05/24/20 0342 05/24/20 0734 05/24/20 1123  BP: (!) 129/52 (!) 139/45 (!) 131/59 (!) 141/48  Pulse: 89 85    Resp: _0 Temp: 99 F (37.2 C) 98.8 F (37.1 C)  (!) 97.4 F (36.3 C)  TempSrc: Oral Oral  Axillary  SpO2: 90% 99% 94%   Weight:  59.2 kg    Height:        Intake/Output Summary (Last 24 hours) at 05/24/2020 1514 Last data filed at 05/24/2020 1045 Gross per 24 hour  Intake 570 ml  Output 125 ml  Net 445 ml   Filed Weights   05/22/20 0429 05/23/20 0300 05/24/20 0342  Weight: 59.9 kg 60.5 kg 59.2 kg    Examination:  EOMI NCAT no focal deficit looks well Chest clear no added sound no rales no rhonchi Abdomen soft nontender no rebound no guarding Neurologically intact No focal deficit Moving all four limbs equally Neck soft supple  Data Reviewed: personally reviewed   CBC    Component Value Date/Time   WBC 7.2 05/24/2020 0142   RBC 2.83 (L) 05/24/2020 0142   HGB 8.4 (L) 05/24/2020 0142   HCT 26.8 (L) 05/24/2020 0142   PLT 210 05/24/2020 0142   MCV 94.7 05/24/2020 0142   MCH 29.7 05/24/2020 0142   MCHC 31.3 05/24/2020 0142   RDW 19.1 (H) 05/24/2020 0142   LYMPHSABS 0.8 05/24/2020 0142   MONOABS 0.4 05/24/2020 0142   EOSABS 0.1 05/24/2020 0142   BASOSABS 0.0 05/24/2020 0142   CMP Latest Ref Rng & Units 05/24/2020 05/23/2020 05/22/2020  Glucose 70 - 99 mg/dL 246(H) 303(H) 325(H)  BUN  8 - 23 mg/dL 44(H) 28(H) 51(H)  Creatinine 0.61 - 1.24 mg/dL 5.50(H) 3.82(H) 6.75(H)  Sodium 135 - 145 mmol/L 135 132(L) 132(L)  Potassium 3.5 - 5.1 mmol/L 3.5 3.8 3.6  Chloride 98 - 111 mmol/L 99 97(L) 96(L)  CO2 22 - 32 mmol/L 22 24 21(L)  Calcium 8.9 - 10.3 mg/dL 8.3(L) 7.8(L) 8.0(L)  Total Protein 6.5 - 8.1 g/dL - - -  Total Bilirubin 0.3 - 1.2 mg/dL - - -  Alkaline Phos 38 - 126 U/L - - -  AST 15 - 41 U/L - - -  ALT 0 - 44 U/L - - -     Radiology Studies: No results found.   Scheduled Meds: . sodium chloride   Intravenous Once  .  acidophilus  1 capsule Oral Daily  . aspirin EC  81 mg Oral Daily  . atorvastatin  80 mg Oral Daily  . Chlorhexidine Gluconate Cloth  6 each Topical Q0600  . cinacalcet  60 mg Oral Q M,W,F  . darbepoetin (ARANESP) injection - DIALYSIS  200 mcg Intravenous Q Wed-HD  . doxercalciferol  2 mcg Intravenous Q M,W,F-HD  . famotidine  20 mg Oral Q M,W,F  . feeding supplement (NEPRO CARB STEADY)  237 mL Oral BID BM  . insulin aspart  0-9 Units Subcutaneous TID WC  . metoprolol succinate  12.5 mg Oral Daily  . mometasone-formoterol  2 puff Inhalation BID  . multivitamin  1 tablet Oral QHS  . ticagrelor  90 mg Oral BID  . umeclidinium bromide  1 puff Inhalation Daily   Continuous Infusions: . sodium chloride 250 mL (05/22/20 0847)  . ceFEPime (MAXIPIME) IV 1 g (05/23/20 2308)  . vancomycin    . vancomycin       LOS: 5 days   Time spent: 95 minutes 60 in direct care time and care coordination  Nita Sells, MD [facility] To contact the attending provider between 7A-7P or the covering provider during after hours 7P-7A, please log into the web site [email or web address] and access using universal [place] password for that web site. If you do not have the password, please call the hospital operator.  05/24/2020, 3:14 PM

## 2020-05-25 ENCOUNTER — Inpatient Hospital Stay (HOSPITAL_COMMUNITY): Payer: Medicare Other

## 2020-05-25 ENCOUNTER — Encounter (HOSPITAL_COMMUNITY): Payer: Self-pay | Admitting: Internal Medicine

## 2020-05-25 DIAGNOSIS — J189 Pneumonia, unspecified organism: Secondary | ICD-10-CM | POA: Diagnosis not present

## 2020-05-25 DIAGNOSIS — A419 Sepsis, unspecified organism: Secondary | ICD-10-CM | POA: Diagnosis not present

## 2020-05-25 LAB — CBC WITH DIFFERENTIAL/PLATELET
Abs Immature Granulocytes: 0.04 10*3/uL (ref 0.00–0.07)
Basophils Absolute: 0.1 10*3/uL (ref 0.0–0.1)
Basophils Relative: 1 %
Eosinophils Absolute: 0.2 10*3/uL (ref 0.0–0.5)
Eosinophils Relative: 2 %
HCT: 25.5 % — ABNORMAL LOW (ref 39.0–52.0)
Hemoglobin: 7.9 g/dL — ABNORMAL LOW (ref 13.0–17.0)
Immature Granulocytes: 1 %
Lymphocytes Relative: 12 %
Lymphs Abs: 1.1 10*3/uL (ref 0.7–4.0)
MCH: 29.5 pg (ref 26.0–34.0)
MCHC: 31 g/dL (ref 30.0–36.0)
MCV: 95.1 fL (ref 80.0–100.0)
Monocytes Absolute: 0.4 10*3/uL (ref 0.1–1.0)
Monocytes Relative: 5 %
Neutro Abs: 7.1 10*3/uL (ref 1.7–7.7)
Neutrophils Relative %: 79 %
Platelets: 202 10*3/uL (ref 150–400)
RBC: 2.68 MIL/uL — ABNORMAL LOW (ref 4.22–5.81)
RDW: 19.9 % — ABNORMAL HIGH (ref 11.5–15.5)
WBC: 8.9 10*3/uL (ref 4.0–10.5)
nRBC: 0 % (ref 0.0–0.2)

## 2020-05-25 LAB — GLUCOSE, CAPILLARY
Glucose-Capillary: 183 mg/dL — ABNORMAL HIGH (ref 70–99)
Glucose-Capillary: 204 mg/dL — ABNORMAL HIGH (ref 70–99)
Glucose-Capillary: 105 mg/dL — ABNORMAL HIGH (ref 70–99)
Glucose-Capillary: 263 mg/dL — ABNORMAL HIGH (ref 70–99)

## 2020-05-25 LAB — COMPREHENSIVE METABOLIC PANEL
ALT: 26 U/L (ref 0–44)
AST: 25 U/L (ref 15–41)
Albumin: 2.3 g/dL — ABNORMAL LOW (ref 3.5–5.0)
Alkaline Phosphatase: 107 U/L (ref 38–126)
Anion gap: 15 (ref 5–15)
BUN: 59 mg/dL — ABNORMAL HIGH (ref 8–23)
CO2: 20 mmol/L — ABNORMAL LOW (ref 22–32)
Calcium: 8.6 mg/dL — ABNORMAL LOW (ref 8.9–10.3)
Chloride: 96 mmol/L — ABNORMAL LOW (ref 98–111)
Creatinine, Ser: 6.88 mg/dL — ABNORMAL HIGH (ref 0.61–1.24)
GFR, Estimated: 8 mL/min — ABNORMAL LOW (ref >60)
Glucose, Bld: 242 mg/dL — ABNORMAL HIGH (ref 70–99)
Potassium: 3.6 mmol/L (ref 3.5–5.1)
Sodium: 131 mmol/L — ABNORMAL LOW (ref 135–145)
Total Bilirubin: 1.4 mg/dL — ABNORMAL HIGH (ref 0.3–1.2)
Total Protein: 6.9 g/dL (ref 6.5–8.1)

## 2020-05-25 LAB — CYTOLOGY - NON PAP

## 2020-05-25 LAB — HEMOGLOBIN A1C
Hgb A1c MFr Bld: 5.6 % (ref 4.8–5.6)
Mean Plasma Glucose: 114.02 mg/dL

## 2020-05-25 LAB — C-REACTIVE PROTEIN: CRP: 5 mg/dL — ABNORMAL HIGH (ref <1.0)

## 2020-05-25 LAB — SEDIMENTATION RATE: Sed Rate: 110 mm/hr — ABNORMAL HIGH (ref 0–16)

## 2020-05-25 LAB — PROCALCITONIN: Procalcitonin: 5.95 ng/mL

## 2020-05-25 MED ORDER — INSULIN ASPART 100 UNIT/ML ~~LOC~~ SOLN: 3.0000 [IU] | Freq: Three times a day (TID) | SUBCUTANEOUS | Status: DC

## 2020-05-25 MED ORDER — DOXERCALCIFEROL 4 MCG/2ML IV SOLN
INTRAVENOUS | Status: AC
  Filled 2020-05-25: qty 2

## 2020-05-25 MED ORDER — METHYLPREDNISOLONE SODIUM SUCC 125 MG IJ SOLR
60.0000 mg | INTRAMUSCULAR | Status: DC
  Administered 2020-05-25 – 2020-05-26 (×2): 60 mg via INTRAVENOUS
  Filled 2020-05-25 (×2): qty 2

## 2020-05-25 MED ORDER — INSULIN ASPART 100 UNIT/ML ~~LOC~~ SOLN
0.0000 [IU] | Freq: Three times a day (TID) | SUBCUTANEOUS | Status: DC
  Administered 2020-05-27: 2 [IU] via SUBCUTANEOUS

## 2020-05-25 NOTE — Progress Notes (Signed)
Subjective:  Pt seen in room, has no new c/o's. occ coughing but overall much better   Objective Vital signs in last 24 hours: Vitals:   05/25/20 0406 05/25/20 0500 05/25/20 0805 05/25/20 1113  BP: (!) 142/42  (!) 127/53 (!) 130/44  Pulse: 85  86 85  Resp: 15  19 (!) 22  Temp: 98 F (36.7 C)  97.8 F (36.6 C) 98.3 F (36.8 C)  TempSrc: Oral  Oral Oral  SpO2: 99%  92% 94%  Weight:  59.1 kg    Height:       Weight change: -0.1 kg  Intake/Output Summary (Last 24 hours) at 05/25/2020 1206 Last data filed at 05/24/2020 1700 Gross per 24 hour  Intake 370 ml  Output --  Net 370 ml   OP HD: [facility] [place] MWF  3.5h  61.5kg  Hep none  LUA AVG  sensipar 60 q HD  hectorol 2 q tx  Last hgb 6.9 on 2/21  Assessment/Plan: 65 year old BM with ESRD and COPD-  Recent hosp for PNA did not recover -  Very anemic and with pleural effusion 1 SOB: Likely multifactorial.  Has history of COPD. Has PNA w/ 2nd round of abx here, improving. UF as able with HD. Pulmonary involved - s/p bronch which looked normal. Improving.   2 ESRD: MWF HD.  Next HD today.  3 Hypertension:  holding any antihypertensives to give [place] room for ultrafiltration 4 Anemia ckd: He definitely had this.  However, anemia seems out of proportion to just ESRD.  Got partial transfusion Tuesday.  Giving IV iron and high dose ESA to assist as well. 5 Metabolic Bone Disease: We will continue his home doses of Hectorol, Sensipar - hold phoslo for now as he does not seem to be eating much and phos is OK/low 6 DM-  Per primary  Kelly Splinter, MD 05/25/2020, 12:10 PM    Labs: Basic Metabolic Panel: Recent Labs  Lab 05/22/20 1417 05/23/20 0109 05/24/20 0142 05/25/20 0048  NA 132* 132* 135 131*  K 3.6 3.8 3.5 3.6  CL 96* 97* 99 96*  CO2 21* 24 22 20*  GLUCOSE 325* 303* 246* 242*  BUN 51* 28* 44* 59*  CREATININE 6.75* 3.82* 5.50* 6.88*  CALCIUM 8.0* 7.8* 8.3* 8.6*  PHOS 2.8 1.7* 1.6*  --    Liver Function Tests: Recent  Labs  Lab 05/19/20 0510 05/22/20 1417 05/25/20 0048  AST 47*  --  25  ALT 29  --  26  ALKPHOS 121  --  107  BILITOT 0.9  --  1.4*  PROT 6.8  --  6.9  ALBUMIN 2.7* 2.2* 2.3*   No results for input(s): LIPASE, AMYLASE in the last 168 hours. No results for input(s): AMMONIA in the last 168 hours. CBC: Recent Labs  Lab 05/21/20 0120 05/21/20 2226 05/22/20 1417 05/24/20 0142 05/25/20 0048  WBC 8.1 7.6 7.8 7.2 8.9  NEUTROABS 6.6 6.1  --  5.9 7.1  HGB 8.2* 8.6* 8.0* 8.4* 7.9*  HCT 26.4* 28.3* 24.2* 26.8* 25.5*  MCV 92.6 95.0 90.0 94.7 95.1  PLT 198 223 204 210 202   Cardiac Enzymes: No results for input(s): CKTOTAL, CKMB, CKMBINDEX, TROPONINI in the last 168 hours. CBG: Recent Labs  Lab 05/24/20 1125 05/24/20 1630 05/24/20 2114 05/25/20 0630 05/25/20 1115  GLUCAP 251* 287* 236* 183* 204*    Iron Studies:  No results for input(s): IRON, TIBC, TRANSFERRIN, FERRITIN in the last 72 hours. Studies/Results: DG Swallowing Func-Speech  Pathology  Result Date: 05/25/2020 Objective Swallowing Evaluation: Type of Study: MBS-Modified Barium Swallow Study  Patient Details Name: Brandon Chaney MRN: [id number] Date of Birth: [birth year] Today's Date: 05/25/2020 Time: SLP Start Time (ACUTE ONLY): 0859 -SLP Stop Time (ACUTE ONLY): 0911 SLP Time Calculation (min) (ACUTE ONLY): 12 min Past Medical History: Past Medical History: Diagnosis Date . CAD (coronary artery disease) 05/19/2020 . COPD (chronic obstructive pulmonary disease) ([place]) 05/19/2020 . ESRD (end stage renal disease) ([place]) 05/19/2020 . Herpes zoster ophthalmicus, right eye  Past Surgical History: Past Surgical History: Procedure Laterality Date . ARTERIOVENOUS GRAFT PLACEMENT Right  . CORONARY ANGIOPLASTY   . IR THORACENTESIS ASP PLEURAL SPACE W/IMG GUIDE  05/20/2020 HPI: Pt is a 65 y.o. male with medical history significant for ESRD on hemodialysis, COPD, coronary artery disease with stents, history of zoster ophthalmicus, recent hospital  admission for pneumonia and symptomatic anemia, and supplemental oxygen requirement since the recent hospitalization. Pt presented to the ED for evaluation of chest pain, shortness of breath, and persistent cough.  CT chest 3/4: Right lower lobe pneumonia. SLP consulted due to concern for aspiration.  Subjective: Pt pleasant and cooperative Assessment / Plan / Recommendation CHL IP CLINICAL IMPRESSIONS 05/25/2020 Clinical Impression Pt presents with an oropharyngeal swallow that is [facility]. One instance of penetration (PAS 2) was noted due to timing in which the bolus spilled under his epiglottis; however, the penetrates were cleared as the pt swallowed. Although penetration occured, his swallow still is efficient and safe with appropriate airway protection of all consistencies. Pt's subjective symptoms appear more esophageal so recomend follow up from GI if symptoms continue to persist. Provided education such as taking smaller sips and bites as well as sitting up right following PO intake to help with these subjective symptoms. Recomend that pt continuation a regular diet and thin liquids. SLP will sign off at this time. SLP Visit Diagnosis Dysphagia, unspecified (R13.10) Attention and concentration deficit following -- Frontal lobe and executive function deficit following -- Impact on safety and function Mild aspiration risk   CHL IP TREATMENT RECOMMENDATION 05/25/2020 Treatment Recommendations No treatment recommended at this time   Prognosis 05/25/2020 Prognosis for Safe Diet Advancement Good Barriers to Reach Goals -- Barriers/Prognosis Comment -- CHL IP DIET RECOMMENDATION 05/25/2020 SLP Diet Recommendations Regular solids;Thin liquid Liquid Administration via Cup;Straw Medication Administration Whole meds with liquid Compensations Slow rate;Small sips/bites Postural Changes Remain semi-upright after after feeds/meals (Comment);Seated upright at 90 degrees   CHL IP OTHER RECOMMENDATIONS 05/25/2020 Recommended Consults  Consider GI evaluation Oral Care Recommendations Oral care BID Other Recommendations --   CHL IP FOLLOW UP RECOMMENDATIONS 05/25/2020 Follow up Recommendations None   CHL IP FREQUENCY AND DURATION 05/24/2020 Speech Therapy Frequency (ACUTE ONLY) min 1 x/week Treatment Duration 1 week      CHL IP ORAL PHASE 05/25/2020 Oral Phase WFL Oral - Pudding Teaspoon -- Oral - Pudding Cup -- Oral - Honey Teaspoon -- Oral - Honey Cup -- Oral - Nectar Teaspoon -- Oral - Nectar Cup -- Oral - Nectar Straw -- Oral - Thin Teaspoon -- Oral - Thin Cup -- Oral - Thin Straw -- Oral - Puree -- Oral - Mech Soft -- Oral - Regular -- Oral - Multi-Consistency -- Oral - Pill -- Oral Phase - Comment --  CHL IP PHARYNGEAL PHASE 05/25/2020 Pharyngeal Phase Impaired Pharyngeal- Pudding Teaspoon -- Pharyngeal -- Pharyngeal- Pudding Cup -- Pharyngeal -- Pharyngeal- Honey Teaspoon -- Pharyngeal -- Pharyngeal- Honey Cup -- Pharyngeal -- Pharyngeal- Nectar Teaspoon -- Pharyngeal --  Pharyngeal- Nectar Cup -- Pharyngeal -- Pharyngeal- Nectar Straw -- Pharyngeal -- Pharyngeal- Thin Teaspoon -- Pharyngeal -- Pharyngeal- Thin Cup WFL Pharyngeal -- Pharyngeal- Thin Straw Penetration/Aspiration before swallow Pharyngeal Material enters airway, remains ABOVE vocal cords then ejected out Pharyngeal- Puree WFL Pharyngeal -- Pharyngeal- Mechanical Soft -- Pharyngeal -- Pharyngeal- Regular WFL Pharyngeal -- Pharyngeal- Multi-consistency -- Pharyngeal -- Pharyngeal- Pill WFL Pharyngeal -- Pharyngeal Comment --  CHL IP CERVICAL ESOPHAGEAL PHASE 05/25/2020 Cervical Esophageal Phase WFL Pudding Teaspoon -- Pudding Cup -- Honey Teaspoon -- Honey Cup -- Nectar Teaspoon -- Nectar Cup -- Nectar Straw -- Thin Teaspoon -- Thin Cup -- Thin Straw -- Puree -- Mechanical Soft -- Regular -- Multi-consistency -- Pill -- Cervical Esophageal Comment -- Note populated for Lebron Conners, Student SLP Osie Bond., M.A. [place] [facility] Pager [phone] Office [phone]  05/25/2020, 10:28 AM              Medications: Infusions: . sodium chloride 250 mL (05/22/20 0847)  . ceFEPime (MAXIPIME) IV 1 g (05/24/20 2127)  . vancomycin      Scheduled Medications: . sodium chloride   Intravenous Once  . acidophilus  1 capsule Oral Daily  . aspirin EC  81 mg Oral Daily  . atorvastatin  80 mg Oral Daily  . Chlorhexidine Gluconate Cloth  6 each Topical Q0600  . cinacalcet  60 mg Oral Q M,W,F  . darbepoetin (ARANESP) injection - DIALYSIS  200 mcg Intravenous Q Wed-HD  . doxercalciferol  2 mcg Intravenous Q M,W,F-HD  . famotidine  20 mg Oral Q M,W,F  . feeding supplement (NEPRO CARB STEADY)  237 mL Oral BID BM  . glipiZIDE  2.5 mg Oral QAC breakfast  . insulin aspart  0-9 Units Subcutaneous TID WC  . metoprolol succinate  12.5 mg Oral Daily  . mometasone-formoterol  2 puff Inhalation BID  . multivitamin  1 tablet Oral QHS  . ticagrelor  90 mg Oral BID  . umeclidinium bromide  1 puff Inhalation Daily    have reviewed scheduled and prn medications.  Physical Exam: General: alert, pleasant, NAD-  occ cough Heart: RRR Lungs:  better air movement Abdomen: soft, non tender Extremities: really no peripheral edema Dialysis Access: right AVF-  patent    05/25/2020,12:06 PM  LOS: 6 days

## 2020-05-25 NOTE — Consult Note (Addendum)
NAME:  Brandon Chaney, MRN:  [id number], DOB:  05/26/1955, LOS: 6 ADMISSION DATE:  05/19/2020, CONSULTATION DATE:  05/21/20 REFERRING MD:  Maren Beach - TRH, CHIEF COMPLAINT:  Hemoptysis   Brief History:  65 yo persistent small volume hemoptysis since December 2021. Repeatedly tx for PNA -- recent admit to [facility] for citrobacter koseri   History of Present Illness:  65 yo M PMH HFrEF, ESRD, COPD, CAD, anemia who presented to [facility] ED 3/122 with chest pain SOB cough and hemoptysis. Recent hospitalization for PNA (citrobacater koseri) and symptomatic anemia requiring transfusion and was discharged on O2. Symptoms have not improved since discharge, and pt endorses small volume hemoptysis since December 2021.  In ED, CXR revealed bilateral upper lobe opacities, pt started on rocephin and vanc. Admitted to [facility] to [facility]. Transferred to [facility] due to iHD need. Underwent Thora with IR 3/2 with 1.3L removed.  PCCM consulted 3/3 for hemoptysis evaluation   Past Medical History:  HFrEF  ESRD COPD CAD Zoster Ophthalmicus  PNA Anemia  Rheumatoid Arthritis  Significant Hospital Events:  3/1 admitted to [facility] 3/2 transfer to [facility] [place] with IR 3/3 CCM consult for persistent hemoptysis   Consults:  IR CCM  Procedures:  3/2 R Thora> 1.3L off   Significant Diagnostic Tests:  3/1 NM pulm perfusion> small nonsegmetal defects, felt to be r/t PNA not PE 3/2 CXR> Bilateral infiltrates and edema.   Micro Data:  3/1 sputum> rare WBC, few squamous cells, no orgs  3/1 covid negative, flu a/b negative   Antimicrobials:  Vanc  Cefepime  Interim History / Subjective:  No acute events overnight. Patient is currently on room air and has been ambulating. He continues to have cough with speaking a lot. Continues to cough up blood tinged sputum.  Objective   Blood pressure (!) 130/44, pulse 85, temperature 98.3 F (36.8 C), temperature source Oral, resp. rate (!) 22, height 5' 9" (1.753  m), weight 59.1 kg, SpO2 94 %.        Intake/Output Summary (Last 24 hours) at 05/25/2020 1135 Last data filed at 05/24/2020 1700 Gross per 24 hour  Intake 490 ml  Output --  Net 490 ml   Filed Weights   05/23/20 0300 05/24/20 0342 05/25/20 0500  Weight: 60.5 kg 59.2 kg 59.1 kg    Examination: General: elderly male, no acute distress HENT: [place]/AT, PERRL, sclera anicteric, moist mucous membranes Lungs: no wheezing or rhonchi. Diminished breath sounds at bases. Cardiovascular: rrr cap refill < 3 sec  Abdomen: soft  thin + bowel sounds  Extremities: L AVG. No acute deformity. Lower extremity s/p metatarsal amputation. No peripheral edema Neuro: AAOx4 following commands  Resolved Hospital Problem list     Assessment & Plan:    Acute hypoxemic respiratory failure  Hemoptysis with hemosiderin laden macrophages on BAL Recent multifocal pna  COPD ESRD  CAD s/p PCI  HFrEF   Patient's respiratory failure appears to be improving as he is off supplemental oxygen at this time. The BAL on the bronchoscopy has hemosiderin laden macrophages concerning for pulmonary capillaritis or diffuse alveolar hemorrhage which is in the setting of recent pneumonia and dual antiplatelet therapy for her coronary stents. I will check an ESR, CRP and procalcitonin today and consider starting a course of steroids to help with any lung inflammation that remains.  He is being treated with vancomycin and cefepime for pneumonia, today is day 8 of antibiotics. MRSA screen is negative. I think ok to  antibiotics at this time but infectious disease is being consulted for further determination. ° °The right pleural effusion appears transudative in nature based on total protein ratio. Likely in setting of heart failure and ESRD he has bilateral effusions. ° °Continue incruse and dulera for his COPD.  ° °Best practice (evaluated daily)  °Diet: NPO at Midlnght  ° ° °Goals of Care:  ° °Code Status: Full ° °Labs    °CBC: °Recent Labs  °Lab 05/20/20 °0312 05/21/20 °0120 05/21/20 °2226 05/22/20 °1417 05/24/20 °0142 05/25/20 °0048  °WBC 8.6 8.1 7.6 7.8 7.2 8.9  °NEUTROABS 7.2 6.6 6.1  --  5.9 7.1  °HGB 7.6* 8.2* 8.6* 8.0* 8.4* 7.9*  °HCT 23.2* 26.4* 28.3* 24.2* 26.8* 25.5*  °MCV 90.6 92.6 95.0 90.0 94.7 95.1  °PLT 219 198 223 204 210 202  ° ° °Basic Metabolic Panel: °Recent Labs  °Lab 05/20/20 °0312 05/21/20 °0120 05/21/20 °2226 05/22/20 °1417 05/23/20 °0109 05/24/20 °0142 05/25/20 °0048  °NA 135 136 131* 132* 132* 135 131*  °K 3.3* 3.2* 3.3* 3.6 3.8 3.5 3.6  °CL 95* 99 93* 96* 97* 99 96*  °CO2 24 24 24 21* 24 22 20*  °GLUCOSE 170* 192* 273* 325* 303* 246* 242*  °BUN 51* 28* 43* 51* 28* 44* 59*  °CREATININE 5.75* 4.37* 5.70* 6.75* 3.82* 5.50* 6.88*  °CALCIUM 7.5* 8.0* 8.2* 8.0* 7.8* 8.3* 8.6*  °MG 2.0 1.9 2.0  --  1.8 1.8  --   °PHOS 4.2 2.7 2.0* 2.8 1.7* 1.6*  --   ° °GFR: °Estimated Creatinine Clearance: 8.9 mL/min (A) (by C-G formula based on SCr of 6.88 mg/dL (H)). °Recent Labs  °Lab 05/19/20 °0510 05/19/20 °0517 05/19/20 °2201 05/20/20 °0312 05/21/20 °0120 05/21/20 °2226 05/22/20 °1417 05/24/20 °0142 05/25/20 °0048  °PROCALCITON 23.01 23.33  --  38.64  --   --   --   --   --   °WBC 9.6  --    < > 8.6   < > 7.6 7.8 7.2 8.9  °LATICACIDVEN 1.4  --   --   --   --   --   --   --   --   ° < > = values in this interval not displayed.  ° ° °Liver Function Tests: °Recent Labs  °Lab 05/19/20 °0510 05/22/20 °1417 05/25/20 °0048  °AST 47*  --  25  °ALT 29  --  26  °ALKPHOS 121  --  107  °BILITOT 0.9  --  1.4*  °PROT 6.8  --  6.9  °ALBUMIN 2.7* 2.2* 2.3*  ° °No results for input(s): LIPASE, AMYLASE in the last 168 hours. °No results for input(s): AMMONIA in the last 168 hours. ° °ABG °No results found for: PHART, PCO2ART, PO2ART, HCO3, TCO2, ACIDBASEDEF, O2SAT  ° °Coagulation Profile: °No results for input(s): INR, PROTIME in the last 168 hours. ° °Cardiac Enzymes: °No results for input(s): CKTOTAL, CKMB, CKMBINDEX, TROPONINI in the  last 168 hours. ° °HbA1C: °No results found for: HGBA1C ° °CBG: °Recent Labs  °Lab 05/24/20 °1125 05/24/20 °1630 05/24/20 °2114 05/25/20 °0630 05/25/20 °1115  °GLUCAP 251* 287* 236* 183* 204*  ° ° °Jonathan Dewald, MD °[facility] °Office: [phone] ° ° °See Amion for Pager Details  ° ° ° °

## 2020-05-25 NOTE — Progress Notes (Signed)
PROGRESS NOTE   Brandon Chaney  SEG:[id number] DOB: February 09, 1956 DOA: 05/19/2020 PCP: Patient, No Pcp Per  Brief Narrative:  65 year old BM ESRD on HD-in [place]-proximal right upper extremity graft Dry weight is 61 kg COPD-mod CAD + stents 02/2020 EF dropped from 45% to 25% recently Herpes zoster ophthalmicus admitted from 1/16- 1/22 and 1/26 through 2/11: EF of 20 to 25% down from 45 to 50%, anemia and pneumonia.  High risk candidate per cardiology-medical management pursued-- was also seen by ID for Citrobacter koseri that grew on his culture after he underwent bronchoscopy.  He was treated with ceftriaxone and oral cefdinir. He also went home on O2. -P/C- ANCA negative, ANA negative dsDNA negative anti-CCP negative rheumatoid marginally positive less likely autoimmune and sent home on cefdinir 2/14 -Quantiferon negative ? 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 Bolused 500 cc IV fluid in ED--I/O today +627  Transferred to [facility] found to be febrile 102 CXR infiltrates White count 11 lactic acid 2 procalcitonin 1.4 CRP 49 INR 1.3 transferred to [facility] long after receiving LR ASA Rocephin vancomycin Tylenol  On admission to [facility] showed BUN/creatinine 33/4.1 Potassium 3.1 Iron 34 saturation was 21 ferritin above 7500 Procalcitonin 23  Since admit 3/1admission- transferred from [facility] to [facility] for maintenance HD,  1.3 L fluid removal ongoing hemoptysis therefore pulmonary was consulted see below  Hospital-Problem based course  Dyspnea from a combination of volume overload-high-output heart failure from anemia ?  Right-sided pneumonia?  Interstitial lung disease Recent Citrobacter and given 1 month of antibiotics projected end date at the time of that  hospitalization was 05/04/2020 Antibiotics discontinued 3/7 as per pulmonology--BAL culture from 3/4 NGTD CT chest 3/4 =?  Right-sided pneumonia--pulmonology ordering ESR considering alternate diagnosis from pneumonia SLP 3/6 = mild aspiration risk only-MBS performed 3/7 cleared for regular diet Pleural effusion 1 view IR thoracentesis performed 3/2-with marked improvement Lowering  EDW per nephrology will help keep dry- Hemoptysis Underwent bronchoscopy at [place] fear BAL culture = Citrobacter negative for malignancy, negative for autoimmune disease-etiology felt to be?  DOAC Repeat BAL performed here 3/4 as above Acute anemia likely combination of renal disease, unclear blood loss  Hemoccult stool Given 1/2 units of blood on 3/1-hemoglobin ranging 7-8 Management as per nephrology with iron, aranesp ESRD MWF right upper extremity [place] Dry weight 61,  Defer to nephrology rest CAD status post stent 02/2020 drop in EF from 45 to 25% Medical treatment is very high risk for bleeding for left heart cath- Resumed aspirin Brilinta and Lipitor and cut back Toprol to 12.5 Hold lisinopril Chronic pain Home meds Percocet 1 tab every 4 as needed Uncontrolled DM TY 2  Glyburide initiated 3/6 CBG trends 1 83-42 COPD Herpes ophthalmicus  DVT prophylaxis: scd Code Status: FULL Family Communication: wife fully updated at the bedside and discussed with her on 3/6 and 3/7-phone #[phone] Disposition:  Status is: Inpatient  Remains inpatient appropriate because:Hemodynamically unstable, Altered mental status, Ongoing diagnostic testing needed not appropriate for outpatient work up, Unsafe d/c plan, IV treatments appropriate due to intensity of illness or inability to take PO and Inpatient level of care appropriate due to severity of illness   Dispo: The patient is from: Home  Anticipated d/c is to: tbd              Patient currently is not medically stable to d/c.    Difficult to place patient No  Consultants:   Nephrology   IR  Cardiology  Procedures: IR thoracentesis  Bronchoalveolar lavage on 3/4 Impression:            - Hemoptysis with abnormal CXR                        - The airway examination was normal.                        - Bronchoalveolar lavage was performed.                        - The airway examination was normal. Antimicrobials: Vanc Cefepime    Subjective:  Looks well feels  About to go for hemodialysis No chest pain no fever  Objective: Vitals:   05/25/20 1113 05/25/20 1244 05/25/20 1250 05/25/20 1300  BP: (!) 130/44 (!) 136/44 (!) 138/43 (!) 150/43  Pulse: 85     Resp: (!) _0 Temp: 98.3 F (36.8 C) 98.3 F (36.8 C)    TempSrc: Oral Oral    SpO2: 94% 94%    Weight:  63.8 kg    Height:        Intake/Output Summary (Last 24 hours) at 05/25/2020 1331 Last data filed at 05/24/2020 1700 Gross per 24 hour  Intake 370 ml  Output --  Net 370 ml   Filed Weights   05/24/20 0342 05/25/20 0500 05/25/20 1244  Weight: 59.2 kg 59.1 kg 63.8 kg    Examination:  EOMI NCAT no focal deficit looks well Chest clear no rales no rhonchi Abdomen soft nontender no rebound no guarding Neurologically intact moving all 4 limbs without focal deficit Moving all four limbs equally Neck soft supple  Data Reviewed: personally reviewed   CBC    Component Value Date/Time   WBC 8.9 05/25/2020 0048   RBC 2.68 (L) 05/25/2020 0048   HGB 7.9 (L) 05/25/2020 0048   HCT 25.5 (L) 05/25/2020 0048   PLT 202 05/25/2020 0048   MCV 95.1 05/25/2020 0048   MCH 29.5 05/25/2020 0048   MCHC 31.0 05/25/2020 0048   RDW 19.9 (H) 05/25/2020 0048   LYMPHSABS 1.1 05/25/2020 0048   MONOABS 0.4 05/25/2020 0048   EOSABS 0.2 05/25/2020 0048   BASOSABS 0.1 05/25/2020 0048   CMP Latest Ref Rng & Units 05/25/2020 05/24/2020 05/23/2020  Glucose 70 - 99 mg/dL 242(H) 246(H) 303(H)  BUN 8 - 23 mg/dL 59(H) 44(H) 28(H)  Creatinine 0.61 - 1.24 mg/dL  6.88(H) 5.50(H) 3.82(H)  Sodium 135 - 145 mmol/L 131(L) 135 132(L)  Potassium 3.5 - 5.1 mmol/L 3.6 3.5 3.8  Chloride 98 - 111 mmol/L 96(L) 99 97(L)  CO2 22 - 32 mmol/L 20(L) 22 24  Calcium 8.9 - 10.3 mg/dL 8.6(L) 8.3(L) 7.8(L)  Total Protein 6.5 - 8.1 g/dL 6.9 - -  Total Bilirubin 0.3 - 1.2 mg/dL 1.4(H) - -  Alkaline Phos 38 - 126 U/L 107 - -  AST 15 - 41 U/L 25 - -  ALT 0 - 44 U/L 26 - -     Radiology Studies: DG Swallowing Func-Speech Pathology  Result Date: 05/25/2020 Objective Swallowing Evaluation: Type of Study: MBS-Modified Barium Swallow Study  Patient Details  Name: Brandon Chaney MRN: [id number] Date of Birth: 1955/11/28 Today's Date: 05/25/2020 Time: SLP Start Time (ACUTE ONLY): 0859 -SLP Stop Time (ACUTE ONLY): 0911 SLP Time Calculation (min) (ACUTE ONLY): 12 min Past Medical History: Past Medical History: Diagnosis Date . CAD (coronary artery disease) 05/19/2020 . COPD (chronic obstructive pulmonary disease) ([place]) 05/19/2020 . ESRD (end stage renal disease) ([place]) 05/19/2020 . Herpes zoster ophthalmicus, right eye  Past Surgical History: Past Surgical History: Procedure Laterality Date . ARTERIOVENOUS GRAFT PLACEMENT Right  . CORONARY ANGIOPLASTY   . IR THORACENTESIS ASP PLEURAL SPACE W/IMG GUIDE  05/20/2020 HPI: Pt is a 65 y.o. male with medical history significant for ESRD on hemodialysis, COPD, coronary artery disease with stents, history of zoster ophthalmicus, recent hospital admission for pneumonia and symptomatic anemia, and supplemental oxygen requirement since the recent hospitalization. Pt presented to the ED for evaluation of chest pain, shortness of breath, and persistent cough.  CT chest 3/4: Right lower lobe pneumonia. SLP consulted due to concern for aspiration.  Subjective: Pt pleasant and cooperative Assessment / Plan / Recommendation CHL IP CLINICAL IMPRESSIONS 05/25/2020 Clinical Impression Pt presents with an oropharyngeal swallow that is [facility]. One instance of penetration (PAS 2) was  noted due to timing in which the bolus spilled under his epiglottis; however, the penetrates were cleared as the pt swallowed. Although penetration occured, his swallow still is efficient and safe with appropriate airway protection of all consistencies. Pt's subjective symptoms appear more esophageal so recomend follow up from GI if symptoms continue to persist. Provided education such as taking smaller sips and bites as well as sitting up right following PO intake to help with these subjective symptoms. Recomend that pt continuation a regular diet and thin liquids. SLP will sign off at this time. SLP Visit Diagnosis Dysphagia, unspecified (R13.10) Attention and concentration deficit following -- Frontal lobe and executive function deficit following -- Impact on safety and function Mild aspiration risk   CHL IP TREATMENT RECOMMENDATION 05/25/2020 Treatment Recommendations No treatment recommended at this time   Prognosis 05/25/2020 Prognosis for Safe Diet Advancement Good Barriers to Reach Goals -- Barriers/Prognosis Comment -- CHL IP DIET RECOMMENDATION 05/25/2020 SLP Diet Recommendations Regular solids;Thin liquid Liquid Administration via Cup;Straw Medication Administration Whole meds with liquid Compensations Slow rate;Small sips/bites Postural Changes Remain semi-upright after after feeds/meals (Comment);Seated upright at 90 degrees   CHL IP OTHER RECOMMENDATIONS 05/25/2020 Recommended Consults Consider GI evaluation Oral Care Recommendations Oral care BID Other Recommendations --   CHL IP FOLLOW UP RECOMMENDATIONS 05/25/2020 Follow up Recommendations None   CHL IP FREQUENCY AND DURATION 05/24/2020 Speech Therapy Frequency (ACUTE ONLY) min 1 x/week Treatment Duration 1 week      CHL IP ORAL PHASE 05/25/2020 Oral Phase WFL Oral - Pudding Teaspoon -- Oral - Pudding Cup -- Oral - Honey Teaspoon -- Oral - Honey Cup -- Oral - Nectar Teaspoon -- Oral - Nectar Cup -- Oral - Nectar Straw -- Oral - Thin Teaspoon -- Oral - Thin Cup --  Oral - Thin Straw -- Oral - Puree -- Oral - Mech Soft -- Oral - Regular -- Oral - Multi-Consistency -- Oral - Pill -- Oral Phase - Comment --  CHL IP PHARYNGEAL PHASE 05/25/2020 Pharyngeal Phase Impaired Pharyngeal- Pudding Teaspoon -- Pharyngeal -- Pharyngeal- Pudding Cup -- Pharyngeal -- Pharyngeal- Honey Teaspoon -- Pharyngeal -- Pharyngeal- Honey Cup -- Pharyngeal -- Pharyngeal- Nectar Teaspoon -- Pharyngeal -- Pharyngeal- Nectar Cup -- Pharyngeal -- Pharyngeal- Nectar Straw -- Pharyngeal -- Pharyngeal- Thin Teaspoon -- Pharyngeal --  Pharyngeal- Thin Cup WFL Pharyngeal -- Pharyngeal- Thin Straw Penetration/Aspiration before swallow Pharyngeal Material enters airway, remains ABOVE vocal cords then ejected out Pharyngeal- Puree WFL Pharyngeal -- Pharyngeal- Mechanical Soft -- Pharyngeal -- Pharyngeal- Regular WFL Pharyngeal -- Pharyngeal- Multi-consistency -- Pharyngeal -- Pharyngeal- Pill WFL Pharyngeal -- Pharyngeal Comment --  CHL IP CERVICAL ESOPHAGEAL PHASE 05/25/2020 Cervical Esophageal Phase WFL Pudding Teaspoon -- Pudding Cup -- Honey Teaspoon -- Honey Cup -- Nectar Teaspoon -- Nectar Cup -- Nectar Straw -- Thin Teaspoon -- Thin Cup -- Thin Straw -- Puree -- Mechanical Soft -- Regular -- Multi-consistency -- Pill -- Cervical Esophageal Comment -- Note populated for Lebron Conners, Student SLP Osie Bond., M.A. CCC-SLP Acute Rehabilitation Services Pager [phone] Office [phone] 05/25/2020, 10:28 AM                Scheduled Meds: . sodium chloride   Intravenous Once  . acidophilus  1 capsule Oral Daily  . aspirin EC  81 mg Oral Daily  . atorvastatin  80 mg Oral Daily  . Chlorhexidine Gluconate Cloth  6 each Topical Q0600  . cinacalcet  60 mg Oral Q M,W,F  . darbepoetin (ARANESP) injection - DIALYSIS  200 mcg Intravenous Q Wed-HD  . doxercalciferol  2 mcg Intravenous Q M,W,F-HD  . famotidine  20 mg Oral Q M,W,F  . feeding supplement (NEPRO CARB STEADY)  237 mL Oral BID BM  . glipiZIDE  2.5 mg  Oral QAC breakfast  . insulin aspart  0-9 Units Subcutaneous TID WC  . metoprolol succinate  12.5 mg Oral Daily  . mometasone-formoterol  2 puff Inhalation BID  . multivitamin  1 tablet Oral QHS  . ticagrelor  90 mg Oral BID  . umeclidinium bromide  1 puff Inhalation Daily   Continuous Infusions: . sodium chloride 250 mL (05/22/20 0847)     LOS: 6 days   Time spent: 25 minutes  Nita Sells, MD [facility] To contact the attending provider between 7A-7P or the covering provider during after hours 7P-7A, please log into the web site [email or web address] and access using universal [place] password for that web site. If you do not have the password, please call the hospital operator.  05/25/2020, 1:31 PM

## 2020-05-25 NOTE — Care Management Important Message (Signed)
Important Message  Patient Details  Name: Brandon Chaney MRN: [id number] Date of Birth: 1955-04-10   Medicare Important Message Given:  Yes     Orbie Pyo 05/25/2020, 3:26 PM

## 2020-05-25 NOTE — Plan of Care (Signed)

## 2020-05-25 NOTE — Progress Notes (Signed)
Modified Barium Swallow Progress Note  Patient Details  Name: Brandon Chaney MRN: [id number] Date of Birth: 05-06-1955  Today's Date: 05/25/2020  Modified Barium Swallow completed.  Full report located under Chart Review in the Imaging Section.  Brief recommendations include the following:  Clinical Impression  Pt presents with an oropharyngeal swallow that is [facility]. One instance of penetration (PAS 2) was noted due to timing in which the bolus spilled under his epiglottis; however, the penetrates were cleared as the pt swallowed. Although penetration occured, his swallow still is efficient and safe with appropriate airway protection of all consistencies. Pt's subjective symptoms appear more esophageal so recomend follow up from GI if symptoms continue to persist. Provided education such as taking smaller sips and bites as well as sitting up right following PO intake to help with these subjective symptoms. Recomend that pt continuation a regular diet and thin liquids. SLP will sign off at this time.   Swallow Evaluation Recommendations   Recommended Consults: Consider GI evaluation   SLP Diet Recommendations: Regular solids;Thin liquid   Liquid Administration via: Cup;Straw   Medication Administration: Whole meds with liquid   Supervision: Patient able to self feed;Intermittent supervision to cue for compensatory strategies   Compensations: Slow rate;Small sips/bites   Postural Changes: Remain semi-upright after after feeds/meals (Comment);Seated upright at 90 degrees   Oral Care Recommendations: Oral care BID        Jeanine Luz., SLP Student 05/25/2020,9:32 AM

## 2020-05-26 DIAGNOSIS — A419 Sepsis, unspecified organism: Secondary | ICD-10-CM | POA: Diagnosis not present

## 2020-05-26 DIAGNOSIS — J189 Pneumonia, unspecified organism: Secondary | ICD-10-CM | POA: Diagnosis not present

## 2020-05-26 LAB — CBC WITH DIFFERENTIAL/PLATELET
Abs Immature Granulocytes: 0 10*3/uL (ref 0.00–0.07)
Basophils Absolute: 0.1 10*3/uL (ref 0.0–0.1)
Basophils Relative: 1 %
Eosinophils Absolute: 0 10*3/uL (ref 0.0–0.5)
Eosinophils Relative: 0 %
HCT: 27.6 % — ABNORMAL LOW (ref 39.0–52.0)
Hemoglobin: 8.5 g/dL — ABNORMAL LOW (ref 13.0–17.0)
Lymphocytes Relative: 3 %
Lymphs Abs: 0.2 10*3/uL — ABNORMAL LOW (ref 0.7–4.0)
MCH: 29.4 pg (ref 26.0–34.0)
MCHC: 30.8 g/dL (ref 30.0–36.0)
MCV: 95.5 fL (ref 80.0–100.0)
Monocytes Absolute: 0 10*3/uL — ABNORMAL LOW (ref 0.1–1.0)
Monocytes Relative: 0 %
Neutro Abs: 6.4 10*3/uL (ref 1.7–7.7)
Neutrophils Relative %: 96 %
Platelets: 220 10*3/uL (ref 150–400)
RBC: 2.89 MIL/uL — ABNORMAL LOW (ref 4.22–5.81)
RDW: 20.4 % — ABNORMAL HIGH (ref 11.5–15.5)
WBC: 6.7 10*3/uL (ref 4.0–10.5)
nRBC: 0 % (ref 0.0–0.2)
nRBC: 1 /100 WBC — ABNORMAL HIGH

## 2020-05-26 LAB — GLUCOSE, CAPILLARY
Glucose-Capillary: 346 mg/dL — ABNORMAL HIGH (ref 70–99)
Glucose-Capillary: 294 mg/dL — ABNORMAL HIGH (ref 70–99)
Glucose-Capillary: 290 mg/dL — ABNORMAL HIGH (ref 70–99)
Glucose-Capillary: 329 mg/dL — ABNORMAL HIGH (ref 70–99)

## 2020-05-26 MED ORDER — INSULIN ASPART 100 UNIT/ML ~~LOC~~ SOLN: 5.0000 [IU] | Freq: Three times a day (TID) | SUBCUTANEOUS | Status: DC

## 2020-05-26 MED ORDER — GLIPIZIDE 5 MG PO TABS
5.0000 mg | ORAL_TABLET | Freq: Every day | ORAL | Status: DC
  Administered 2020-05-27 – 2020-05-30 (×4): 5 mg via ORAL
  Filled 2020-05-26 (×4): qty 1

## 2020-05-26 MED ORDER — INSULIN ASPART 100 UNIT/ML ~~LOC~~ SOLN: 7.0000 [IU] | Freq: Three times a day (TID) | SUBCUTANEOUS | Status: DC

## 2020-05-26 NOTE — Progress Notes (Signed)
PROGRESS NOTE   Brandon Chaney  KVQ:[id number] DOB: [birth year] DOA: 05/19/2020 PCP: Patient, No Pcp Per  Brief Narrative:  65 year old BM ESRD on HD-in [place]-proximal right upper extremity graft Dry weight is 61 kg COPD-mod CAD + stents 02/2020 EF dropped from 45% to 25% recently Herpes zoster ophthalmicus admitted from 1/16- 1/22 and 1/26 through 2/11: EF of 20 to 25% down from 45 to 50%, anemia and pneumonia.  High risk candidate per cardiology-medical management pursued-- was also seen by ID for Citrobacter koseri that grew on his culture after he underwent bronchoscopy.  He was treated with ceftriaxone and oral cefdinir. He also went home on O2. -P/C- ANCA negative, ANA negative dsDNA negative anti-CCP negative rheumatoid marginally positive less likely autoimmune and sent home on cefdinir 2/14 -Quantiferon negative ? 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 Bolused 500 cc IV fluid in ED  Transferred to [facility] found to be febrile 102 CXR infiltrates White count 11 lactic acid 2 procalcitonin 1.4 CRP 49 INR 1.3 transferred to [facility] long after receiving LR ASA Rocephin vancomycin Tylenol  On admission to [facility] showed BUN/creatinine 33/4.1 Potassium 3.1 Iron 34 saturation was 21 ferritin above 7500 Procalcitonin 23  Since admit 3/1admission- transferred [facility] to [facility] for maintenance HD,  1.3 L fluid removal ongoing hemoptysis therefore pulmonary was consulted see below  Hospital-Problem based course  Dyspnea from a combination of volume overload-high-output heart failure from anemia ?  Right-sided pneumonia?  Interstitial lung disease Recent Citrobacter and given 1 month of antibiotics projected end date at the time of that hospitalization was  05/04/2020 Antibiotics d/c 3/7 [5 days iv] as per pulmonology--BAL culture from 3/4 NGTD-May need outpatient repeat CT chest 3/4 =?  Right-sided pneumonia ddx autoimmune cause pulmonology started Solu-Medrol, thinks SLP 3/6 = mild aspiration risk only-MBS performed 3/7 cleared for regular diet tolerating well Pleural effusion 1 view IR thoracentesis performed 3/2-with marked improvement Lowering EDW per nephrology  Hemoptysis Underwent bronchoscopy at [place] [facility] BAL culture = Citrobacter negative for malignancy, negative for autoimmune disease-etiology felt to be?  DOAC Repeat BAL performed here 3/4 as above ?  Repeat after steroids complete in outpatient setting Dr. Milinda Antis Acute anemia likely combination of renal disease, unclear blood loss  Hemoccult stool Given 1/2 units of blood on 3/1-hemoglobin ranging 7-8 Management as per nephrology with iron, aranesp ESRD MWF right upper extremity [place] Dry weight 61, currently 57 Defer to nephrology rest CAD status post stent 02/2020 drop in EF from 45 to 25% Medical treatment is very high risk for bleeding for left heart cath- Resumed aspirin Brilinta and Lipitor and cut back Toprol to 12.5 Hold lisinopril Chronic pain Home meds Percocet 1 tab every 4 as needed Uncontrolled DM TY 2  Glyburide initiated 3/6 CBG trends in the 300s because of dietary    none compliance--increase insulin to 3 times daily 7 units-no long-acting at this time  I have encouraged him to stop eating sweets and peach cobbler today COPD Herpes ophthalmicus  DVT prophylaxis: scd Code Status: FULL Family Communication: wife fully updated at the bedside and discussed with her on 3/6 and 3/7-phone #[phone] Disposition:  Status is: Inpatient  Remains inpatient appropriate because:Hemodynamically unstable, Altered mental status, Ongoing diagnostic testing needed not appropriate for outpatient work up,  Unsafe d/c plan, IV treatments appropriate due to  intensity of illness or inability to take PO and Inpatient level of care appropriate due to severity of illness   Dispo: The patient is from: Home              Anticipated d/c is to: tbd              Patient currently is not medically stable to d/c.   Difficult to place patient No  Consultants:   Nephrology   IR  Cardiology  Pulmonology  Procedures: IR thoracentesis  Bronchoalveolar lavage on 3/4 Impression:            - Hemoptysis with abnormal CXR                        - The airway examination was normal.                        - Bronchoalveolar lavage was performed.                        - The airway examination was normal. Antimicrobials: Vanc Cefepime    Subjective:  No fever no nausea no vomiting sitting up very comfortable eating drinking Chocolate bar at bedside eating peach cobbler at night  Seems happy  Objective: Vitals:   05/26/20 0418 05/26/20 0811 05/26/20 0840 05/26/20 1105  BP: (!) 114/34 (!) 158/50  (!) 125/38  Pulse: 81 87  85  Resp:      Temp: 98.2 F (36.8 C) 97.9 F (36.6 C)  98.3 F (36.8 C)  TempSrc: Oral Oral  Oral  SpO2: 93%  98%   Weight: 57.8 kg     Height:        Intake/Output Summary (Last 24 hours) at 05/26/2020 1424 Last data filed at 05/26/2020 1108 Gross per 24 hour  Intake 120 ml  Output 2600 ml  Net -2480 ml   Filed Weights   05/25/20 1244 05/25/20 1554 05/26/20 0418  Weight: 63.8 kg 60.5 kg 57.8 kg    Examination:  Coherent pleasant no distress EOMI NCAT no focal deficit CTA B no added sound-no rales rhonchi Abdomen soft no rebound no guarding Moving all 4 limbs equally Transmetatarsal on left side examined clean Neurologically otherwise is intact  Data Reviewed: personally reviewed   CBC    Component Value Date/Time   WBC 6.7 05/26/2020 0040   RBC 2.89 (L) 05/26/2020 0040   HGB 8.5 (L) 05/26/2020 0040   HCT 27.6 (L) 05/26/2020 0040   PLT 220 05/26/2020 0040   MCV 95.5 05/26/2020 0040   MCH 29.4  05/26/2020 0040   MCHC 30.8 05/26/2020 0040   RDW 20.4 (H) 05/26/2020 0040   LYMPHSABS 0.2 (L) 05/26/2020 0040   MONOABS 0.0 (L) 05/26/2020 0040   EOSABS 0.0 05/26/2020 0040   BASOSABS 0.1 05/26/2020 0040   CMP Latest Ref Rng & Units 05/26/2020 05/25/2020 05/24/2020  Glucose 70 - 99 mg/dL 318(H) 242(H) 246(H)  BUN 8 - 23 mg/dL 31(H) 59(H) 44(H)  Creatinine 0.61 - 1.24 mg/dL 4.24(H) 6.88(H) 5.50(H)  Sodium 135 - 145 mmol/L 133(L) 131(L) 135  Potassium 3.5 - 5.1 mmol/L 3.5 3.6 3.5  Chloride 98 - 111 mmol/L 96(L) 96(L) 99  CO2 22 - 32 mmol/L 25 20(L) 22  Calcium 8.9 - 10.3 mg/dL 8.6(L) 8.6(L) 8.3(L)  Total Protein 6.5 - 8.1 g/dL 7.3 6.9 -  Total Bilirubin 0.3 - 1.2 mg/dL 1.3(H) 1.4(H) -  Alkaline Phos 38 - 126 U/L 106 107 -  AST 15 - 41 U/L 28 25 -  ALT 0 - 44 U/L 25 26 -     Radiology Studies: DG Swallowing Func-Speech Pathology  Result Date: 05/25/2020 Objective Swallowing Evaluation: Type of Study: MBS-Modified Barium Swallow Study  Patient Details Name: Brandon Chaney MRN: [id number] Date of Birth: 11-29-1955 Today's Date: 05/25/2020 Time: SLP Start Time (ACUTE ONLY): 0859 -SLP Stop Time (ACUTE ONLY): 3614 SLP Time Calculation (min) (ACUTE ONLY): 12 min Past Medical History: Past Medical History: Diagnosis Date . CAD (coronary artery disease) 05/19/2020 . COPD (chronic obstructive pulmonary disease) ([place]) 05/19/2020 . ESRD (end stage renal disease) ([place]) 05/19/2020 . Herpes zoster ophthalmicus, right eye  Past Surgical History: Past Surgical History: Procedure Laterality Date . ARTERIOVENOUS GRAFT PLACEMENT Right  . CORONARY ANGIOPLASTY   . IR THORACENTESIS ASP PLEURAL SPACE W/IMG GUIDE  05/20/2020 HPI: Pt is a 65 y.o. male with medical history significant for ESRD on hemodialysis, COPD, coronary artery disease with stents, history of zoster ophthalmicus, recent hospital admission for pneumonia and symptomatic anemia, and supplemental oxygen requirement since the recent hospitalization. Pt presented to the  ED for evaluation of chest pain, shortness of breath, and persistent cough.  CT chest 3/4: Right lower lobe pneumonia. SLP consulted due to concern for aspiration.  Subjective: Pt pleasant and cooperative Assessment / Plan / Recommendation CHL IP CLINICAL IMPRESSIONS 05/25/2020 Clinical Impression Pt presents with an oropharyngeal swallow that is [facility]. One instance of penetration (PAS 2) was noted due to timing in which the bolus spilled under his epiglottis; however, the penetrates were cleared as the pt swallowed. Although penetration occured, his swallow still is efficient and safe with appropriate airway protection of all consistencies. Pt's subjective symptoms appear more esophageal so recomend follow up from GI if symptoms continue to persist. Provided education such as taking smaller sips and bites as well as sitting up right following PO intake to help with these subjective symptoms. Recomend that pt continuation a regular diet and thin liquids. SLP will sign off at this time. SLP Visit Diagnosis Dysphagia, unspecified (R13.10) Attention and concentration deficit following -- Frontal lobe and executive function deficit following -- Impact on safety and function Mild aspiration risk   CHL IP TREATMENT RECOMMENDATION 05/25/2020 Treatment Recommendations No treatment recommended at this time   Prognosis 05/25/2020 Prognosis for Safe Diet Advancement Good Barriers to Reach Goals -- Barriers/Prognosis Comment -- CHL IP DIET RECOMMENDATION 05/25/2020 SLP Diet Recommendations Regular solids;Thin liquid Liquid Administration via Cup;Straw Medication Administration Whole meds with liquid Compensations Slow rate;Small sips/bites Postural Changes Remain semi-upright after after feeds/meals (Comment);Seated upright at 90 degrees   CHL IP OTHER RECOMMENDATIONS 05/25/2020 Recommended Consults Consider GI evaluation Oral Care Recommendations Oral care BID Other Recommendations --   CHL IP FOLLOW UP RECOMMENDATIONS 05/25/2020 Follow up  Recommendations None   CHL IP FREQUENCY AND DURATION 05/24/2020 Speech Therapy Frequency (ACUTE ONLY) min 1 x/week Treatment Duration 1 week      CHL IP ORAL PHASE 05/25/2020 Oral Phase WFL Oral - Pudding Teaspoon -- Oral - Pudding Cup -- Oral - Honey Teaspoon -- Oral - Honey Cup -- Oral - Nectar Teaspoon -- Oral - Nectar Cup -- Oral - Nectar Straw -- Oral - Thin Teaspoon -- Oral - Thin Cup -- Oral - Thin Straw -- Oral - Puree -- Oral - Mech Soft -- Oral - Regular -- Oral - Multi-Consistency -- Oral -  Pill -- Oral Phase - Comment --  CHL IP PHARYNGEAL PHASE 05/25/2020 Pharyngeal Phase Impaired Pharyngeal- Pudding Teaspoon -- Pharyngeal -- Pharyngeal- Pudding Cup -- Pharyngeal -- Pharyngeal- Honey Teaspoon -- Pharyngeal -- Pharyngeal- Honey Cup -- Pharyngeal -- Pharyngeal- Nectar Teaspoon -- Pharyngeal -- Pharyngeal- Nectar Cup -- Pharyngeal -- Pharyngeal- Nectar Straw -- Pharyngeal -- Pharyngeal- Thin Teaspoon -- Pharyngeal -- Pharyngeal- Thin Cup WFL Pharyngeal -- Pharyngeal- Thin Straw Penetration/Aspiration before swallow Pharyngeal Material enters airway, remains ABOVE vocal cords then ejected out Pharyngeal- Puree WFL Pharyngeal -- Pharyngeal- Mechanical Soft -- Pharyngeal -- Pharyngeal- Regular WFL Pharyngeal -- Pharyngeal- Multi-consistency -- Pharyngeal -- Pharyngeal- Pill WFL Pharyngeal -- Pharyngeal Comment --  CHL IP CERVICAL ESOPHAGEAL PHASE 05/25/2020 Cervical Esophageal Phase WFL Pudding Teaspoon -- Pudding Cup -- Honey Teaspoon -- Honey Cup -- Nectar Teaspoon -- Nectar Cup -- Nectar Straw -- Thin Teaspoon -- Thin Cup -- Thin Straw -- Puree -- Mechanical Soft -- Regular -- Multi-consistency -- Pill -- Cervical Esophageal Comment -- Note populated for Lebron Conners, Student SLP Osie Bond., M.A. [facility] Pager [phone] Office [phone] 05/25/2020, 10:28 AM                Scheduled Meds: . acidophilus  1 capsule Oral Daily  . aspirin EC  81 mg Oral Daily  . atorvastatin  80  mg Oral Daily  . Chlorhexidine Gluconate Cloth  6 each Topical Q0600  . cinacalcet  60 mg Oral Q M,W,F  . darbepoetin (ARANESP) injection - DIALYSIS  200 mcg Intravenous Q Wed-HD  . doxercalciferol  2 mcg Intravenous Q M,W,F-HD  . famotidine  20 mg Oral Q M,W,F  . feeding supplement (NEPRO CARB STEADY)  237 mL Oral BID BM  . glipiZIDE  2.5 mg Oral QAC breakfast  . insulin aspart  0-9 Units Subcutaneous TID WC  . insulin aspart  5 Units Subcutaneous TID WC  . methylPREDNISolone (SOLU-MEDROL) injection  60 mg Intravenous Q24H  . metoprolol succinate  12.5 mg Oral Daily  . mometasone-formoterol  2 puff Inhalation BID  . multivitamin  1 tablet Oral QHS  . ticagrelor  90 mg Oral BID  . umeclidinium bromide  1 puff Inhalation Daily   Continuous Infusions: . sodium chloride 250 mL (05/22/20 0847)     LOS: 7 days   Time spent: 35 minutes  Nita Sells, MD [facility] To contact the attending provider between 7A-7P or the covering provider during after hours 7P-7A, please log into the web site [email or web address] and access using universal [place] password for that web site. If you do not have the password, please call the hospital operator.  05/26/2020, 2:24 PM

## 2020-05-26 NOTE — Progress Notes (Signed)
Nutrition Follow-up  DOCUMENTATION CODES:   Severe malnutrition in context of chronic illness  INTERVENTION:   - Continue Nepro Shake po BID, each supplement provides 425 kcal and 19 grams protein  - Continue renal MVI daily  - Encourage adequate PO intake  NUTRITION DIAGNOSIS:   Severe Malnutrition related to chronic illness (ESRD on HD, COPD) as evidenced by severe fat depletion,severe muscle depletion,moderate fat depletion,moderate muscle depletion.  Ongoing  GOAL:   Patient will meet greater than or equal to 90% of their needs  Progressing  MONITOR:   PO intake,Supplement acceptance,Skin,Labs,Weight trends  REASON FOR ASSESSMENT:   Malnutrition Screening Tool    ASSESSMENT:   89 YOM admitted for sepsis due to pneumonia. PMH of ESRD on HD via AV graft, CKD MBD, COPD, CAD s/p arteriovenous graft s/p coronary angioplasty, herpes zoster ophthalmicus (right eye). Recent hospital admit (~2 weeks ago) for pne and symptomatic anemia, supplement oxygen req since recent hospitalization.  3/03 - IR thoracentesis  3/04 - bronchoscopy 3/07 - MBS with recommendations for regular textures with thin liquids  Noted last HD was on 3/07 with 2500 ml net UF. Post-HD weight was 60.5 kg which is below EDW of 61.5 kg. Current weight is 58.4 kg. Next HD scheduled for today.  Spoke with pt at bedside. Pt resting in recliner at time of RD visit. Pt reports good appetite and states that his PO intake is "picking up." Pt reports eating everything on his breakfast meal tray this morning except for the frosted flakes and milk. He is saving this for later for a snack.  Pt reports that he has been drinking the Nepro Shakes twice a day and is willing to continue this.  Pt states that this is the best that he has felt since January of this year. Pt is very appreciative of the care he has received.  RD took pt's dinner and breakfast meal orders.  Admit weight: 61.9 kg Current weight: 58.4  kg  Meal Completion: 50-100%  Medications reviewed and include: sensipar, aranesp, hectorol, pepcid, Nepro BID, glipizide, SSI q meals (pt refusing), novolog 12 units TID with meals (pt refusing), rena-vit, prednisone  Labs reviewed: sodium 134, hemoglobin 8.1 CBG's: 245-329 x 24 hours  UOP: 400 ml x 24 hours I/O's: -4.6 L since admit  Diet Order:   Diet Order            Diet renal/carb modified with fluid restriction Diet-HS Snack? Nothing; Fluid restriction: 1200 mL Fluid; Room service appropriate? Yes; Fluid consistency: Thin  Diet effective now                 EDUCATION NEEDS:   No education needs have been identified at this time  Skin:  Skin Assessment: Reviewed RN Assessment  Last BM:  05/27/20 per pt report  Height:   Ht Readings from Last 1 Encounters:  05/19/20 '5\' 9"'$  (1.753 m)    Weight:   Wt Readings from Last 1 Encounters:  05/27/20 58.4 kg    Ideal Body Weight:  72.7 kg  BMI:  Body mass index is 19.01 kg/m.  Estimated Nutritional Needs:   Kcal:  2000-2200  Protein:  100-115 g  Fluid:  1.2 L    Gustavus Bryant, MS, RD, LDN Inpatient Clinical Dietitian Please see AMiON for contact information.

## 2020-05-26 NOTE — Consult Note (Signed)
NAME:  Brandon Chaney, MRN:  [id number], DOB:  21-Aug-1955, LOS: 7 ADMISSION DATE:  05/19/2020, CONSULTATION DATE:  05/21/20 REFERRING MD:  Maren Beach - TRH, CHIEF COMPLAINT:  Hemoptysis   Brief History:  65 yo persistent small volume hemoptysis since December 2021. Repeatedly tx for PNA -- recent admit to [facility] for citrobacter koseri   History of Present Illness:  65 yo M PMH HFrEF, ESRD, COPD, CAD, anemia who presented to [facility] ED 3/122 with chest pain SOB cough and hemoptysis. Recent hospitalization for PNA (citrobacater koseri) and symptomatic anemia requiring transfusion and was discharged on O2. Symptoms have not improved since discharge, and pt endorses small volume hemoptysis since December 2021.  In ED, CXR revealed bilateral upper lobe opacities, pt started on rocephin and vanc. Admitted to [facility]. Transferred to [facility] due to iHD need. Underwent [facility] with IR 3/2 with 1.3L removed.  PCCM consulted 3/3 for hemoptysis evaluation   Past Medical History:  HFrEF  ESRD COPD CAD Zoster Ophthalmicus  PNA Anemia  Rheumatoid Arthritis  Significant Hospital Events:  3/1 admitted to [facility] 3/2 transfer to [facility] with IR 3/3 CCM consult for persistent hemoptysis   Consults:  IR CCM  Procedures:  3/2 R Thora> 1.3L off   Significant Diagnostic Tests:  3/1 NM pulm perfusion> small nonsegmetal defects, felt to be r/t PNA not PE 3/2 CXR> Bilateral infiltrates and edema.   Micro Data:  3/1 sputum> rare WBC, few squamous cells, no orgs  3/1 covid negative, flu a/b negative   Antimicrobials:  Vanc  Cefepime  Interim History / Subjective:  No acute events overnight.   He had elevated ESR and CRP yesterday with procalcitonin down to 5 from 20 previously.   Started solumedrol 65m IV daily yesterday.  Objective   Blood pressure (!) 125/38, pulse 85, temperature 98.3 F (36.8 C), temperature source Oral, resp. rate 14, height 5' 9" (1.753 m), weight 57.8  kg, SpO2 98 %.        Intake/Output Summary (Last 24 hours) at 05/26/2020 1311 Last data filed at 05/26/2020 1108 Gross per 24 hour  Intake 120 ml  Output 2600 ml  Net -2480 ml   Filed Weights   05/25/20 1244 05/25/20 1554 05/26/20 0418  Weight: 63.8 kg 60.5 kg 57.8 kg    Examination: General: elderly male, no acute distress, sitting up on side of bed eating HENT: [place]/AT, PERRL, sclera anicteric, moist mucous membranes Lungs: no wheezing or rhonchi. Bibasilar crackles Cardiovascular: rrr cap refill < 3 sec  Abdomen: soft  thin + bowel sounds  Extremities: L AVG. No acute deformity. Lower extremity s/p metatarsal amputation. No peripheral edema Neuro: AAOx4 following commands  Resolved Hospital Problem list     Assessment & Plan:    Acute hypoxemic respiratory failure  Hemoptysis with hemosiderin laden macrophages on BAL Recent multifocal pna  COPD ESRD  CAD s/p PCI  HFrEF   Patient's respiratory failure has imroved as he is off supplemental oxygen at this time. The BAL on the bronchoscopy has hemosiderin laden macrophages concerning for pulmonary capillaritis or diffuse alveolar hemorrhage which is in the setting of recent pneumonia and dual antiplatelet therapy for the coronary stents.  ESR and CRP elevated on 3/7 with procalcitonin down from admission.   Started solumedrol 656mdaily on 3/8. Will plan to do 2-3 days of solumedrol 6035maily then taper his steroids. If he requires prolonged taper we will setup follow up in our clinic.  The  right pleural effusion appears transudative in nature based on total protein ratio. Likely in setting of heart failure and ESRD he has bilateral effusions.  Continue incruse and dulera for his COPD.   Labs   CBC: Recent Labs  Lab 05/21/20 0120 05/21/20 2226 05/22/20 1417 05/24/20 0142 05/25/20 0048 05/26/20 0040  WBC 8.1 7.6 7.8 7.2 8.9 6.7  NEUTROABS 6.6 6.1  --  5.9 7.1 6.4  HGB 8.2* 8.6* 8.0* 8.4* 7.9* 8.5*  HCT 26.4*  28.3* 24.2* 26.8* 25.5* 27.6*  MCV 92.6 95.0 90.0 94.7 95.1 95.5  PLT 198 223 204 210 202 220    Basic Metabolic Panel: Recent Labs  Lab 05/20/20 0312 05/21/20 0120 05/21/20 2226 05/22/20 1417 05/23/20 0109 05/24/20 0142 05/25/20 0048 05/26/20 0040  NA 135 136 131* 132* 132* 135 131* 133*  K 3.3* 3.2* 3.3* 3.6 3.8 3.5 3.6 3.5  CL 95* 99 93* 96* 97* 99 96* 96*  CO2 24 24 24 21* 24 22 20* 25  GLUCOSE 170* 192* 273* 325* 303* 246* 242* 318*  BUN 51* 28* 43* 51* 28* 44* 59* 31*  CREATININE 5.75* 4.37* 5.70* 6.75* 3.82* 5.50* 6.88* 4.24*  CALCIUM 7.5* 8.0* 8.2* 8.0* 7.8* 8.3* 8.6* 8.6*  MG 2.0 1.9 2.0  --  1.8 1.8  --   --   PHOS 4.2 2.7 2.0* 2.8 1.7* 1.6*  --   --    GFR: Estimated Creatinine Clearance: 14.2 mL/min (A) (by C-G formula based on SCr of 4.24 mg/dL (H)). Recent Labs  Lab 05/20/20 0312 05/21/20 0120 05/22/20 1417 05/24/20 0142 05/25/20 0048 05/25/20 1154 05/26/20 0040  PROCALCITON 38.64  --   --   --   --  5.95  --   WBC 8.6   < > 7.8 7.2 8.9  --  6.7   < > = values in this interval not displayed.    Liver Function Tests: Recent Labs  Lab 05/22/20 1417 05/25/20 0048 05/26/20 0040  AST  --  25 28  ALT  --  26 25  ALKPHOS  --  107 106  BILITOT  --  1.4* 1.3*  PROT  --  6.9 7.3  ALBUMIN 2.2* 2.3* 2.3*   No results for input(s): LIPASE, AMYLASE in the last 168 hours. No results for input(s): AMMONIA in the last 168 hours.  ABG No results found for: PHART, PCO2ART, PO2ART, HCO3, TCO2, ACIDBASEDEF, O2SAT   Coagulation Profile: No results for input(s): INR, PROTIME in the last 168 hours.  Cardiac Enzymes: No results for input(s): CKTOTAL, CKMB, CKMBINDEX, TROPONINI in the last 168 hours.  HbA1C: Hgb A1c MFr Bld  Date/Time Value Ref Range Status  05/25/2020 05:02 PM 5.6 4.8 - 5.6 % Final    Comment:    (NOTE) Pre diabetes:          5.7%-6.4%  Diabetes:              >6.4%  Glycemic control for   <7.0% adults with diabetes      CBG: Recent Labs  Lab 05/25/20 1115 05/25/20 1627 05/25/20 2129 05/26/20 0559 05/26/20 1104  GLUCAP 204* 105* 263* 346* 294*    Brandon Dewald, MD [place] Office: [phone]   See Amion for Pager Details     

## 2020-05-26 NOTE — Progress Notes (Signed)
Subjective:  Pt seen in room, in good spirits, singing loudly to his sister on the phone.    Objective Vital signs in last 24 hours: Vitals:   05/26/20 0418 05/26/20 0811 05/26/20 0840 05/26/20 1105  BP: (!) 114/34 (!) 158/50  (!) 125/38  Pulse: 81 87  85  Resp:      Temp: 98.2 F (36.8 C) 97.9 F (36.6 C)  98.3 F (36.8 C)  TempSrc: Oral Oral  Oral  SpO2: 93%  98%   Weight: 57.8 kg     Height:       Weight change: 4.7 kg  Intake/Output Summary (Last 24 hours) at 05/26/2020 1401 Last data filed at 05/26/2020 1108 Gross per 24 hour  Intake 120 ml  Output 2600 ml  Net -2480 ml   OP HD: MWF [facility] [place]  3.5h  61.5kg  Hep none  LUA AVG  sensipar 60 q HD  hectorol 2 q tx  Last hgb 6.9 on 2/21  Assessment/Plan: 65 year old BM with ESRD and COPD-  Recent hosp for PNA did not recover -  Very anemic and with pleural effusion 1. SOB/ pneumonia: hx of COPD and here w/ PNA requiring 2nd round of abx, improving. Pulmonary involved - s/p bronch which looked normal. Per pmd.   2. ESRD: MWF HD.  Next HD tomorrow.  3. Hypertension/volume: BP's good, under dry wt 3-4 kg, euvolemic on exam. Getting low dose metoprolol xl 12.5 qd.  4. Anemia ckd: anemia seems out of proportion to just ESRD.  Got prbc's last week. Giving IV iron and high dose ESA to assist as well. Hb 8.5 now.  5. Metabolic Bone Disease: We will continue his home doses of Hectorol, Sensipar - hold phoslo for now as he does not seem to be eating much and phos is OK/low 6. DM-  Per primary  7. Dispo - looking much better. Per primary MD.   Kelly Splinter, MD 05/26/2020, 2:01 PM    Labs: Basic Metabolic Panel: Recent Labs  Lab 05/22/20 1417 05/23/20 0109 05/24/20 0142 05/25/20 0048 05/26/20 0040  NA 132* 132* 135 131* 133*  K 3.6 3.8 3.5 3.6 3.5  CL 96* 97* 99 96* 96*  CO2 21* 24 22 20* 25  GLUCOSE 325* 303* 246* 242* 318*  BUN 51* 28* 44* 59* 31*  CREATININE 6.75* 3.82* 5.50* 6.88* 4.24*  CALCIUM 8.0* 7.8*  8.3* 8.6* 8.6*  PHOS 2.8 1.7* 1.6*  --   --    Liver Function Tests: Recent Labs  Lab 05/22/20 1417 05/25/20 0048 05/26/20 0040  AST  --  25 28  ALT  --  26 25  ALKPHOS  --  107 106  BILITOT  --  1.4* 1.3*  PROT  --  6.9 7.3  ALBUMIN 2.2* 2.3* 2.3*   No results for input(s): LIPASE, AMYLASE in the last 168 hours. No results for input(s): AMMONIA in the last 168 hours. CBC: Recent Labs  Lab 05/21/20 2226 05/22/20 1417 05/24/20 0142 05/25/20 0048 05/26/20 0040  WBC 7.6 7.8 7.2 8.9 6.7  NEUTROABS 6.1  --  5.9 7.1 6.4  HGB 8.6* 8.0* 8.4* 7.9* 8.5*  HCT 28.3* 24.2* 26.8* 25.5* 27.6*  MCV 95.0 90.0 94.7 95.1 95.5  PLT 223 204 210 202 220   Cardiac Enzymes: No results for input(s): CKTOTAL, CKMB, CKMBINDEX, TROPONINI in the last 168 hours. CBG: Recent Labs  Lab 05/25/20 1115 05/25/20 1627 05/25/20 2129 05/26/20 0559 05/26/20 1104  GLUCAP 204* 105* 263*  346* 294*    Iron Studies:  No results for input(s): IRON, TIBC, TRANSFERRIN, FERRITIN in the last 72 hours. Studies/Results: DG Swallowing Func-Speech Pathology  Result Date: 05/25/2020 Objective Swallowing Evaluation: Type of Study: MBS-Modified Barium Swallow Study  Patient Details Name: Win Raymundo MRN: [id number] Date of Birth: 1956-02-22 Today's Date: 05/25/2020 Time: SLP Start Time (ACUTE ONLY): 0859 -SLP Stop Time (ACUTE ONLY): 0911 SLP Time Calculation (min) (ACUTE ONLY): 12 min Past Medical History: Past Medical History: Diagnosis Date . CAD (coronary artery disease) 05/19/2020 . COPD (chronic obstructive pulmonary disease) ([place]) 05/19/2020 . ESRD (end stage renal disease) ([place]) 05/19/2020 . Herpes zoster ophthalmicus, right eye  Past Surgical History: Past Surgical History: Procedure Laterality Date . ARTERIOVENOUS GRAFT PLACEMENT Right  . CORONARY ANGIOPLASTY   . IR THORACENTESIS ASP PLEURAL SPACE W/IMG GUIDE  05/20/2020 HPI: Pt is a 65 y.o. male with medical history significant for ESRD on hemodialysis, COPD, coronary  artery disease with stents, history of zoster ophthalmicus, recent hospital admission for pneumonia and symptomatic anemia, and supplemental oxygen requirement since the recent hospitalization. Pt presented to the ED for evaluation of chest pain, shortness of breath, and persistent cough.  CT chest 3/4: Right lower lobe pneumonia. SLP consulted due to concern for aspiration.  Subjective: Pt pleasant and cooperative Assessment / Plan / Recommendation CHL IP CLINICAL IMPRESSIONS 05/25/2020 Clinical Impression Pt presents with an oropharyngeal swallow that is [facility]. One instance of penetration (PAS 2) was noted due to timing in which the bolus spilled under his epiglottis; however, the penetrates were cleared as the pt swallowed. Although penetration occured, his swallow still is efficient and safe with appropriate airway protection of all consistencies. Pt's subjective symptoms appear more esophageal so recomend follow up from GI if symptoms continue to persist. Provided education such as taking smaller sips and bites as well as sitting up right following PO intake to help with these subjective symptoms. Recomend that pt continuation a regular diet and thin liquids. SLP will sign off at this time. SLP Visit Diagnosis Dysphagia, unspecified (R13.10) Attention and concentration deficit following -- Frontal lobe and executive function deficit following -- Impact on safety and function Mild aspiration risk   CHL IP TREATMENT RECOMMENDATION 05/25/2020 Treatment Recommendations No treatment recommended at this time   Prognosis 05/25/2020 Prognosis for Safe Diet Advancement Good Barriers to Reach Goals -- Barriers/Prognosis Comment -- CHL IP DIET RECOMMENDATION 05/25/2020 SLP Diet Recommendations Regular solids;Thin liquid Liquid Administration via Cup;Straw Medication Administration Whole meds with liquid Compensations Slow rate;Small sips/bites Postural Changes Remain semi-upright after after feeds/meals (Comment);Seated upright at  90 degrees   CHL IP OTHER RECOMMENDATIONS 05/25/2020 Recommended Consults Consider GI evaluation Oral Care Recommendations Oral care BID Other Recommendations --   CHL IP FOLLOW UP RECOMMENDATIONS 05/25/2020 Follow up Recommendations None   CHL IP FREQUENCY AND DURATION 05/24/2020 Speech Therapy Frequency (ACUTE ONLY) min 1 x/week Treatment Duration 1 week      CHL IP ORAL PHASE 05/25/2020 Oral Phase WFL Oral - Pudding Teaspoon -- Oral - Pudding Cup -- Oral - Honey Teaspoon -- Oral - Honey Cup -- Oral - Nectar Teaspoon -- Oral - Nectar Cup -- Oral - Nectar Straw -- Oral - Thin Teaspoon -- Oral - Thin Cup -- Oral - Thin Straw -- Oral - Puree -- Oral - Mech Soft -- Oral - Regular -- Oral - Multi-Consistency -- Oral - Pill -- Oral Phase - Comment --  CHL IP PHARYNGEAL PHASE 05/25/2020 Pharyngeal Phase Impaired Pharyngeal- Pudding Teaspoon --  Pharyngeal -- Pharyngeal- Pudding Cup -- Pharyngeal -- Pharyngeal- Honey Teaspoon -- Pharyngeal -- Pharyngeal- Honey Cup -- Pharyngeal -- Pharyngeal- Nectar Teaspoon -- Pharyngeal -- Pharyngeal- Nectar Cup -- Pharyngeal -- Pharyngeal- Nectar Straw -- Pharyngeal -- Pharyngeal- Thin Teaspoon -- Pharyngeal -- Pharyngeal- Thin Cup WFL Pharyngeal -- Pharyngeal- Thin Straw Penetration/Aspiration before swallow Pharyngeal Material enters airway, remains ABOVE vocal cords then ejected out Pharyngeal- Puree WFL Pharyngeal -- Pharyngeal- Mechanical Soft -- Pharyngeal -- Pharyngeal- Regular WFL Pharyngeal -- Pharyngeal- Multi-consistency -- Pharyngeal -- Pharyngeal- Pill WFL Pharyngeal -- Pharyngeal Comment --  CHL IP CERVICAL ESOPHAGEAL PHASE 05/25/2020 Cervical Esophageal Phase WFL Pudding Teaspoon -- Pudding Cup -- Honey Teaspoon -- Honey Cup -- Nectar Teaspoon -- Nectar Cup -- Nectar Straw -- Thin Teaspoon -- Thin Cup -- Thin Straw -- Puree -- Mechanical Soft -- Regular -- Multi-consistency -- Pill -- Cervical Esophageal Comment -- Note populated for Lebron Conners, Student SLP Osie Bond., M.A. [place]  [facility] Pager [phone] Office [phone] 05/25/2020, 10:28 AM              Medications: Infusions: . sodium chloride 250 mL (05/22/20 0847)    Scheduled Medications: . acidophilus  1 capsule Oral Daily  . aspirin EC  81 mg Oral Daily  . atorvastatin  80 mg Oral Daily  . Chlorhexidine Gluconate Cloth  6 each Topical Q0600  . cinacalcet  60 mg Oral Q M,W,F  . darbepoetin (ARANESP) injection - DIALYSIS  200 mcg Intravenous Q Wed-HD  . doxercalciferol  2 mcg Intravenous Q M,W,F-HD  . famotidine  20 mg Oral Q M,W,F  . feeding supplement (NEPRO CARB STEADY)  237 mL Oral BID BM  . glipiZIDE  2.5 mg Oral QAC breakfast  . insulin aspart  0-9 Units Subcutaneous TID WC  . insulin aspart  5 Units Subcutaneous TID WC  . methylPREDNISolone (SOLU-MEDROL) injection  60 mg Intravenous Q24H  . metoprolol succinate  12.5 mg Oral Daily  . mometasone-formoterol  2 puff Inhalation BID  . multivitamin  1 tablet Oral QHS  . ticagrelor  90 mg Oral BID  . umeclidinium bromide  1 puff Inhalation Daily    have reviewed scheduled and prn medications.  Physical Exam: General: alert, pleasant, NAD-  occ cough Heart: RRR Lungs:  better air movement Abdomen: soft, non tender Extremities: really no peripheral edema Dialysis Access: right AVF-  patent    05/26/2020,2:01 PM  LOS: 7 days

## 2020-05-27 ENCOUNTER — Inpatient Hospital Stay (HOSPITAL_COMMUNITY): Payer: Medicare Other

## 2020-05-27 DIAGNOSIS — J189 Pneumonia, unspecified organism: Secondary | ICD-10-CM | POA: Diagnosis not present

## 2020-05-27 DIAGNOSIS — A419 Sepsis, unspecified organism: Secondary | ICD-10-CM | POA: Diagnosis not present

## 2020-05-27 LAB — CBC WITH DIFFERENTIAL/PLATELET
Abs Immature Granulocytes: 0 10*3/uL (ref 0.00–0.07)
Basophils Absolute: 0 10*3/uL (ref 0.0–0.1)
Basophils Relative: 0 %
Eosinophils Absolute: 0 10*3/uL (ref 0.0–0.5)
Eosinophils Relative: 0 %
HCT: 25.5 % — ABNORMAL LOW (ref 39.0–52.0)
Hemoglobin: 8.1 g/dL — ABNORMAL LOW (ref 13.0–17.0)
Lymphocytes Relative: 10 %
Lymphs Abs: 0.7 10*3/uL (ref 0.7–4.0)
MCH: 30.1 pg (ref 26.0–34.0)
MCHC: 31.8 g/dL (ref 30.0–36.0)
MCV: 94.8 fL (ref 80.0–100.0)
Monocytes Absolute: 0 10*3/uL — ABNORMAL LOW (ref 0.1–1.0)
Monocytes Relative: 0 %
Neutro Abs: 6.6 10*3/uL (ref 1.7–7.7)
Neutrophils Relative %: 90 %
Platelets: 237 10*3/uL (ref 150–400)
RBC: 2.69 MIL/uL — ABNORMAL LOW (ref 4.22–5.81)
RDW: 20.1 % — ABNORMAL HIGH (ref 11.5–15.5)
WBC: 7.3 10*3/uL (ref 4.0–10.5)
nRBC: 0 % (ref 0.0–0.2)
nRBC: 0 /100 WBC

## 2020-05-27 LAB — COMPREHENSIVE METABOLIC PANEL
ALT: 24 U/L (ref 0–44)
ALT: 25 U/L (ref 0–44)
AST: 25 U/L (ref 15–41)
AST: 28 U/L (ref 15–41)
Albumin: 2.5 g/dL — ABNORMAL LOW (ref 3.5–5.0)
Albumin: 2.3 g/dL — ABNORMAL LOW (ref 3.5–5.0)
Alkaline Phosphatase: 100 U/L (ref 38–126)
Alkaline Phosphatase: 106 U/L (ref 38–126)
Anion gap: 14 (ref 5–15)
Anion gap: 12 (ref 5–15)
BUN: 55 mg/dL — ABNORMAL HIGH (ref 8–23)
BUN: 31 mg/dL — ABNORMAL HIGH (ref 8–23)
CO2: 22 mmol/L (ref 22–32)
CO2: 25 mmol/L (ref 22–32)
Calcium: 8.9 mg/dL (ref 8.9–10.3)
Calcium: 8.6 mg/dL — ABNORMAL LOW (ref 8.9–10.3)
Chloride: 98 mmol/L (ref 98–111)
Chloride: 96 mmol/L — ABNORMAL LOW (ref 98–111)
Creatinine, Ser: 6.06 mg/dL — ABNORMAL HIGH (ref 0.61–1.24)
Creatinine, Ser: 4.24 mg/dL — ABNORMAL HIGH (ref 0.61–1.24)
GFR, Estimated: 10 mL/min — ABNORMAL LOW (ref >60)
GFR, Estimated: 15 mL/min — ABNORMAL LOW (ref >60)
Glucose, Bld: 337 mg/dL — ABNORMAL HIGH (ref 70–99)
Glucose, Bld: 318 mg/dL — ABNORMAL HIGH (ref 70–99)
Potassium: 4.3 mmol/L (ref 3.5–5.1)
Potassium: 3.5 mmol/L (ref 3.5–5.1)
Sodium: 134 mmol/L — ABNORMAL LOW (ref 135–145)
Sodium: 133 mmol/L — ABNORMAL LOW (ref 135–145)
Total Bilirubin: 1.1 mg/dL (ref 0.3–1.2)
Total Bilirubin: 1.3 mg/dL — ABNORMAL HIGH (ref 0.3–1.2)
Total Protein: 6.9 g/dL (ref 6.5–8.1)
Total Protein: 7.3 g/dL (ref 6.5–8.1)

## 2020-05-27 LAB — GLUCOSE, CAPILLARY
Glucose-Capillary: 245 mg/dL — ABNORMAL HIGH (ref 70–99)
Glucose-Capillary: 265 mg/dL — ABNORMAL HIGH (ref 70–99)
Glucose-Capillary: 162 mg/dL — ABNORMAL HIGH (ref 70–99)
Glucose-Capillary: 159 mg/dL — ABNORMAL HIGH (ref 70–99)

## 2020-05-27 MED ORDER — DARBEPOETIN ALFA 200 MCG/0.4ML IJ SOSY
PREFILLED_SYRINGE | INTRAMUSCULAR | Status: AC
  Filled 2020-05-27: qty 0.4

## 2020-05-27 MED ORDER — DOXERCALCIFEROL 4 MCG/2ML IV SOLN
INTRAVENOUS | Status: AC
  Filled 2020-05-27: qty 2

## 2020-05-27 MED ORDER — PREDNISONE 20 MG PO TABS
40.0000 mg | ORAL_TABLET | Freq: Every day | ORAL | Status: DC
  Administered 2020-05-27: 40 mg via ORAL
  Filled 2020-05-27: qty 2

## 2020-05-27 MED ORDER — INSULIN ASPART 100 UNIT/ML ~~LOC~~ SOLN
12.0000 [IU] | Freq: Three times a day (TID) | SUBCUTANEOUS | Status: DC
  Administered 2020-05-27: 12 [IU] via SUBCUTANEOUS

## 2020-05-27 MED ORDER — METHYLPREDNISOLONE SODIUM SUCC 125 MG IJ SOLR: 60.0000 mg | Freq: Two times a day (BID) | INTRAMUSCULAR | Status: DC

## 2020-05-27 MED ORDER — DIPHENHYDRAMINE HCL 25 MG PO CAPS
25.0000 mg | ORAL_CAPSULE | Freq: Four times a day (QID) | ORAL | Status: DC | PRN
  Administered 2020-05-27 – 2020-05-29 (×3): 25 mg via ORAL
  Filled 2020-05-27 (×3): qty 1

## 2020-05-27 MED ORDER — PREDNISONE 20 MG PO TABS
40.0000 mg | ORAL_TABLET | Freq: Every day | ORAL | Status: DC
  Administered 2020-05-28 – 2020-05-30 (×3): 40 mg via ORAL
  Filled 2020-05-27 (×2): qty 2

## 2020-05-27 NOTE — Progress Notes (Signed)
Subjective:  Pt seen in room, no new c/o   Objective Vital signs in last 24 hours: Vitals:   05/27/20 0400 05/27/20 0846 05/27/20 0859 05/27/20 1100  BP: (!) 148/62 (!) 125/45  (!) 144/37  Pulse: 91 89  89  Resp: '19 15  17  '$ Temp:  97.6 F (36.4 C)  97.7 F (36.5 C)  TempSrc:  Oral  Oral  SpO2:  93% 92%   Weight:      Height:       Weight change: -5.4 kg  Intake/Output Summary (Last 24 hours) at 05/27/2020 1152 Last data filed at 05/27/2020 0800 Gross per 24 hour  Intake 240 ml  Output 301 ml  Net -61 ml   OP HD: MWF [facility] [place]  3.5h  61.5kg  Hep none  LUA AVG  sensipar 60 q HD  hectorol 2 q tx  Last hgb 6.9 on 2/21  Assessment/Plan: 65 year old BM with ESRD and COPD-  Recent hosp for PNA did not recover -  Very anemic and with pleural effusion 1. SOB/ pneumonia: hx of COPD. Here w/ PNA, improved on IV abx. Aslo getting po prednisone per CCM.  2. ESRD: MWF HD.  HD today.  3. Hypertension/volume: BP's good, under dry wt 3-4 kg, euvolemic on exam. Minimal UF w/ HD today. Getting low dose metoprolol xl 12.5 qd.  4. Anemia ckd: anemia seemed out of proportion to just ESRD.  Got prbc's last week. Giving IV iron and high dose ESA to assist as well. Hb 8.5 now.  5. Metabolic Bone Disease: We will continue his home doses of Hectorol, Sensipar - hold phoslo for now as he does not seem to be eating much and phos is OK/low 6. DM-  Per primary  7. Dispo -  Per primary MD.   Kelly Splinter, MD 05/27/2020, 11:52 AM    Labs: Basic Metabolic Panel: Recent Labs  Lab 05/22/20 1417 05/23/20 0109 05/24/20 0142 05/25/20 0048 05/26/20 0040 05/27/20 0307  NA 132* 132* 135 131* 133* 134*  K 3.6 3.8 3.5 3.6 3.5 4.3  CL 96* 97* 99 96* 96* 98  CO2 21* 24 22 20* 25 22  GLUCOSE 325* 303* 246* 242* 318* 337*  BUN 51* 28* 44* 59* 31* 55*  CREATININE 6.75* 3.82* 5.50* 6.88* 4.24* 6.06*  CALCIUM 8.0* 7.8* 8.3* 8.6* 8.6* 8.9  PHOS 2.8 1.7* 1.6*  --   --   --    Liver Function  Tests: Recent Labs  Lab 05/25/20 0048 05/26/20 0040 05/27/20 0307  AST '25 28 25  '$ ALT '26 25 24  '$ ALKPHOS 107 106 100  BILITOT 1.4* 1.3* 1.1  PROT 6.9 7.3 6.9  ALBUMIN 2.3* 2.3* 2.5*   No results for input(s): LIPASE, AMYLASE in the last 168 hours. No results for input(s): AMMONIA in the last 168 hours. CBC: Recent Labs  Lab 05/22/20 1417 05/24/20 0142 05/25/20 0048 05/26/20 0040 05/27/20 0307  WBC 7.8 7.2 8.9 6.7 7.3  NEUTROABS  --  5.9 7.1 6.4 6.6  HGB 8.0* 8.4* 7.9* 8.5* 8.1*  HCT 24.2* 26.8* 25.5* 27.6* 25.5*  MCV 90.0 94.7 95.1 95.5 94.8  PLT 204 210 202 220 237   Cardiac Enzymes: No results for input(s): CKTOTAL, CKMB, CKMBINDEX, TROPONINI in the last 168 hours. CBG: Recent Labs  Lab 05/26/20 1104 05/26/20 1539 05/26/20 2130 05/27/20 0613 05/27/20 1129  GLUCAP 294* 290* 329* 245* 265*    Iron Studies:  No results for input(s): IRON, TIBC, TRANSFERRIN, FERRITIN  in the last 72 hours. Studies/Results: No results found. Medications: Infusions: . sodium chloride 250 mL (05/22/20 0847)    Scheduled Medications: . acidophilus  1 capsule Oral Daily  . aspirin EC  81 mg Oral Daily  . atorvastatin  80 mg Oral Daily  . Chlorhexidine Gluconate Cloth  6 each Topical Q0600  . cinacalcet  60 mg Oral Q M,W,F  . darbepoetin (ARANESP) injection - DIALYSIS  200 mcg Intravenous Q Wed-HD  . doxercalciferol  2 mcg Intravenous Q M,W,F-HD  . famotidine  20 mg Oral Q M,W,F  . feeding supplement (NEPRO CARB STEADY)  237 mL Oral BID BM  . glipiZIDE  5 mg Oral QAC breakfast  . insulin aspart  0-9 Units Subcutaneous TID WC  . insulin aspart  12 Units Subcutaneous TID WC  . metoprolol succinate  12.5 mg Oral Daily  . mometasone-formoterol  2 puff Inhalation BID  . multivitamin  1 tablet Oral QHS  . predniSONE  40 mg Oral Daily  . ticagrelor  90 mg Oral BID  . umeclidinium bromide  1 puff Inhalation Daily    have reviewed scheduled and prn medications.  Physical  Exam: General: alert, pleasant, NAD-  occ cough Heart: RRR Lungs:  better air movement Abdomen: soft, non tender Extremities: really no peripheral edema Dialysis Access: right AVF-  patent    05/27/2020,11:52 AM  LOS: 8 days

## 2020-05-27 NOTE — Progress Notes (Signed)
PROGRESS NOTE   Brandon Chaney  [id number] DOB: Jul 01, 1955 DOA: 05/19/2020 PCP: Patient, No Pcp Per  Brief Narrative:  65 year old BM ESRD on HD-in [place]-proximal right upper extremity graft Dry weight is 61 kg COPD-mod CAD + stents 02/2020 EF dropped from 45% to 25% recently Herpes zoster ophthalmicus admitted from 1/16- 1/22 and 1/26 through 2/11: EF of 20 to 25% down from 45 to 50%, anemia and pneumonia.  High risk candidate per cardiology-medical management pursued-- was also seen by ID for Citrobacter koseri that grew on his culture after he underwent bronchoscopy.  He was treated with ceftriaxone and oral cefdinir. He also went home on O2. -P/C- ANCA negative, ANA negative dsDNA negative anti-CCP negative rheumatoid marginally positive less likely autoimmune and sent home on cefdinir 2/14 -Quantiferon negative ? 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 Bolused 500 cc IV fluid in ED  Transferred to [facility] found to be febrile 102 CXR infiltrates White count 11 lactic acid 2 procalcitonin 1.4 CRP 49 INR 1.3 transferred to [facility] long after receiving LR ASA Rocephin vancomycin Tylenol  On admission to [facility] showed BUN/creatinine 33/4.1 Potassium 3.1 Iron 34 saturation was 21 ferritin above 7500 Procalcitonin 23  Since admit 3/1 admission- transferred from [facility] to [facility] for maintenance HD,  1.3 L fluid removal ongoing hemoptysis therefore pulmonary was consulted see below Underwent received 1 unit PRBC 3/1, underwent IR thoracentesis 3/2, BAL repeat 3/4 Currently adjusting volumes with HD and hopeful to challenge the same-more short of breath 3/9  Hospital-Problem based course  Dyspnea from a combination of volume overload-high-output heart failure  from anemia ?  Right-sided pneumonia?  Interstitial lung disease Recent Citrobacter -I mo Abx end-date at the was 05/04/2020 Antibiotics d/c 3/7 [5 days iv] as per pulmonology--BAL culture from 3/4 NGTD-May need outpatient repeat? CT chest 3/4 =?  Right-sided pneumonia ddx autoimmune cause pulmonology started Solu-Medrol->prednisone 40 on 3/9 SLP 3/6 = mild aspiration risk only-MBS performed 3/7 cleared for regular diet tolerating well Pleural effusion 1 view IR thoracentesis performed 3/2-with marked improvement Hemoptysis Underwent bronchoscopy at [place] fear BAL culture = Citrobacter negative for malignancy, negative for autoimmune disease-etiology felt to be?  DOAC Repeat BAL performed here 3/4 as above ?  Repeat after steroids complete in outpatient setting-per pulmonary Acute anemia likely combination of renal disease, unclear blood loss  Hemoccult stool Given 1/2 units of blood on 3/1-hemoglobin ranging 7-8 Management as per nephrology with iron, aranesp ESRD MWF right upper extremity [place] Dry weight 61, currently 54.7  Patient below reported EDW per nephrology-only 1 liter off today  CAD status post stent 02/2020 drop in EF from 45 to 25% Medical treatment is very high risk for bleeding for left heart cath Resumed aspirin Brilinta and Lipitor and cut back Toprol to 12.5 Hold lisinopril Chronic pain Home meds Percocet 1 tab every 4 as needed Uncontrolled DM TY 2 secondary to steroid  Glipizide started 3/6 CBG improved  Continue 12 units 3 times daily COPD Herpes ophthalmicus  DVT prophylaxis: scd Code Status: FULL Family Communication: wife fully updated at the bedside and discussed with her on 3/6 and 3/7, 3/8-phone #[phone] Disposition:  Status is: Inpatient  Remains inpatient appropriate because:Hemodynamically unstable, Altered mental status, Ongoing diagnostic testing needed not appropriate for outpatient work up,  Unsafe d/c plan, IV treatments appropriate  due to intensity of illness or inability to take PO and Inpatient level of care appropriate due to severity of illness   Dispo: The patient is from: Home              Anticipated d/c is to: tbd              Patient currently is not medically stable to d/c.   Difficult to place patient No  Consultants:   Nephrology   IR  Cardiology  Pulmonology  Procedures: IR thoracentesis  Bronchoalveolar lavage on 3/4 Impression:            - Hemoptysis with abnormal CXR                        - The airway examination was normal.                        - Bronchoalveolar lavage was performed.                        - The airway examination was normal. Antimicrobials: Vanc Cefepime    Subjective:  Patient assessed on HD unit feeling more short of breath today compared to yesterday tells me he has not slept Eating drinking Seems weaker than yesterday He is not using a whole lot more oxygen no chest pain  Seems happy  Objective: Vitals:   05/27/20 1530 05/27/20 1552 05/27/20 1608 05/27/20 1800  BP: (!) 155/33 (!) 138/27 (!) 128/33   Pulse: 75 74 77   Resp: (!) 9 (!) 9 14   Temp:   98.1 F (36.7 C)   TempSrc:   Oral   SpO2: 98% 94% 96%   Weight:   57.2 kg 57.4 kg  Height:        Intake/Output Summary (Last 24 hours) at 05/27/2020 1817 Last data filed at 05/27/2020 1608 Gross per 24 hour  Intake 480 ml  Output 1286 ml  Net -806 ml   Filed Weights   05/27/20 1210 05/27/20 1608 05/27/20 1800  Weight: 58.2 kg 57.2 kg 57.4 kg    Examination:  Coherent  CTA B slight crackles posterolaterally bases Abdomen soft no rebound no guarding Moving all 4 limbs equally without deficits Transmetatarsal not examined today Neurologically otherwise is intact  Data Reviewed: personally reviewed   CBC    Component Value Date/Time   WBC 7.3 05/27/2020 0307   RBC 2.69 (L) 05/27/2020 0307   HGB 8.1 (L) 05/27/2020 0307   HCT 25.5 (L) 05/27/2020 0307   PLT 237 05/27/2020 0307   MCV  94.8 05/27/2020 0307   MCH 30.1 05/27/2020 0307   MCHC 31.8 05/27/2020 0307   RDW 20.1 (H) 05/27/2020 0307   LYMPHSABS 0.7 05/27/2020 0307   MONOABS 0.0 (L) 05/27/2020 0307   EOSABS 0.0 05/27/2020 0307   BASOSABS 0.0 05/27/2020 0307   CMP Latest Ref Rng & Units 05/27/2020 05/26/2020 05/25/2020  Glucose 70 - 99 mg/dL 337(H) 318(H) 242(H)  BUN 8 - 23 mg/dL 55(H) 31(H) 59(H)  Creatinine 0.61 - 1.24 mg/dL 6.06(H) 4.24(H) 6.88(H)  Sodium 135 - 145 mmol/L 134(L) 133(L) 131(L)  Potassium 3.5 - 5.1 mmol/L 4.3 3.5 3.6  Chloride 98 - 111 mmol/L 98 96(L) 96(L)  CO2 22 - 32 mmol/L 22 25 20(L)  Calcium 8.9 - 10.3 mg/dL 8.9 8.6(L) 8.6(L)  Total Protein 6.5 - 8.1  g/dL 6.9 7.3 6.9  Total Bilirubin 0.3 - 1.2 mg/dL 1.1 1.3(H) 1.4(H)  Alkaline Phos 38 - 126 U/L 100 106 107  AST 15 - 41 U/L _0 ALT 0 - 44 U/L _1 Radiology Studies: DG CHEST PORT 1 VIEW  Result Date: 05/27/2020 CLINICAL DATA:  Short of breath.  Dialysis patient. EXAM: PORTABLE CHEST 1 VIEW COMPARISON:  05/20/2020 FINDINGS: Diffuse bilateral airspace disease consistent with edema with interval improvement. Small pleural effusions bilaterally. Improvement on the left. No change on the right. Heart size upper normal. IMPRESSION: Improvement in pulmonary edema. Small bilateral pleural effusions, with improvement on the left. Electronically Signed   By: Franchot Gallo M.D.   On: 05/27/2020 17:57     Scheduled Meds: . acidophilus  1 capsule Oral Daily  . aspirin EC  81 mg Oral Daily  . atorvastatin  80 mg Oral Daily  . Chlorhexidine Gluconate Cloth  6 each Topical Q0600  . cinacalcet  60 mg Oral Q M,W,F  . Darbepoetin Alfa      . darbepoetin (ARANESP) injection - DIALYSIS  200 mcg Intravenous Q Wed-HD  . doxercalciferol      . doxercalciferol  2 mcg Intravenous Q M,W,F-HD  . famotidine  20 mg Oral Q M,W,F  . feeding supplement (NEPRO CARB STEADY)  237 mL Oral BID BM  . glipiZIDE  5 mg Oral QAC breakfast  . insulin aspart   0-9 Units Subcutaneous TID WC  . insulin aspart  12 Units Subcutaneous TID WC  . metoprolol succinate  12.5 mg Oral Daily  . mometasone-formoterol  2 puff Inhalation BID  . multivitamin  1 tablet Oral QHS  . [START ON 05/28/2020] predniSONE  40 mg Oral QAC breakfast  . ticagrelor  90 mg Oral BID  . umeclidinium bromide  1 puff Inhalation Daily   Continuous Infusions: . sodium chloride 250 mL (05/22/20 0847)     LOS: 8 days   Time spent: 35 minutes  Nita Sells, MD [facility] To contact the attending provider between 7A-7P or the covering provider during after hours 7P-7A, please log into the web site [email or web address] and access using universal [place] password for that web site. If you do not have the password, please call the hospital operator.  05/27/2020, 6:17 PM

## 2020-05-27 NOTE — Progress Notes (Signed)
Inpatient Diabetes Program Recommendations  AACE/ADA: New Consensus Statement on Inpatient Glycemic Control   Target Ranges:  Prepandial:   less than 140 mg/dL      Peak postprandial:   less than 180 mg/dL (1-2 hours)      Critically ill patients:  140 - 180 mg/dL   Results for Brandon Chaney, Brandon Chaney (MRN [id number]) as of 05/27/2020 08:27  Ref. Range 05/26/2020 05:59 05/26/2020 11:04 05/26/2020 15:39 05/26/2020 21:30 05/27/2020 06:13  Glucose-Capillary Latest Ref Range: 70 - 99 mg/dL 346 (H)  Glipizide 2.5 mg  294 (H) 290 (H) 329 (H) 245 (H)  Glipizide 5 mg  Results for Brandon Chaney, Brandon Chaney (MRN [id number]) as of 05/27/2020 08:27  Ref. Range 05/25/2020 17:02  Hemoglobin A1C Latest Ref Range: 4.8 - 5.6 % 5.6   Review of Glycemic Control  Current orders for Inpatient glycemic control: Novolog 0-9 units TID with meals, Novolog 12 units TID with meals, Glipizide 5 mg QAM; Prednisone 40 mg daily  Inpatient Diabetes Program Recommendations:    Insulin: In reviewing chart, noted that patient has been refusing insulin so no insulin given since admitted. Noted meal coverage insulin increased from 3 units TID to 12 units TID, Glipizide increased from 2.5 mg to 5 mg, and steroids decreased from Solumedrol to Prednisone. Would recommend to discontinue Novolog meal coverage and see how glucose trends with other changes.  HbgA1C: A1C 5.6% on 05/25/20 indicating an average glucose of 114 mg/dl over the past 2-3 months. Hemoglobin was 7.9 g/dL on 05/25/20 when A1C was drawn; anticipate A1C results are not completely accurate due to low hemoglobin.   Thanks, Barnie Alderman, RN, MSN, CDE Diabetes Coordinator Inpatient Diabetes Program [phone] (Team Pager from 8am to 5pm)

## 2020-05-27 NOTE — Progress Notes (Signed)
NAME:  Brandon Chaney, MRN:  [id number], DOB:  02-01-1956, LOS: 8 ADMISSION DATE:  05/19/2020, CONSULTATION DATE:  05/21/20 REFERRING MD:  Maren Beach - TRH, CHIEF COMPLAINT:  Hemoptysis   Brief History:  65 yo persistent small volume hemoptysis since December 2021. Repeatedly tx for PNA -- recent admit to [facility] for citrobacter koseri   History of Present Illness:  65 yo M PMH HFrEF, ESRD, COPD, CAD, anemia who presented to [facility] ED 3/122 with chest pain SOB cough and hemoptysis. Recent hospitalization for PNA (citrobacater koseri) and symptomatic anemia requiring transfusion and was discharged on O2. Symptoms have not improved since discharge, and pt endorses small volume hemoptysis since December 2021.  In ED, CXR revealed bilateral upper lobe opacities, pt started on rocephin and vanc. Admitted to [facility] to [facility]. Transferred to [facility] due to iHD need. Underwent Thora with IR 3/2 with 1.3L removed.  PCCM consulted 3/3 for hemoptysis evaluation   Past Medical History:  HFrEF  ESRD COPD CAD Zoster Ophthalmicus  PNA Anemia  Rheumatoid Arthritis  Significant Hospital Events:  3/1 admitted to [facility] 3/2 transfer to [facility] [place] with IR 3/3 CCM consult for persistent hemoptysis   Consults:  IR CCM  Procedures:  3/2 R Thora> 1.3L off   Significant Diagnostic Tests:  3/1 NM pulm perfusion> small nonsegmetal defects, felt to be r/t PNA not PE 3/2 CXR> Bilateral infiltrates and edema.   Micro Data:  3/1 sputum> rare WBC, few squamous cells, no orgs  3/1 covid negative, flu a/b negative   Antimicrobials:  Vanc  Cefepime  Interim History / Subjective:  No acute events overnight.   Reports cough is improved since starting steroids and he is coughing up a bit less blood. He is feeling even better today.   Objective   Blood pressure (!) 125/45, pulse 89, temperature 97.6 F (36.4 C), temperature source Oral, resp. rate 15, height 5' 9" (1.753 m), weight 58.4 kg, SpO2  92 %.        Intake/Output Summary (Last 24 hours) at 05/27/2020 1127 Last data filed at 05/27/2020 0800 Gross per 24 hour  Intake 240 ml  Output 301 ml  Net -61 ml   Filed Weights   05/25/20 1554 05/26/20 0418 05/27/20 0344  Weight: 60.5 kg 57.8 kg 58.4 kg    Examination: General: elderly male, no acute distress, sitting up in chair HENT: [place]/AT, PERRL, sclera anicteric, moist mucous membranes Lungs: no wheezing or rhonchi. Right basilar crackles Cardiovascular: rrr cap refill < 3 sec  Abdomen: soft  thin + bowel sounds  Extremities: L AVG. No acute deformity. Lower extremity s/p metatarsal amputation. No peripheral edema Neuro: AAOx4 following commands  Resolved Hospital Problem list     Assessment & Plan:    Acute hypoxemic respiratory failure  Hemoptysis with hemosiderin laden macrophages on BAL Recent multifocal pna  COPD ESRD  CAD s/p PCI  HFrEF   Patient's respiratory failure has improved as he is off supplemental oxygen at this time. The BAL on the bronchoscopy has hemosiderin laden macrophages concerning for pulmonary capillaritis or diffuse alveolar hemorrhage which is in the setting of recent pneumonia and dual antiplatelet therapy for the coronary stents.  ESR and CRP elevated on 3/7 with procalcitonin down from admission.   He received solumedrol 43m daily on 3/7-3/8. Will reduce his steroids to 470mof prednisone daily today. He appears to be receiving benefit from the steroids and if this remains true through tomorrow will plan for prolonged  steroid taper and setup follow up in our clinic.   The right pleural effusion appears transudative in nature based on total protein ratio. Likely in setting of heart failure and ESRD he has bilateral effusions.  Continue incruse and dulera for his COPD.   Labs   CBC: Recent Labs  Lab 05/21/20 2226 05/22/20 1417 05/24/20 0142 05/25/20 0048 05/26/20 0040 05/27/20 0307  WBC 7.6 7.8 7.2 8.9 6.7 7.3  NEUTROABS 6.1   --  5.9 7.1 6.4 6.6  HGB 8.6* 8.0* 8.4* 7.9* 8.5* 8.1*  HCT 28.3* 24.2* 26.8* 25.5* 27.6* 25.5*  MCV 95.0 90.0 94.7 95.1 95.5 94.8  PLT 223 204 210 202 220 387    Basic Metabolic Panel: Recent Labs  Lab 05/21/20 0120 05/21/20 2226 05/22/20 1417 05/23/20 0109 05/24/20 0142 05/25/20 0048 05/26/20 0040 05/27/20 0307  NA 136 131* 132* 132* 135 131* 133* 134*  K 3.2* 3.3* 3.6 3.8 3.5 3.6 3.5 4.3  CL 99 93* 96* 97* 99 96* 96* 98  CO2 24 24 21* 24 22 20* 25 22  GLUCOSE 192* 273* 325* 303* 246* 242* 318* 337*  BUN 28* 43* 51* 28* 44* 59* 31* 55*  CREATININE 4.37* 5.70* 6.75* 3.82* 5.50* 6.88* 4.24* 6.06*  CALCIUM 8.0* 8.2* 8.0* 7.8* 8.3* 8.6* 8.6* 8.9  MG 1.9 2.0  --  1.8 1.8  --   --   --   PHOS 2.7 2.0* 2.8 1.7* 1.6*  --   --   --    GFR: Estimated Creatinine Clearance: 10 mL/min (A) (by C-G formula based on SCr of 6.06 mg/dL (H)). Recent Labs  Lab 05/24/20 0142 05/25/20 0048 05/25/20 1154 05/26/20 0040 05/27/20 0307  PROCALCITON  --   --  5.95  --   --   WBC 7.2 8.9  --  6.7 7.3    Liver Function Tests: Recent Labs  Lab 05/22/20 1417 05/25/20 0048 05/26/20 0040 05/27/20 0307  AST  --  _0 ALT  --  _1 ALKPHOS  --  107 106 100  BILITOT  --  1.4* 1.3* 1.1  PROT  --  6.9 7.3 6.9  ALBUMIN 2.2* 2.3* 2.3* 2.5*   No results for input(s): LIPASE, AMYLASE in the last 168 hours. No results for input(s): AMMONIA in the last 168 hours.  ABG No results found for: PHART, PCO2ART, PO2ART, HCO3, TCO2, ACIDBASEDEF, O2SAT   Coagulation Profile: No results for input(s): INR, PROTIME in the last 168 hours.  Cardiac Enzymes: No results for input(s): CKTOTAL, CKMB, CKMBINDEX, TROPONINI in the last 168 hours.  HbA1C: Hgb A1c MFr Bld  Date/Time Value Ref Range Status  05/25/2020 05:02 PM 5.6 4.8 - 5.6 % Final    Comment:    (NOTE) Pre diabetes:          5.7%-6.4%  Diabetes:              >6.4%  Glycemic control for   <7.0% adults with diabetes      CBG: Recent Labs  Lab 05/26/20 0559 05/26/20 1104 05/26/20 1539 05/26/20 2130 05/27/20 0613  GLUCAP 346* 294* [place], MD [facility] Office: [phone]   See Amion for Pager Details

## 2020-05-28 ENCOUNTER — Telehealth: Payer: Self-pay | Admitting: Pulmonary Disease

## 2020-05-28 DIAGNOSIS — J189 Pneumonia, unspecified organism: Secondary | ICD-10-CM | POA: Diagnosis not present

## 2020-05-28 DIAGNOSIS — A419 Sepsis, unspecified organism: Secondary | ICD-10-CM | POA: Diagnosis not present

## 2020-05-28 LAB — CBC WITH DIFFERENTIAL/PLATELET
Abs Immature Granulocytes: 0.04 10*3/uL (ref 0.00–0.07)
Basophils Absolute: 0 10*3/uL (ref 0.0–0.1)
Basophils Relative: 0 %
Eosinophils Absolute: 0 10*3/uL (ref 0.0–0.5)
Eosinophils Relative: 0 %
HCT: 26.7 % — ABNORMAL LOW (ref 39.0–52.0)
Hemoglobin: 8.5 g/dL — ABNORMAL LOW (ref 13.0–17.0)
Immature Granulocytes: 1 %
Lymphocytes Relative: 13 %
Lymphs Abs: 1 10*3/uL (ref 0.7–4.0)
MCH: 29.9 pg (ref 26.0–34.0)
MCHC: 31.8 g/dL (ref 30.0–36.0)
MCV: 94 fL (ref 80.0–100.0)
Monocytes Absolute: 0.3 10*3/uL (ref 0.1–1.0)
Monocytes Relative: 4 %
Neutro Abs: 6.4 10*3/uL (ref 1.7–7.7)
Neutrophils Relative %: 82 %
Platelets: 220 10*3/uL (ref 150–400)
RBC: 2.84 MIL/uL — ABNORMAL LOW (ref 4.22–5.81)
RDW: 20.4 % — ABNORMAL HIGH (ref 11.5–15.5)
WBC: 7.8 10*3/uL (ref 4.0–10.5)
nRBC: 0.3 % — ABNORMAL HIGH (ref 0.0–0.2)

## 2020-05-28 LAB — RENAL FUNCTION PANEL
Albumin: 2.5 g/dL — ABNORMAL LOW (ref 3.5–5.0)
Anion gap: 11 (ref 5–15)
BUN: 27 mg/dL — ABNORMAL HIGH (ref 8–23)
CO2: 28 mmol/L (ref 22–32)
Calcium: 8.1 mg/dL — ABNORMAL LOW (ref 8.9–10.3)
Chloride: 96 mmol/L — ABNORMAL LOW (ref 98–111)
Creatinine, Ser: 3.82 mg/dL — ABNORMAL HIGH (ref 0.61–1.24)
GFR, Estimated: 17 mL/min — ABNORMAL LOW (ref >60)
Glucose, Bld: 194 mg/dL — ABNORMAL HIGH (ref 70–99)
Phosphorus: 2.6 mg/dL (ref 2.5–4.6)
Potassium: 3.4 mmol/L — ABNORMAL LOW (ref 3.5–5.1)
Sodium: 135 mmol/L (ref 135–145)

## 2020-05-28 LAB — GLUCOSE, CAPILLARY
Glucose-Capillary: 104 mg/dL — ABNORMAL HIGH (ref 70–99)
Glucose-Capillary: 221 mg/dL — ABNORMAL HIGH (ref 70–99)
Glucose-Capillary: 403 mg/dL — ABNORMAL HIGH (ref 70–99)
Glucose-Capillary: 445 mg/dL — ABNORMAL HIGH (ref 70–99)

## 2020-05-28 MED ORDER — ZOLPIDEM TARTRATE 5 MG PO TABS
5.0000 mg | ORAL_TABLET | Freq: Every day | ORAL | Status: DC
  Administered 2020-05-28 – 2020-05-29 (×2): 5 mg via ORAL
  Filled 2020-05-28 (×2): qty 1

## 2020-05-28 MED ORDER — PNEUMOCOCCAL VAC POLYVALENT 25 MCG/0.5ML IJ INJ
0.5000 mL | INJECTION | INTRAMUSCULAR | Status: AC
  Administered 2020-05-30: 0.5 mL via INTRAMUSCULAR
  Filled 2020-05-28: qty 0.5

## 2020-05-28 MED ORDER — GLIPIZIDE 5 MG PO TABS
5.0000 mg | ORAL_TABLET | ORAL | Status: AC
  Administered 2020-05-28: 5 mg via ORAL
  Filled 2020-05-28: qty 1

## 2020-05-28 NOTE — Progress Notes (Signed)
PROGRESS NOTE   Brandon Chaney  ZDG:[id number] DOB: 01-Aug-1955 DOA: 05/19/2020 PCP: Patient, No Pcp Per  Brief Narrative:  65 year old BM ESRD on HD-in [place]-proximal right upper extremity graft Dry weight is 61 kg COPD-mod CAD + stents 02/2020 EF dropped from 45% to 25% recently Herpes zoster ophthalmicus admitted from 1/16- 1/22 and 1/26 through 2/11: EF of 20 to 25% down from 45 to 50%, anemia and pneumonia.  High risk candidate per cardiology-medical management pursued-- was also seen by ID for Citrobacter koseri that grew on his culture after he underwent bronchoscopy.  He was treated with ceftriaxone and oral cefdinir. He also went home on O2. -P/C- ANCA negative, ANA negative dsDNA negative anti-CCP negative rheumatoid marginally positive less likely autoimmune and sent home on cefdinir 2/14 -Quantiferon negative ? H/o RA in H&P from [facility]  Had hemodialysis day prior to admission 2/28-post-treatment Felt very fatigued at the end of it Does not recall having endoscopy or colonoscopy at either hospital Does not report dark tarry stools bloody emesis etc. Bolused 500 cc IV fluid in ED  Transferred initially to [facility] from [facility] 05/19/2020  febrile 102 CXR infiltrates White count 11 lactic acid 2 procalcitonin 1.4 CRP 49 INR 1.3 t eceiving LR ASA Rocephin vancomycin Tylenol  On admission to [facility] showed BUN/creatinine 33/4.1 Potassium 3.1 Iron 34 saturation was 21 ferritin above 7500 Procalcitonin 23  Since admit 3/1 admission- [facility]to [facility] for maintenance HD,  1.3 L fluid removal ongoing hemoptysis therefore pulmonary was consulted see below Underwent received 1 unit PRBC 3/1, underwent IR thoracentesis 3/2, BAL repeat 3/4 Currently adjusting volumes with HD and hopeful to challenge the same-more short of breath 3/9  Hospital-Problem based course  Dyspnea from a combination of volume overload-high-output heart failure from anemia ?   Right-sided pneumonia?  Interstitial lung disease Recent Citrobacter -I mo Abx end-date at the was 05/04/2020 Antibiotics d/c 3/7 [5 days iv] as per pulmonology- -BAL culture from 3/4 NGTD-this will be coordinated in the outpatient setting to be repeated by Dr. Erin Fulling as per his note CT chest 3/4 = favour autoimmune cause pulmonology started Solu-Medrol->prednisone 40 on 3/9 SLP 3/6 = mild aspiration risk only-MBS performed 3/7 cleared for regular diet tolerating well Pleural effusion 1 view IR thoracentesis performed 3/2-with marked improvement to her breathing and oxygenation Hemoptysis Underwent bronchoscopy at [facility] BAL culture = Citrobacter negative for malignancy, negative for autoimmune disease-etiology felt to be?  DOAC Repeat BAL performed here 3/4 as above--no growth feel that this is secondary to being on dual antiplatelet therapies in the setting of cough Acute anemia likely combination of renal disease, unclear blood loss  Hemoccult stool Given 1/2 units of blood on 3/1-hemoglobin ranging 7-8 Management as per nephrology with iron, aranesp ESRD MWF right upper extremity [place] Dry weight 61, currently 57  Patient below reported EDW per nephrology-only 1 liter off today  CAD status post stent 02/2020 drop in EF from 45 to 25% Medical treatment is very high risk for bleeding for left heart cath Resumed aspirin Brilinta and Lipitor  Toprol to 12.5 Hold lisinopril on discharge Chronic pain Home meds Percocet 1 tab every 4 as needed Uncontrolled DM TY 2 secondary to steroid  Glipizide started 3/6 CBG improved-discharged on low-dose  Continue 12 units 3 times daily COPD Herpes ophthalmicus  DVT prophylaxis: scd Code Status: FULL Family Communication:  Called patient's wife and at [phone] and updated her She has to coordinate coming into town to pick  him up on Saturday as she works full-time and he should be ready to discharge at that time He will need a printed  copy of his discharge summary on 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   Difficult to place patient No  Consultants:   Nephrology   IR  Cardiology  Pulmonology  Procedures: IR thoracentesis  Bronchoalveolar lavage on 3/4 Impression:            - Hemoptysis with abnormal CXR                        - The airway examination was normal.                        - Bronchoalveolar lavage was performed.                        - The airway examination was normal. Antimicrobials: Vanc Cefepime initially but then stopped as above   Subjective:  Doing much better no shortness of breath no fever no chills Eating and drinking Asking about Benadryl for sleep-trazodone does not work so we compromised on him using 5 mg of Ambien which we can increase the dose of on discharge   Objective: Vitals:   05/28/20 0349 05/28/20 0500 05/28/20 0600 05/28/20 0743  BP: (!) 140/33   (!) 107/56  Pulse: 84     Resp: _0 Temp: 98.4 F (36.9 C)   97.8 F (36.6 C)  TempSrc: Oral   Oral  SpO2: 99%   91%  Weight: 57.6 kg     Height:        Intake/Output Summary (Last 24 hours) at 05/28/2020 1452 Last data filed at 05/28/2020 0800 Gross per 24 hour  Intake 358 ml  Output 985 ml  Net -627 ml   Filed Weights   05/27/20 1608 05/27/20 1800 05/28/20 0349  Weight: 57.2 kg 57.4 kg 57.6 kg    Examination:  Awake coherent alert no distress CTA B no rales Abdomen soft no rebound no guarding Moving all 4  limbs equally without deficits Transmetatarsal clean Moving all 4 limbs equally without deficits  Data Reviewed: personally reviewed   CBC    Component Value Date/Time   WBC 7.8 05/28/2020 0036   RBC 2.84 (L) 05/28/2020 0036   HGB 8.5 (L) 05/28/2020 0036   HCT 26.7 (L) 05/28/2020 0036   PLT 220 05/28/2020 0036   MCV 94.0 05/28/2020 0036   MCH 29.9 05/28/2020 0036   MCHC 31.8 05/28/2020 0036   RDW 20.4 (H) 05/28/2020 0036   LYMPHSABS 1.0 05/28/2020 0036   MONOABS 0.3 05/28/2020 0036   EOSABS 0.0 05/28/2020 0036   BASOSABS 0.0 05/28/2020 0036   CMP Latest Ref Rng & Units 05/28/2020 05/27/2020 05/26/2020  Glucose 70 - 99 mg/dL 194(H) 337(H) 318(H)  BUN 8 -  23 mg/dL 27(H) 55(H) 31(H)  Creatinine 0.61 - 1.24 mg/dL 3.82(H) 6.06(H) 4.24(H)  Sodium 135 - 145 mmol/L 135 134(L) 133(L)  Potassium 3.5 - 5.1 mmol/L 3.4(L) 4.3 3.5  Chloride 98 - 111 mmol/L 96(L) 98 96(L)  CO2 22 - 32 mmol/L _0 Calcium 8.9 - 10.3 mg/dL 8.1(L) 8.9 8.6(L)  Total Protein 6.5 - 8.1 g/dL - 6.9 7.3  Total Bilirubin 0.3 - 1.2 mg/dL - 1.1 1.3(H)  Alkaline Phos 38 - 126 U/L - 100 106  AST 15 - 41 U/L - 25 28  ALT 0 - 44 U/L - 24 25     Radiology Studies: DG CHEST PORT 1 VIEW  Result Date: 05/27/2020 CLINICAL DATA:  Short of breath.  Dialysis patient. EXAM: PORTABLE CHEST 1 VIEW COMPARISON:  05/20/2020 FINDINGS: Diffuse bilateral airspace disease consistent with edema with interval improvement. Small pleural effusions bilaterally. Improvement on the left. No change on the right. Heart size upper normal. IMPRESSION: Improvement in pulmonary edema. Small bilateral pleural effusions, with improvement on the left. Electronically Signed   By: Franchot Gallo M.D.   On: 05/27/2020 17:57     Scheduled Meds: . acidophilus  1 capsule Oral Daily  . aspirin EC  81 mg Oral Daily  . atorvastatin  80 mg Oral Daily  . Chlorhexidine Gluconate Cloth  6 each Topical Q0600  . cinacalcet  60 mg Oral Q M,W,F  . darbepoetin  (ARANESP) injection - DIALYSIS  200 mcg Intravenous Q Wed-HD  . doxercalciferol  2 mcg Intravenous Q M,W,F-HD  . famotidine  20 mg Oral Q M,W,F  . feeding supplement (NEPRO CARB STEADY)  237 mL Oral BID BM  . glipiZIDE  5 mg Oral QAC breakfast  . insulin aspart  0-9 Units Subcutaneous TID WC  . insulin aspart  12 Units Subcutaneous TID WC  . metoprolol succinate  12.5 mg Oral Daily  . mometasone-formoterol  2 puff Inhalation BID  . multivitamin  1 tablet Oral QHS  . predniSONE  40 mg Oral QAC breakfast  . ticagrelor  90 mg Oral BID  . umeclidinium bromide  1 puff Inhalation Daily  . zolpidem  5 mg Oral QHS   Continuous Infusions: . sodium chloride 250 mL (05/22/20 0847)     LOS: 9 days   Time spent: 35 minutes  Nita Sells, MD [facility] To contact the attending provider between 7A-7P or the covering provider during after hours 7P-7A, please log into the web site [email or web address] and access using universal [place] password for that web site. If you do not have the password, please call the hospital operator.  05/28/2020, 2:52 PM

## 2020-05-28 NOTE — Progress Notes (Signed)
Subjective:  Pt seen in room, no new c/o   Objective Vital signs in last 24 hours: Vitals:   05/28/20 0349 05/28/20 0500 05/28/20 0600 05/28/20 0743  BP: (!) 140/33   (!) 107/56  Pulse: 84     Resp: '13 20 11   '$ Temp: 98.4 F (36.9 C)   97.8 F (36.6 C)  TempSrc: Oral   Oral  SpO2: 99%   91%  Weight: 57.6 kg     Height:       Weight change: -0.2 kg  Intake/Output Summary (Last 24 hours) at 05/28/2020 1140 Last data filed at 05/28/2020 0800 Gross per 24 hour  Intake 358 ml  Output 985 ml  Net -627 ml   OP HD: MWF [facility] [place]  3.5h  61.5kg  Hep none  LUA AVG  sensipar 60 q HD  hectorol 2 q tx  Last hgb 6.9 on 2/21  Assessment/Plan: 65 year old BM with ESRD and COPD-  Recent hosp for PNA did not recover -  Very anemic and with pleural effusion 1. SOB/ pneumonia: hx of COPD. Here w/ PNA, improved on IV abx. Aslo getting po prednisone per CCM.  2. ESRD: MWF HD. Next HD Friday.  3. Hypertension/volume: BP's good, under dry wt 3-4 kg, euvolemic on exam. Minimal UF w/ HD. Getting low dose metoprolol xl 12.5 qd.  4. Anemia ckd: anemia seemed out of proportion to just ESRD.  Got prbc's last week. Giving IV iron and high dose ESA to assist as well. Hb 8.5 now.  5. Metabolic Bone Disease: We will continue his home doses of Hectorol, Sensipar - hold phoslo for now as he does not seem to be eating much and phos is OK/low 6. DM-  Per primary  7. Dispo -  Per primary MD.   Kelly Splinter, MD 05/28/2020, 11:40 AM    Labs: Basic Metabolic Panel: Recent Labs  Lab 05/23/20 0109 05/24/20 0142 05/25/20 0048 05/26/20 0040 05/27/20 0307 05/28/20 0036  NA 132* 135   < > 133* 134* 135  K 3.8 3.5   < > 3.5 4.3 3.4*  CL 97* 99   < > 96* 98 96*  CO2 24 22   < > '25 22 28  '$ GLUCOSE 303* 246*   < > 318* 337* 194*  BUN 28* 44*   < > 31* 55* 27*  CREATININE 3.82* 5.50*   < > 4.24* 6.06* 3.82*  CALCIUM 7.8* 8.3*   < > 8.6* 8.9 8.1*  PHOS 1.7* 1.6*  --   --   --  2.6   < > = values  in this interval not displayed.   Liver Function Tests: Recent Labs  Lab 05/25/20 0048 05/26/20 0040 05/27/20 0307 05/28/20 0036  AST '25 28 25  '$ --   ALT '26 25 24  '$ --   ALKPHOS 107 106 100  --   BILITOT 1.4* 1.3* 1.1  --   PROT 6.9 7.3 6.9  --   ALBUMIN 2.3* 2.3* 2.5* 2.5*   No results for input(s): LIPASE, AMYLASE in the last 168 hours. No results for input(s): AMMONIA in the last 168 hours. CBC: Recent Labs  Lab 05/24/20 0142 05/25/20 0048 05/26/20 0040 05/27/20 0307 05/28/20 0036  WBC 7.2 8.9 6.7 7.3 7.8  NEUTROABS 5.9 7.1 6.4 6.6 6.4  HGB 8.4* 7.9* 8.5* 8.1* 8.5*  HCT 26.8* 25.5* 27.6* 25.5* 26.7*  MCV 94.7 95.1 95.5 94.8 94.0  PLT 210 202 220 237 220   Cardiac  Enzymes: No results for input(s): CKTOTAL, CKMB, CKMBINDEX, TROPONINI in the last 168 hours. CBG: Recent Labs  Lab 05/27/20 1129 05/27/20 1642 05/27/20 2122 05/28/20 0617 05/28/20 1137  GLUCAP 265* 162* 159* 104* 221*    Iron Studies:  No results for input(s): IRON, TIBC, TRANSFERRIN, FERRITIN in the last 72 hours. Studies/Results: DG CHEST PORT 1 VIEW  Result Date: 05/27/2020 CLINICAL DATA:  Short of breath.  Dialysis patient. EXAM: PORTABLE CHEST 1 VIEW COMPARISON:  05/20/2020 FINDINGS: Diffuse bilateral airspace disease consistent with edema with interval improvement. Small pleural effusions bilaterally. Improvement on the left. No change on the right. Heart size upper normal. IMPRESSION: Improvement in pulmonary edema. Small bilateral pleural effusions, with improvement on the left. Electronically Signed   By: Franchot Gallo M.D.   On: 05/27/2020 17:57   Medications: Infusions: . sodium chloride 250 mL (05/22/20 0847)    Scheduled Medications: . acidophilus  1 capsule Oral Daily  . aspirin EC  81 mg Oral Daily  . atorvastatin  80 mg Oral Daily  . Chlorhexidine Gluconate Cloth  6 each Topical Q0600  . cinacalcet  60 mg Oral Q M,W,F  . darbepoetin (ARANESP) injection - DIALYSIS  200 mcg  Intravenous Q Wed-HD  . doxercalciferol  2 mcg Intravenous Q M,W,F-HD  . famotidine  20 mg Oral Q M,W,F  . feeding supplement (NEPRO CARB STEADY)  237 mL Oral BID BM  . glipiZIDE  5 mg Oral QAC breakfast  . insulin aspart  0-9 Units Subcutaneous TID WC  . insulin aspart  12 Units Subcutaneous TID WC  . metoprolol succinate  12.5 mg Oral Daily  . mometasone-formoterol  2 puff Inhalation BID  . multivitamin  1 tablet Oral QHS  . predniSONE  40 mg Oral QAC breakfast  . ticagrelor  90 mg Oral BID  . umeclidinium bromide  1 puff Inhalation Daily    have reviewed scheduled and prn medications.  Physical Exam: General: alert, pleasant, NAD-  occ cough Heart: RRR Lungs:  better air movement Abdomen: soft, non tender Extremities: really no peripheral edema Dialysis Access: right AVF-  patent    05/28/2020,11:40 AM  LOS: 9 days

## 2020-05-28 NOTE — Telephone Encounter (Signed)
Please schedule patient for hospital follow up in 4 weeks with me for hemoptysis. Let me know the date and time so I can add it to his discharge summary.  Thanks, [name]

## 2020-05-28 NOTE — Progress Notes (Signed)
Pt. Placed on auto cpap 18/5 with 3L bled in(per pt.), VSS, RT will continue to monitor

## 2020-05-28 NOTE — Progress Notes (Signed)
NAME:  Brandon Chaney, MRN:  [id number], DOB:  Jul 12, 1955, LOS: 9 ADMISSION DATE:  05/19/2020, CONSULTATION DATE:  05/21/20 REFERRING MD:  Maren Beach - TRH, CHIEF COMPLAINT:  Hemoptysis   Brief History:  65 yo persistent small volume hemoptysis since December 2021. Repeatedly tx for PNA -- recent admit to [facility] for citrobacter koseri   History of Present Illness:  65 yo M PMH HFrEF, ESRD, COPD, CAD, anemia who presented to [facility] ED 3/122 with chest pain SOB cough and hemoptysis. Recent hospitalization for PNA (citrobacater koseri) and symptomatic anemia requiring transfusion and was discharged on O2. Symptoms have not improved since discharge, and pt endorses small volume hemoptysis since December 2021.  In ED, CXR revealed bilateral upper lobe opacities, pt started on rocephin and vanc. Admitted to [facility] to [facility]. Transferred to [facility] due to iHD need. Underwent Thora with IR 3/2 with 1.3L removed.  PCCM consulted 3/3 for hemoptysis evaluation   Past Medical History:  HFrEF  ESRD COPD CAD Zoster Ophthalmicus  PNA Anemia  Rheumatoid Arthritis  Significant Hospital Events:  3/1 admitted to [facility] 3/2 transfer to [facility] with IR 3/3 CCM consult for persistent hemoptysis   Consults:  IR CCM  Procedures:  3/2 R Thora> 1.3L off   Significant Diagnostic Tests:  3/1 NM pulm perfusion> small nonsegmetal defects, felt to be r/t PNA not PE 3/2 CXR> Bilateral infiltrates and edema.   Micro Data:  3/1 sputum> rare WBC, few squamous cells, no orgs  3/1 covid negative, flu a/b negative   Antimicrobials:  Vanc  Cefepime  Interim History / Subjective:  No acute events overnight.   Had shortness of breath yesterday afternoon during/after dialysis. 1L removed with post dialysis weight of 57.4kg.   Patient reports shortness of breath is better this AM.  Objective   Blood pressure (!) 107/56, pulse 84, temperature 97.8 F (36.6 C), temperature source Oral, resp. rate  11, height _0  (1.753 m), weight 57.6 kg, SpO2 91 %.        Intake/Output Summary (Last 24 hours) at 05/28/2020 1039 Last data filed at 05/28/2020 0800 Gross per 24 hour  Intake 358 ml  Output 985 ml  Net -627 ml   Filed Weights   05/27/20 1608 05/27/20 1800 05/28/20 0349  Weight: 57.2 kg 57.4 kg 57.6 kg    Examination: General: elderly male, no acute distress, sitting up in chair HENT: [place]/AT, PERRL, sclera anicteric, moist mucous membranes Lungs: no wheezing or rhonchi. Right basilar crackles Cardiovascular: rrr cap refill < 3 sec  Abdomen: soft  thin + bowel sounds  Extremities: L AVG. No acute deformity. Lower extremity s/p metatarsal amputation. No peripheral edema Neuro: AAOx4 following commands  Resolved Hospital Problem list     Assessment & Plan:    Acute hypoxemic respiratory failure  Hemoptysis with hemosiderin laden macrophages on BAL Recent multifocal pna  COPD ESRD  CAD s/p PCI  HFrEF   Patient's respiratory failure has improved as he is off supplemental oxygen at this time. The BAL on the bronchoscopy has hemosiderin laden macrophages concerning for pulmonary capillaritis or diffuse alveolar hemorrhage which is in the setting of recent pneumonia and dual antiplatelet therapy for the coronary stents.  ESR and CRP elevated on 3/7 with procalcitonin down from admission.   He received solumedrol 67m daily on 3/7-3/8. Tapered to 443mof prednisone on 3/9. He appears to be receiving benefit from the steroids with reduction in cough and hemoptysis. Recommend he complete a  month long steroid taper: 82m through 3/15,  then 234m3/16 to 3/22,  then 1070m/23 to 3/29,  then 5mg64m30 to 4/5.   Will trial patient on nocturnal CPAP for his history of heart failure and COPD and see if he benefits from this. If he does benefit then he will need a sleep study outpatient to qualify for a cpap machine.   The right pleural effusion appears transudative in nature based  on total protein ratio. Likely in setting of heart failure and ESRD he has bilateral effusions.  Continue incruse and dulera for his COPD.   Labs   CBC: Recent Labs  Lab 05/24/20 0142 05/25/20 0048 05/26/20 0040 05/27/20 0307 05/28/20 0036  WBC 7.2 8.9 6.7 7.3 7.8  NEUTROABS 5.9 7.1 6.4 6.6 6.4  HGB 8.4* 7.9* 8.5* 8.1* 8.5*  HCT 26.8* 25.5* 27.6* 25.5* 26.7*  MCV 94.7 95.1 95.5 94.8 94.0  PLT 210 202 220 237 220 161Basic Metabolic Panel: Recent Labs  Lab 05/21/20 2226 05/22/20 1417 05/23/20 0109 05/24/20 0142 05/25/20 0048 05/26/20 0040 05/27/20 0307 05/28/20 0036  NA 131* 132* 132* 135 131* 133* 134* 135  K 3.3* 3.6 3.8 3.5 3.6 3.5 4.3 3.4*  CL 93* 96* 97* 99 96* 96* 98 96*  CO2 24 21* 24 22 20* _0 GLUCOSE 273* 325* 303* 246* 242* 318* 337* 194*  BUN 43* 51* 28* 44* 59* 31* 55* 27*  CREATININE 5.70* 6.75* 3.82* 5.50* 6.88* 4.24* 6.06* 3.82*  CALCIUM 8.2* 8.0* 7.8* 8.3* 8.6* 8.6* 8.9 8.1*  MG 2.0  --  1.8 1.8  --   --   --   --   PHOS 2.0* 2.8 1.7* 1.6*  --   --   --  2.6   GFR: Estimated Creatinine Clearance: 15.7 mL/min (A) (by C-G formula based on SCr of 3.82 mg/dL (H)). Recent Labs  Lab 05/25/20 0048 05/25/20 1154 05/26/20 0040 05/27/20 0307 05/28/20 0036  PROCALCITON  --  5.95  --   --   --   WBC 8.9  --  6.7 7.3 7.8    Liver Function Tests: Recent Labs  Lab 05/22/20 1417 05/25/20 0048 05/26/20 0040 05/27/20 0307 05/28/20 0036  AST  --  _1 --   ALT  --  _2 --   ALKPHOS  --  107 106 100  --   BILITOT  --  1.4* 1.3* 1.1  --   PROT  --  6.9 7.3 6.9  --   ALBUMIN 2.2* 2.3* 2.3* 2.5* 2.5*   No results for input(s): LIPASE, AMYLASE in the last 168 hours. No results for input(s): AMMONIA in the last 168 hours.  ABG No results found for: PHART, PCO2ART, PO2ART, HCO3, TCO2, ACIDBASEDEF, O2SAT   Coagulation Profile: No results for input(s): INR, PROTIME in the last 168 hours.  Cardiac Enzymes: No results for input(s):  CKTOTAL, CKMB, CKMBINDEX, TROPONINI in the last 168 hours.  HbA1C: Hgb A1c MFr Bld  Date/Time Value Ref Range Status  05/25/2020 05:02 PM 5.6 4.8 - 5.6 % Final    Comment:    (NOTE) Pre diabetes:          5.7%-6.4%  Diabetes:              >6.4%  Glycemic control for   <7.0% adults with diabetes     CBG: Recent Labs  Lab 05/27/20 [id number] 05/27/20 1642 05/27/20 2122 05/28/20 0617  Sentinel, MD [facility] Office: [phone]   See Amion for Pager Details

## 2020-05-28 NOTE — Telephone Encounter (Signed)
Per JD---  Schedule pt for 04/08 at 9 but pt will come in at 830 for visit per JD.

## 2020-05-29 DIAGNOSIS — A419 Sepsis, unspecified organism: Secondary | ICD-10-CM | POA: Diagnosis not present

## 2020-05-29 DIAGNOSIS — J189 Pneumonia, unspecified organism: Secondary | ICD-10-CM | POA: Diagnosis not present

## 2020-05-29 LAB — CBC WITH DIFFERENTIAL/PLATELET
Abs Immature Granulocytes: 0.05 10*3/uL (ref 0.00–0.07)
Basophils Absolute: 0 10*3/uL (ref 0.0–0.1)
Basophils Relative: 1 %
Eosinophils Absolute: 0 10*3/uL (ref 0.0–0.5)
Eosinophils Relative: 0 %
HCT: 27.4 % — ABNORMAL LOW (ref 39.0–52.0)
Hemoglobin: 8.9 g/dL — ABNORMAL LOW (ref 13.0–17.0)
Immature Granulocytes: 1 %
Lymphocytes Relative: 15 %
Lymphs Abs: 1 10*3/uL (ref 0.7–4.0)
MCH: 30.5 pg (ref 26.0–34.0)
MCHC: 32.5 g/dL (ref 30.0–36.0)
MCV: 93.8 fL (ref 80.0–100.0)
Monocytes Absolute: 0.4 10*3/uL (ref 0.1–1.0)
Monocytes Relative: 6 %
Neutro Abs: 5.5 10*3/uL (ref 1.7–7.7)
Neutrophils Relative %: 77 %
Platelets: 240 10*3/uL (ref 150–400)
RBC: 2.92 MIL/uL — ABNORMAL LOW (ref 4.22–5.81)
RDW: 20.2 % — ABNORMAL HIGH (ref 11.5–15.5)
WBC: 7.1 10*3/uL (ref 4.0–10.5)
nRBC: 0.4 % — ABNORMAL HIGH (ref 0.0–0.2)

## 2020-05-29 LAB — RENAL FUNCTION PANEL
Albumin: 2.7 g/dL — ABNORMAL LOW (ref 3.5–5.0)
Anion gap: 13 (ref 5–15)
BUN: 56 mg/dL — ABNORMAL HIGH (ref 8–23)
CO2: 26 mmol/L (ref 22–32)
Calcium: 8.4 mg/dL — ABNORMAL LOW (ref 8.9–10.3)
Chloride: 96 mmol/L — ABNORMAL LOW (ref 98–111)
Creatinine, Ser: 5.54 mg/dL — ABNORMAL HIGH (ref 0.61–1.24)
GFR, Estimated: 11 mL/min — ABNORMAL LOW (ref >60)
Glucose, Bld: 304 mg/dL — ABNORMAL HIGH (ref 70–99)
Phosphorus: 3.5 mg/dL (ref 2.5–4.6)
Potassium: 3.7 mmol/L (ref 3.5–5.1)
Sodium: 135 mmol/L (ref 135–145)

## 2020-05-29 LAB — GLUCOSE, CAPILLARY
Glucose-Capillary: 160 mg/dL — ABNORMAL HIGH (ref 70–99)
Glucose-Capillary: 93 mg/dL (ref 70–99)
Glucose-Capillary: 231 mg/dL — ABNORMAL HIGH (ref 70–99)
Glucose-Capillary: 245 mg/dL — ABNORMAL HIGH (ref 70–99)

## 2020-05-29 MED ORDER — DOXERCALCIFEROL 4 MCG/2ML IV SOLN
INTRAVENOUS | Status: AC
  Filled 2020-05-29: qty 2

## 2020-05-29 NOTE — Progress Notes (Signed)
Subjective:  Pt seen on HD, no c/o.    Objective Vital signs in last 24 hours: Vitals:   05/29/20 1040 05/29/20 1051 05/29/20 1151 05/29/20 1208  BP: (!) 149/56 (!) 150/60  135/71  Pulse:    86  Resp:  13  19  Temp: 98 F (36.7 C)   (!) 97.5 F (36.4 C)  TempSrc: Oral   Oral  SpO2: 100%  100% 100%  Weight:      Height:       Weight change: -0.1 kg  Intake/Output Summary (Last 24 hours) at 05/29/2020 1543 Last data filed at 05/29/2020 1040 Gross per 24 hour  Intake --  Output 1600 ml  Net -1600 ml   OP HD: MWF [facility]  3.5h  61.5kg  Hep none  LUA AVG  sensipar 60 q HD  hectorol 2 q tx  Last hgb 6.9 on 2/21  Assessment/Plan: 65 year old BM with ESRD and COPD-  Recent hosp for PNA did not recover -  Very anemic and with pleural effusion 1. SOB/ pneumonia: hx of COPD. Here w/ PNA, improved on IV abx. Aslo getting po prednisone per CCM.  2. ESRD: MWF HD. HD today.   3. Hypertension/volume: BP's good, under dry wt 3-4 kg, euvolemic on exam. Minimal UF w/ HD. Getting low dose metoprolol xl 12.5 qd.  4. Anemia ckd: anemia seemed out of proportion to just ESRD.  Got prbc's last week. Giving IV iron and high dose ESA to assist as well. Hb 8.5 now.  5. Metabolic Bone Disease: We will continue his home doses of Hectorol, Sensipar - hold phoslo for now as he does not seem to be eating much and phos is OK/low 6. DM-  Per primary  7. Dispo -  Looks like plan for DC tomorrow  Kelly Splinter, MD 05/29/2020, 3:43 PM    Labs: Basic Metabolic Panel: Recent Labs  Lab 05/24/20 0142 05/25/20 0048 05/27/20 0307 05/28/20 0036 05/29/20 0037  NA 135   < > 134* 135 135  K 3.5   < > 4.3 3.4* 3.7  CL 99   < > 98 96* 96*  CO2 22   < > '22 28 26  '$ GLUCOSE 246*   < > 337* 194* 304*  BUN 44*   < > 55* 27* 56*  CREATININE 5.50*   < > 6.06* 3.82* 5.54*  CALCIUM 8.3*   < > 8.9 8.1* 8.4*  PHOS 1.6*  --   --  2.6 3.5   < > = values in this interval not displayed.   Liver Function  Tests: Recent Labs  Lab 05/25/20 0048 05/26/20 0040 05/27/20 0307 05/28/20 0036 05/29/20 0037  AST '25 28 25  '$ --   --   ALT '26 25 24  '$ --   --   ALKPHOS 107 106 100  --   --   BILITOT 1.4* 1.3* 1.1  --   --   PROT 6.9 7.3 6.9  --   --   ALBUMIN 2.3* 2.3* 2.5* 2.5* 2.7*   No results for input(s): LIPASE, AMYLASE in the last 168 hours. No results for input(s): AMMONIA in the last 168 hours. CBC: Recent Labs  Lab 05/25/20 0048 05/26/20 0040 05/27/20 0307 05/28/20 0036 05/29/20 0037  WBC 8.9 6.7 7.3 7.8 7.1  NEUTROABS 7.1 6.4 6.6 6.4 5.5  HGB 7.9* 8.5* 8.1* 8.5* 8.9*  HCT 25.5* 27.6* 25.5* 26.7* 27.4*  MCV 95.1 95.5 94.8 94.0 93.8  PLT 202 220  237 220 240   Cardiac Enzymes: No results for input(s): CKTOTAL, CKMB, CKMBINDEX, TROPONINI in the last 168 hours. CBG: Recent Labs  Lab 05/28/20 1137 05/28/20 1553 05/28/20 2131 05/29/20 0620 05/29/20 1210  GLUCAP 221* 403* 445* 160* 93    Iron Studies:  No results for input(s): IRON, TIBC, TRANSFERRIN, FERRITIN in the last 72 hours. Studies/Results: DG CHEST PORT 1 VIEW  Result Date: 05/27/2020 CLINICAL DATA:  Short of breath.  Dialysis patient. EXAM: PORTABLE CHEST 1 VIEW COMPARISON:  05/20/2020 FINDINGS: Diffuse bilateral airspace disease consistent with edema with interval improvement. Small pleural effusions bilaterally. Improvement on the left. No change on the right. Heart size upper normal. IMPRESSION: Improvement in pulmonary edema. Small bilateral pleural effusions, with improvement on the left. Electronically Signed   By: Franchot Gallo M.D.   On: 05/27/2020 17:57   Medications: Infusions: . sodium chloride 250 mL (05/22/20 0847)    Scheduled Medications: . acidophilus  1 capsule Oral Daily  . aspirin EC  81 mg Oral Daily  . atorvastatin  80 mg Oral Daily  . Chlorhexidine Gluconate Cloth  6 each Topical Q0600  . cinacalcet  60 mg Oral Q M,W,F  . darbepoetin (ARANESP) injection - DIALYSIS  200 mcg Intravenous Q  Wed-HD  . doxercalciferol      . doxercalciferol  2 mcg Intravenous Q M,W,F-HD  . famotidine  20 mg Oral Q M,W,F  . feeding supplement (NEPRO CARB STEADY)  237 mL Oral BID BM  . glipiZIDE  5 mg Oral QAC breakfast  . insulin aspart  0-9 Units Subcutaneous TID WC  . insulin aspart  12 Units Subcutaneous TID WC  . metoprolol succinate  12.5 mg Oral Daily  . mometasone-formoterol  2 puff Inhalation BID  . multivitamin  1 tablet Oral QHS  . pneumococcal 23 valent vaccine  0.5 mL Intramuscular Tomorrow-1000  . predniSONE  40 mg Oral QAC breakfast  . ticagrelor  90 mg Oral BID  . umeclidinium bromide  1 puff Inhalation Daily  . zolpidem  5 mg Oral QHS    have reviewed scheduled and prn medications.  Physical Exam: General: alert, pleasant, NAD-  occ cough Heart: RRR Lungs:  better air movement Abdomen: soft, non tender Extremities: really no peripheral edema Dialysis Access: right AVF-  patent    05/29/2020,3:43 PM  LOS: 10 days

## 2020-05-29 NOTE — Plan of Care (Signed)
  Problem: Clinical Measurements: Goal: Ability to maintain clinical measurements within normal limits will improve Outcome: Progressing Goal: Will remain free from infection Outcome: Progressing Goal: Respiratory complications will improve Outcome: Progressing Goal: Cardiovascular complication will be avoided Outcome: Progressing   Problem: Activity: Goal: Risk for activity intolerance will decrease Outcome: Progressing   Problem: Nutrition: Goal: Adequate nutrition will be maintained Outcome: Progressing   Problem: Coping: Goal: Level of anxiety will decrease Outcome: Progressing   Problem: Pain Managment: Goal: General experience of comfort will improve Outcome: Progressing

## 2020-05-29 NOTE — Progress Notes (Signed)
Pt placed on CPAP with 3L O2 Pt is resting well at this time.

## 2020-05-29 NOTE — Progress Notes (Signed)
PROGRESS NOTE  Brandon Chaney MGQ:[id number] DOB: 1955/08/12 DOA: 05/19/2020 PCP: Patient, No Pcp Per  HPI/Recap of past 25 hours:  65 year old BM ESRD on HD-in [place]-proximal right upper extremity graft Dry weight is 61 kg COPD-mod CAD + stents 02/2020 EF dropped from 45% to 25% recently Herpes zoster ophthalmicus admitted from1/16- 1/22 and1/26 through 2/11: EF of 20 to 25% down from 45 to 50%, anemia and pneumonia.  High risk candidate per cardiology-medical management pursued-- was also seen by ID for Citrobacter koserithat grew on his culture after he underwent bronchoscopy. He was treated with ceftriaxone and oral cefdinir. He also went home on O2. -P/C- ANCA negative, ANA negative dsDNA negative anti-CCP negative rheumatoid marginally positive less likely autoimmune and sent home on cefdinir 2/14 -Quantiferon negative ? H/o RA in H&P from [facility]  Had hemodialysis day prior to admission 2/28-post-treatment Felt very fatigued at the end of it Does not recall having endoscopy or colonoscopy at either hospital Does not report dark tarry stools bloody emesis etc. Bolused 500 cc IV fluid in ED  Transferred [facility] from [facility] 05/19/2020  febrile 102 CXR infiltrates White count 11 lactic acid 2 procalcitonin 1.4 CRP 49 INR 1.3 t eceiving LR ASA Rocephin vancomycin Tylenol  On admission to [facility] showed BUN/creatinine 33/4.1 Potassium 3.1 Iron 34 saturation was 21 ferritin above 7500 Procalcitonin 23  Since admit 3/1 admission- [facility]to [facility] HD,  1.3 L fluid removal ongoing hemoptysis therefore pulmonary was consulted see below Underwent received 1 unit PRBC 3/1, underwent IR thoracentesis 3/2, BAL repeat 3/4 Currently adjusting volumes with HD and hopeful to challenge the same-more short of breath 3/9  05/29/20:  Seen post HD reports chest discomfort prior to HD today and now feels  better.   Assessment/Plan: Principal Problem:   Sepsis due to pneumonia [facility]) Active Problems:   ESRD (end stage renal disease) ([facility])   Acute respiratory failure with hypoxia (HCC)   Hypokalemia   Symptomatic anemia   COPD (chronic obstructive pulmonary disease) (HCC)   Elevated troponin   Chest pain   CAD (coronary artery disease)   Hemoptysis   Protein-calorie malnutrition, severe  Dyspnea from a combination of volume overload-high-output heart failure from anemia ?  Right-sided pneumonia?  Interstitial lung disease Recent Citrobacter -I mo Abx end-date at the was 05/04/2020 Antibiotics d/c 3/7 [5 days iv] as per pulmonology- -BAL culture from 3/4 NGTD-this will be coordinated in the outpatient setting to be repeated by Dr. Erin Fulling as per his note CT chest 3/4 = favour autoimmune cause pulmonology started Solu-Medrol->prednisone 40 on 3/9 SLP 3/6 = mild aspiration risk only-MBS performed 3/7 cleared for regular diet tolerating well Pleural effusion 1 view IR thoracentesis performed 3/2-with marked improvement to her breathing and oxygenation Hemoptysis Underwent bronchoscopy at [place] fear BAL culture = Citrobacter negative for malignancy, negative for autoimmune disease-etiology felt to be?  DOAC Repeat BAL performed here 3/4 as above--no growth feel that this is secondary to being on dual antiplatelet therapies in the setting of cough Acute anemia likely combination of renal disease, unclear blood loss             Hemoccult stool Given 1/2 units of blood on 3/1-hemoglobin ranging 7-8 Management as per nephrology with iron, aranesp ESRD MWF right upper extremity [place] Dry weight 61, currently 57             Patient below reported EDW per nephrology-only 1 liter off today  CAD  status post stent 02/2020 drop in EF from 45 to 25% Medical treatment is very high risk for bleeding for left heart cath Resumed aspirin Brilinta and Lipitor  Toprol to 12.5 Hold lisinopril on  discharge Chronic pain Home meds Percocet 1 tab every 4 as needed Uncontrolled DM TY 2 secondary to steroid             Glipizide started 3/6 CBG improved-discharged on low-dose             Continue 12 units 3 times daily COPD Herpes ophthalmicus  DVT prophylaxis: scd Code Status: FULL   Family Communication: None at bedside. 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 - Bronchoalveolar lavage was performed. - The airway examination was normal. Antimicrobials: Vanc Cefepime initially but then stopped as above   Status is: Inpatient    Dispo: The patient is from: Home              Anticipated d/c is to: Home               Patient currently not stable for dc post HD, monitoring symptomatology.   Difficult to place patient N/A        Objective: Vitals:   05/29/20 1040 05/29/20 1051 05/29/20 1151 05/29/20 1208  BP: (!) 149/56 (!) 150/60  135/71  Pulse:    86  Resp:  13  19  Temp: 98 F (36.7 C)   (!) 97.5 F (36.4 C)  TempSrc: Oral   Oral  SpO2: 100%  100% 100%  Weight:      Height:        Intake/Output Summary (Last 24 hours) at 05/29/2020 1400 Last data filed at 05/29/2020 1040 Gross per 24 hour  Intake 240 ml  Output 1800 ml  Net -1560 ml   Filed Weights   05/28/20 0349 05/29/20 0528 05/29/20 0700  Weight: 57.6 kg 58.1 kg 58.1 kg    Exam:   General: 65 y.o. year-old male Chronically ill appearing in  no acute distress.  Alert and oriented x3.  Cardiovascular: Regular rate and rhythm with no rubs or gallops.  No thyromegaly or JVD noted.    Respiratory: Clear to auscultation with no wheezes or rales. Good inspiratory effort.  Abdomen: Soft nontender nondistended with normal bowel sounds x4 quadrants.  Musculoskeletal: No lower extremity edema.  Skin: No ulcerative lesions noted or rashes.  Psychiatry: Mood is appropriate for condition and setting   Data Reviewed: CBC: Recent Labs  Lab 05/25/20 0048 05/26/20 0040 05/27/20 0307 05/28/20 0036 05/29/20 0037  WBC 8.9 6.7 7.3 7.8 7.1  NEUTROABS 7.1 6.4 6.6 6.4 5.5  HGB 7.9* 8.5* 8.1* 8.5* 8.9*  HCT 25.5* 27.6* 25.5* 26.7* 27.4*  MCV 95.1 95.5 94.8 94.0 93.8  PLT 202 220 237 220 935   Basic Metabolic Panel: Recent Labs  Lab 05/22/20 1417 05/23/20 0109 05/24/20 0142 05/25/20 0048 05/26/20 0040 05/27/20 0307 05/28/20 0036 05/29/20 0037  NA  132* 132* 135 131* 133* 134* 135 135  K 3.6 3.8 3.5 3.6 3.5 4.3 3.4* 3.7  CL 96* 97* 99 96* 96* 98 96* 96*  CO2 21* 24 22 20* _0 GLUCOSE 325* 303* 246* 242* 318* 337* 194* 304*  BUN 51* 28* 44* 59* 31* 55* 27* 56*  CREATININE 6.75* 3.82* 5.50* 6.88* 4.24* 6.06* 3.82* 5.54*  CALCIUM 8.0* 7.8* 8.3* 8.6* 8.6* 8.9 8.1* 8.4*  MG  --  1.8 1.8  --   --   --   --   --   PHOS 2.8 1.7* 1.6*  --   --   --  2.6 3.5   GFR: Estimated Creatinine Clearance: 10.9 mL/min (A) (by C-G formula based on SCr of 5.54 mg/dL (H)). Liver Function Tests: Recent Labs  Lab 05/25/20 0048 05/26/20 0040 05/27/20 0307 05/28/20 0036 05/29/20 0037  AST _1 --   --   ALT _2 --   --   ALKPHOS 107 106 100  --   --   BILITOT 1.4* 1.3* 1.1  --   --   PROT 6.9 7.3 6.9  --   --   ALBUMIN 2.3* 2.3* 2.5* 2.5* 2.7*   No results for input(s): LIPASE, AMYLASE in the last 168 hours. No results for input(s): AMMONIA in the last 168 hours. Coagulation Profile: No results for input(s): INR,  PROTIME in the last 168 hours. Cardiac Enzymes: No results for input(s): CKTOTAL, CKMB, CKMBINDEX, TROPONINI in the last 168 hours. BNP (last 3 results) No results for input(s): PROBNP in the last 8760 hours. HbA1C: No results for input(s): HGBA1C in the last 72 hours. CBG: Recent Labs  Lab 05/28/20 1137 05/28/20 1553 05/28/20 2131 05/29/20 0620 05/29/20 1210  GLUCAP 221* 403* 445* 160* 93   Lipid Profile: No results for input(s): CHOL, HDL, LDLCALC, TRIG, CHOLHDL, LDLDIRECT in the last 72 hours. Thyroid Function Tests: No results for input(s): TSH, T4TOTAL, FREET4, T3FREE, THYROIDAB in the last 72 hours. Anemia Panel: No results for input(s): VITAMINB12, FOLATE, FERRITIN, TIBC, IRON, RETICCTPCT in the last 72 hours. Urine analysis:    Component Value Date/Time   COLORURINE AMBER (A) 05/19/2020 1344   APPEARANCEUR CLEAR 05/19/2020 1344   LABSPEC 1.020 05/19/2020 1344   PHURINE 6.0 05/19/2020 1344   GLUCOSEU 50 (A) 05/19/2020 1344   HGBUR NEGATIVE 05/19/2020 1344   BILIRUBINUR NEGATIVE 05/19/2020 1344   KETONESUR 5 (A) 05/19/2020 1344   PROTEINUR >=300 (A) 05/19/2020 1344   NITRITE NEGATIVE 05/19/2020 1344   LEUKOCYTESUR NEGATIVE 05/19/2020 1344   Sepsis Labs: _3 (procalcitonin:4,lacticidven:4)  ) Recent Results (from the past 240 hour(s))  MRSA PCR Screening     Status: None   Collection Time: 05/19/20  5:59 PM   Specimen: Nasal Mucosa; Nasopharyngeal  Result Value Ref Range Status   MRSA by PCR NEGATIVE NEGATIVE Final    Comment:        The GeneXpert MRSA Assay (FDA approved for NASAL specimens only), is one component of a comprehensive MRSA colonization surveillance program. It is not intended to diagnose MRSA infection nor to guide or monitor treatment for MRSA infections. Performed at [facility], [place] [address] [place], [place] [address]   Acid Fast Smear (AFB)     Status: None   Collection Time: 05/20/20  3:07 PM   Specimen: Lung,  Right; Pleural Fluid  Result Value Ref Range Status   AFB Specimen Processing Concentration  Final  Acid Fast Smear Negative  Final    Comment: (NOTE) Performed At: [facility] [place], [place] [id number] Rush Farmer MD PH:[id number]    Source (AFB) FLUID  Final    Comment: PLEURAL RIGHT Performed at [facility], [place] [address], [place], [place] [address]   Culture, Respiratory w Gram Stain     Status: None   Collection Time: 05/22/20  9:33 AM   Specimen: Bronchial Alveolar Lavage; Respiratory  Result Value Ref Range Status   Specimen Description BRONCHIAL ALVEOLAR LAVAGE  Final   Special Requests BRONCHIAL LAVAGE RIGHT MIDDLE LOBE SPEC A  Final   Gram Stain   Final    RARE WBC PRESENT,BOTH PMN AND MONONUCLEAR NO ORGANISMS SEEN    Culture   Final    FEW Normal respiratory flora-no Staph aureus or Pseudomonas seen Performed at [place] [address], [place], [place] [address]    Report Status 05/24/2020 FINAL  Final  Acid Fast Smear (AFB)     Status: None   Collection Time: 05/22/20  9:33 AM   Specimen: Bronchial Alveolar Lavage; Respiratory  Result Value Ref Range Status   AFB Specimen Processing Concentration  Final   Acid Fast Smear Negative  Final    Comment: (NOTE) Performed At: [facility] [place], [place] [id number] Rush Farmer MD FM:[id number]    Source (AFB) BRONCHIAL LAVAGE RIGHT MIDDLE LOBE SPEC A  Final    Comment: Performed at [facility], [place] [address], [place], [place] [address]      Studies: No results found.  Scheduled Meds:  acidophilus  1 capsule Oral Daily   aspirin EC  81 mg Oral Daily   atorvastatin  80 mg Oral Daily   Chlorhexidine Gluconate Cloth  6 each Topical Q0600   cinacalcet  60 mg Oral Q M,W,F   darbepoetin (ARANESP) injection - DIALYSIS  200 mcg Intravenous Q Wed-HD   doxercalciferol       doxercalciferol  2 mcg Intravenous Q M,W,F-HD    famotidine  20 mg Oral Q M,W,F   feeding supplement (NEPRO CARB STEADY)  237 mL Oral BID BM   glipiZIDE  5 mg Oral QAC breakfast   insulin aspart  0-9 Units Subcutaneous TID WC   insulin aspart  12 Units Subcutaneous TID WC   metoprolol succinate  12.5 mg Oral Daily   mometasone-formoterol  2 puff Inhalation BID   multivitamin  1 tablet Oral QHS   pneumococcal 23 valent vaccine  0.5 mL Intramuscular Tomorrow-1000   predniSONE  40 mg Oral QAC breakfast   ticagrelor  90 mg Oral BID   umeclidinium bromide  1 puff Inhalation Daily   zolpidem  5 mg Oral QHS    Continuous Infusions:  sodium chloride 250 mL (05/22/20 0847)     LOS: 10 days     Kayleen Memos, MD [facility] Pager [phone]  If 7PM-7AM, please contact night-coverage [email or web address] Password [email or web address] 05/29/2020, 2:00 PM

## 2020-05-30 DIAGNOSIS — A419 Sepsis, unspecified organism: Secondary | ICD-10-CM | POA: Diagnosis not present

## 2020-05-30 DIAGNOSIS — J189 Pneumonia, unspecified organism: Secondary | ICD-10-CM | POA: Diagnosis not present

## 2020-05-30 LAB — GLUCOSE, CAPILLARY
Glucose-Capillary: 133 mg/dL — ABNORMAL HIGH (ref 70–99)
Glucose-Capillary: 285 mg/dL — ABNORMAL HIGH (ref 70–99)

## 2020-05-30 MED ORDER — GLIPIZIDE 5 MG PO TABS: 2.5000 mg | ORAL_TABLET | Freq: Every day | ORAL | 0 refills | Status: DC

## 2020-05-30 MED ORDER — PREDNISONE 5 MG PO TABS: 5.0000 mg | ORAL_TABLET | Freq: Every day | ORAL | 0 refills | Status: DC

## 2020-05-30 MED ORDER — RENA-VITE PO TABS: 1.0000 | ORAL_TABLET | Freq: Every day | ORAL | 0 refills | Status: AC

## 2020-05-30 MED ORDER — RENA-VITE PO TABS
1.0000 | ORAL_TABLET | Freq: Every day | ORAL | 0 refills | Status: DC
Start: 2020-05-30 — End: 2020-05-30

## 2020-05-30 MED ORDER — GLIPIZIDE 5 MG PO TABS: 2.5000 mg | ORAL_TABLET | Freq: Every day | ORAL | 0 refills | Status: AC

## 2020-05-30 NOTE — Discharge Summary (Addendum)
Discharge Summary  Brandon Chaney ZOX:[id number] DOB: [birth year]  PCP: Patient, No Pcp Per  Admit date: 05/19/2020 Discharge date: 05/30/2020  Time spent: 35 minutes.  Recommendations for Outpatient Follow-up:  1. Follow-up with pulmonary Dr. Erin Chaney on 06/26/2020 at 8:30 AM. 2. Follow-up with your cardiologist at the [facility] within a week. 3. Follow-up with your PCP at the [facility] in 1 to 2 weeks. 4. Take your medications as prescribed.    Pulmonary recommendations: He received solumedrol 30m daily on 3/7-3/8. Tapered to 439mof prednisone on 3/9.  He appears to be receiving benefit from the steroids with reduction in cough and hemoptysis.  Recommend he complete a month long steroid taper: 4075mhrough 3/15,  then 16m46m16 to 3/22,  then 10mg84m3 to 3/29,  then 5mg 360m to 4/5.   Will trial patient on nocturnal CPAP for his history of heart failure and COPD and see if he benefits from this. If he does benefit then he will need a sleep study outpatient to qualify for a cpap machine.   Discharge Diagnoses:  Active Hospital Problems   Diagnosis Date Noted  . Sepsis due to pneumonia (HCC) 0[place]1/2022  . Protein-calorie malnutrition, severe 05/21/2020  . ESRD (end stage renal disease) (HCC) 0[place]1/2022  . Acute respiratory failure with hypoxia (HCC) 0[place]1/2022  . Hypokalemia 05/19/2020  . Symptomatic anemia 05/19/2020  . COPD (chronic obstructive pulmonary disease) (HCC) 0[place]1/2022  . Elevated troponin 05/19/2020  . Chest pain 05/19/2020  . CAD (coronary artery disease) 05/19/2020  . Hemoptysis     Resolved Hospital Problems  No resolved problems to display.    Discharge Condition: Stable.  Diet recommendation: Renal carb modified diet.  Vitals:   05/30/20 0744 05/30/20 1051  BP: (!) 115/44 (!) 112/91  Pulse: 89 89  Resp: 20 20  Temp: 98.8 F (37.1 C) 98.5 F (36.9 C)  SpO2: 97% 91%    History of present illness:  64 yea55old BM ESRD on HD-in  [place]-proximal right upper extremity graft Dry weight is 61 kg COPD-mod CAD + stents 02/2020 EF dropped from 45% to 25% recently Herpes zoster ophthalmicus admitted from1/16- 1/22 and1/26 through 2/11: EF of 20 to 25% down from 45 to 50%, anemia and pneumonia. High risk candidate per cardiology-medical management pursued-- was also seen by ID for Citrobacter koserithat grew on his culture after he underwent bronchoscopy. He was treated with ceftriaxone and oral cefdinir. He went home on O2. -P/C- ANCA negative, ANA negative dsDNA negative anti-CCP negative rheumatoid marginally positive less likely autoimmune and sent home on cefdinir 2/14 -Quantiferon negative ? 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 Bolused 500 cc IV fluid in ED  [facility] fe[facility] 102 CXR infiltrates White count 11 lactic acid 2 procalcitonin 1.4 CRP 49 INR 1.3   [facility] 33/4.1 Potassium 3.1 Iron 34 saturation was 21 ferritin above 7500 Procalcitonin 23  [facility]3 [facility] fluid removal.   Pulmonary consulted due to hemoptysis. Received 1 unit PRBC 05/19/20, underwent IR thoracentesis 3/2. Volume status adjusted via HD with improvement.  Was started on steroid taper with improvement of hemoptysis.  Pulmonary recommended 1 month taper of steroid.  05/30/20:  Seen and examined at his bedside.  He denies having any hemoptysis at the time of this visit.  Reports mild hemoptysis yesterday, he states it was a lot less than previously.  He has no new complaints at the time of this visit.    Advised to follow-up with pulmonary and to keep  his scheduled appointment.  Also advised to follow-up with his cardiologist within a week.  Patient understands and agrees with plan.  Hospital Course:  Principal Problem:   Sepsis due to pneumonia [facility]) Active Problems:   ESRD (end stage renal disease) ([place])   Acute respiratory failure with hypoxia (HCC)   Hypokalemia   Symptomatic anemia   COPD (chronic obstructive pulmonary disease) (HCC)   Elevated troponin   Chest pain   CAD (coronary artery disease)   Hemoptysis   Protein-calorie malnutrition, severe  Dyspnea from a combination of volume overload-high-output heart failure from anemia,Interstitial lung disease Recent Citrobacter -Abx end-date was 05/04/2020 Antibiotics d/c 05/25/20 [5 days iv] as per pulmonology- -BAL culture from 3/4 NGTD-this will be coordinated in the outpatient setting to be repeated by Dr. Erin Chaney as per his note CT chest 05/22/20 =favourautoimmune cause pulmonology started Solu-Medrol->prednisone 40 on 3/9, continue taper doses as recommended by pulmonary. SLP 3/6 = mild aspiration risk only-MBS performed 3/7 cleared for regular diet tolerating well Pleural effusion 1 view IR thoracentesis performed 3/2-with marked improvementto breathing and oxygenation Hemoptysis Underwent bronchoscopy at [place] fear BAL culture = Citrobacter negative for malignancy, negative for autoimmune disease-unclear etiology.  Will need to follow up with Dr. Erin Chaney. Repeat BAL performed here 3/4 as above--no growth feel that this is secondary to being on dual antiplatelet therapies in the setting of cough Acute anemia likely combination of anemia of chronic disease from renal disease, unclear blood loss Hemoccult stool Given 1/2 units of blood on 3/1-hemoglobin ranging 7-8 Management as per nephrology with iron, aranesp ESRD MWF right upper extremity [place] Dry weight 61, currently 21 Keep your hemodialysis appointments. CAD status post stent 02/2020 drop in EF  from 45 to 25% Medical treatment is very high risk for bleeding for left heart cath Continue aspirin Brilinta and Lipitor  Home lisinopril on hold due to hypotension. Follow-up with your cardiologist within a week. Chronic pain Continue home regimen. Uncontrolled prediabetes with hyperglycemia likely exacerbated by prednisone. Glipizide started 3/6 CBG improved-discharged on low-dose 2.5 mg daily.              avoid hypoglycemia.             COPD Herpes ophthalmicus Follow-up with your PCP outpatient.   Code Status:FULL  Consultants:  Nephrology   IR  Cardiology  Pulmonology   Procedures: IR thoracentesis Bronchoalveolar lavage on 3/4    Discharge Exam: BP (!) 112/91 (BP Location: Left Arm)   Pulse 89   Temp 98.5 F (36.9 C) (Oral)   Resp 20   Ht _0  (1.753 m)   Wt 57 kg   SpO2 91%   BMI 18.56 kg/m  . General: 65 y.o. year-old male well developed well nourished in no acute distress.  Alert and oriented x3. . Cardiovascular: Regular rate and rhythm with no rubs or gallops.  No thyromegaly or JVD noted.   Marland Kitchen Respiratory: Clear to auscultation with no wheezes or rales. Good inspiratory effort. . Abdomen: Soft nontender nondistended with normal bowel sounds x4 quadrants. . Musculoskeletal: No lower extremity edema.  Marland Kitchen Psychiatry: Mood is appropriate for condition and setting  Discharge Instructions You  were cared for by a hospitalist during your hospital stay. If you have any questions about your discharge medications or the care you received while you were in the hospital after you are discharged, you can call the unit and asked to speak with the hospitalist on call if the hospitalist that took care of you is not available. Once you are discharged, your primary care physician will handle any further medical issues. Please note that NO REFILLS for any discharge medications will be authorized once you are discharged, as it is imperative  that you return to your primary care physician (or establish a relationship with a primary care physician if you do not have one) for your aftercare needs so that they can reassess your need for medications and monitor your lab values.   Allergies as of 05/30/2020      Reactions   Duragesic-100 [fentanyl] Nausea Only   Sulfa Antibiotics    Heart flutters/itching      Medication List    STOP taking these medications   diltiazem 240 MG 24 hr capsule Commonly known as: DILACOR XR   lisinopril 10 MG tablet Commonly known as: ZESTRIL   sodium bicarbonate 650 MG tablet     TAKE these medications   acidophilus Caps capsule Take 1 capsule by mouth daily. Notes to patient: Tomorrow morning 05/31/2020   albuterol (2.5 MG/3ML) 0.083% nebulizer solution Commonly known as: PROVENTIL Take 2.5 mg by nebulization every 6 (six) hours as needed for wheezing or shortness of breath.   aspirin EC 81 MG tablet Take 81 mg by mouth daily. Swallow whole. Notes to patient: Next dose 05/31/2020   atorvastatin 80 MG tablet Commonly known as: LIPITOR Take 80 mg by mouth daily. Notes to patient: Next dose 05/31/2020   cinacalcet 30 MG tablet Commonly known as: SENSIPAR Take 30 mg by mouth daily. Notes to patient: Next dose 05/31/2020   diphenhydrAMINE 25 mg capsule Commonly known as: BENADRYL Take 25 mg by mouth every 6 (six) hours as needed.   famotidine 20 MG tablet Commonly known as: PEPCID Take 20 mg by mouth See admin instructions. Takes 1 tablet 3 times a week   ferrous sulfate 325 (65 FE) MG tablet Take 325 mg by mouth daily with breakfast.   glipiZIDE 5 MG tablet Commonly known as: GLUCOTROL Take 0.5 tablets (2.5 mg total) by mouth daily before breakfast. Start taking on: May 31, 2020   multivitamin Tabs tablet Take 1 tablet by mouth at bedtime.   predniSONE 5 MG tablet Commonly known as: DELTASONE Take 1 tablet (5 mg total) by mouth daily with breakfast. --> Take 78m daily  through 06/02/20, then, --> Take 272mdaily from 06/03/20 to 06/09/20, then, --> Take 1031maily from 06/10/20 to 06/16/20, then, --> Take 5mg7mily from 06/17/20 to 06/23/20.   tamsulosin 0.4 MG Caps capsule Commonly known as: FLOMAX Take 0.4 mg by mouth daily.   ticagrelor 90 MG Tabs tablet Commonly known as: BRILINTA Take 90 mg by mouth 2 (two) times daily.   tiotropium 18 MCG inhalation capsule Commonly known as: SPIRIVA Place 18 mcg into inhaler and inhale daily.            Durable Medical Equipment  (From admission, onward)         Start     Ordered   05/30/20 1027  For home use only DME continuous positive airway pressure (CPAP)  Once       Question Answer Comment  Length of Need 12  Months   Patient has OSA or probable OSA Yes   Is the patient currently using CPAP in the home Yes   If yes (to question two) Determine DME provider and inform them of any new orders/settings   Settings Autotitration   Signs and symptoms of probable OSA  (select all that apply) Snoring   Signs and symptoms of probable OSA  (select all that apply) Gasping during sleep   CPAP supplies needed Mask, headgear, cushions, filters, heated tubing and water chamber      05/30/20 1026         Allergies  Allergen Reactions  . Duragesic-100 [Fentanyl] Nausea Only  . Sulfa Antibiotics     Heart flutters/itching    Follow-up Information    Freddi Starr, MD Follow up on 06/26/2020.   Specialty: Pulmonary Disease Why: Appointment is at 8:30am Contact information: [address] [place] [address] [place] Follow up.   Why: Please call the [facility] in [place], [place] for primary care physician follow up in the next 7-10 days. Contact information: [address] [place] [address] [phone]                The results of significant diagnostics from this hospitalization (including imaging, microbiology,  ancillary and laboratory) are listed below for reference.    Significant Diagnostic Studies: DG Chest 1 View  Result Date: 05/20/2020 CLINICAL DATA:  Post right thoracentesis. EXAM: CHEST  1 VIEW COMPARISON:  05/19/2020. FINDINGS: Heart size stable. Diffuse bilateral pulmonary infiltrates/edema with slight interim improvement from prior exam. Improvement right pleural effusion. Stable small left pleural effusion. No pneumothorax post thoracentesis. Bilateral upper extremity stents noted. Peripheral vascular calcification. IMPRESSION: 1. No pneumothorax post thoracentesis. 2. Diffuse bilateral pulmonary infiltrates/edema with slight interim improvement from prior exam. Improvement and right pleural effusion. Stable small left pleural effusion. Electronically Signed   By: Marcello Moores  Register   On: 05/20/2020 15:42   CT CHEST WO CONTRAST  Result Date: 05/22/2020 CLINICAL DATA:  Follow-up pneumonia EXAM: CT CHEST WITHOUT CONTRAST TECHNIQUE: Multidetector CT imaging of the chest was performed following the standard protocol without IV contrast. COMPARISON:  Plain film from 05/20/2020 FINDINGS: Cardiovascular: Somewhat limited due to lack of IV contrast. Mild decreased attenuation of the cardiac blood pool is noted consistent with underlying anemia. Diffuse coronary and aortic calcifications are noted. No aneurysmal dilatation is seen. Vascular stenting is noted in the right arm. Mediastinum/Nodes: Thoracic inlet is within normal limits. Scattered small hilar and mediastinal lymph nodes are seen likely reactive in nature. Lungs/Pleura: Bilateral pleural effusions are noted right considerably greater than left. Right lower lobe consolidation is noted consistent with acute pneumonia. Additionally changes of central vascular congestion and edema are noted similar to that seen on prior plain film examination. This may be related to a degree of volume overload. Upper Abdomen: Visualized upper abdomen shows no acute  abnormality. Musculoskeletal: No acute bony abnormality is noted. Degenerative changes of the thoracic spine are seen. 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 IMPRESSION: Right lower lobe pneumonia. Bilateral pleural effusions and findings suggestive of CHF similar to that noted on prior plain film examination. Aortic Atherosclerosis (ICD10-I70.0). Electronically Signed   By: Inez Catalina M.D.   On: 05/22/2020 05:21   NM Pulmonary Perfusion  Result Date: 05/19/2020 CLINICAL DATA:  Chest pain, shortness of breath and elevated D-dimer. The patient has pneumonia. EXAM: NUCLEAR  MEDICINE PERFUSION LUNG SCAN TECHNIQUE: Perfusion images were obtained in multiple projections after intravenous injection of radiopharmaceutical. Ventilation scans intentionally deferred if perfusion scan and chest x-ray adequate for interpretation during COVID 19 epidemic. RADIOPHARMACEUTICALS:  4.4 mCi Tc-52mMAA IV COMPARISON:  Single-view of the chest earlier today. FINDINGS: Small nonsegmental defects are seen in the upper lobes bilaterally. Defect on the right corresponds with finding on chest film. No definite correlation with defect on the left is seen. Perfusion is otherwise normal. IMPRESSION: Small nonsegmental defects in the upper lobes bilaterally are likely related to the patient's pneumonia rather than pulmonary embolus. Electronically Signed   By: TInge RiseM.D.   On: 05/19/2020 14:43   DG CHEST PORT 1 VIEW  Result Date: 05/27/2020 CLINICAL DATA:  Short of breath.  Dialysis patient. EXAM: PORTABLE CHEST 1 VIEW COMPARISON:  05/20/2020 FINDINGS: Diffuse bilateral airspace disease consistent with edema with interval improvement. Small pleural effusions bilaterally. Improvement on the left. No change on the right. Heart size upper normal. IMPRESSION: Improvement in pulmonary edema. Small bilateral pleural effusions, with improvement on the left. Electronically Signed   By: CFranchot GalloM.D.   On:  05/27/2020 17:57   DG CHEST PORT 1 VIEW  Result Date: 05/19/2020 CLINICAL DATA:  Pneumonia. EXAM: PORTABLE CHEST 1 VIEW COMPARISON:  One-view chest x-ray 05/19/2020 FINDINGS: Heart is enlarged. Atherosclerotic calcifications are present. Diffuse interstitial pattern is again seen. Moderate right pleural effusion present. No pneumothorax. Bibasilar airspace disease is worse right than left. IMPRESSION: 1. Cardiomegaly with diffuse interstitial pattern compatible with edema. Infection is not excluded. Patchy opacities seen on the prior study are less prominent. Pattern is more diffuse. 2. Moderate right pleural effusion. 3. Bibasilar airspace disease is worse on the right than left. Electronically Signed   By: CSan MorelleM.D.   On: 05/19/2020 10:47   DG CHEST PORT 1 VIEW  Result Date: 05/19/2020 CLINICAL DATA:  Sepsis, pneumonia, hemoptysis EXAM: PORTABLE CHEST 1 VIEW COMPARISON:  Portable exam 0515 hours without priors for comparison FINDINGS: Normal heart size and mediastinal contours. BILATERAL pulmonary infiltrates favor multifocal pneumonia, less likely edema Probable small RIGHT pleural effusion. No pneumothorax or acute osseous findings. Vascular stents at the axilla bilaterally. IMPRESSION: Diffuse BILATERAL pulmonary infiltrates favoring multifocal pneumonia. Electronically Signed   By: MLavonia DanaM.D.   On: 05/19/2020 08:22   DG Swallowing Func-Speech Pathology  Result Date: 05/25/2020 Objective Swallowing Evaluation: Type of Study: MBS-Modified Barium Swallow Study  Patient Details Name: CSantanna OlenikMRN: [id number] of Birth: 9April 30, 1957Today's Date: 05/25/2020 Time: SLP Start Time (ACUTE ONLY): 0859 -SLP Stop Time (ACUTE ONLY): 03500SLP Time Calculation (min) (ACUTE ONLY): 12 min Past Medical History: Past Medical History: Diagnosis Date . CAD (coronary artery disease) 05/19/2020 . COPD [place]ronic obstructive pulmonary disease) ([place] 05/19/[place] . ESR[place] ([place] stage renal disease) ([place]  05/19/2020 . Herpes zoster ophthalmicus, right eye  Past Surgical History: Past Surgical History: Procedure Laterality Date . ARTERIOVENOUS GRAFT PLACEMENT Right  . CORONARY ANGIOPLASTY   . IR THORACENTESIS ASP PLEURAL SPACE W/IMG GUIDE  05/20/2020 HPI: Pt is a 65y.o. male with medical history significant for ESRD on hemodialysis, COPD, coronary artery disease with stents, history of zoster ophthalmicus, recent hospital admission for pneumonia and symptomatic anemia, and supplemental oxygen requirement since the recent hospitalization. Pt presented to the ED for evaluation of chest pain, shortness of breath, and persistent cough.  CT chest 3/4: Right lower lobe pneumonia. SLP consulted due to concern for aspiration.  Subjective: Pt pleasant and cooperative Assessment / Plan / Recommendation CHL IP CLINICAL IMPRESSIONS 05/25/2020 Clinical Impression Pt presents with an oropharyngeal swallow that is [facility]. One instance of penetration (PAS 2) was noted due to timing in which the bolus spilled under his epiglottis; however, the penetrates were cleared as the pt swallowed. Although penetration occured, his swallow still is efficient and safe with appropriate airway protection of all consistencies. Pt's subjective symptoms appear more esophageal so recomend follow up from GI if symptoms continue to persist. Provided education such as taking smaller sips and bites as well as sitting up right following PO intake to help with these subjective symptoms. Recomend that pt continuation a regular diet and thin liquids. SLP will sign off at this time. SLP Visit Diagnosis Dysphagia, unspecified (R13.10) Attention and concentration deficit following -- Frontal lobe and executive function deficit following -- Impact on safety and function Mild aspiration risk   CHL IP TREATMENT RECOMMENDATION 05/25/2020 Treatment Recommendations No treatment recommended at this time   Prognosis 05/25/2020 Prognosis for Safe Diet Advancement Good Barriers to  Reach Goals -- Barriers/Prognosis Comment -- CHL IP DIET RECOMMENDATION 05/25/2020 SLP Diet Recommendations Regular solids;Thin liquid Liquid Administration via Cup;Straw Medication Administration Whole meds with liquid Compensations Slow rate;Small sips/bites Postural Changes Remain semi-upright after after feeds/meals (Comment);Seated upright at 90 degrees   CHL IP OTHER RECOMMENDATIONS 05/25/2020 Recommended Consults Consider GI evaluation Oral Care Recommendations Oral care BID Other Recommendations --   CHL IP FOLLOW UP RECOMMENDATIONS 05/25/2020 Follow up Recommendations None   CHL IP FREQUENCY AND DURATION 05/24/2020 Speech Therapy Frequency (ACUTE ONLY) min 1 x/week Treatment Duration 1 week      CHL IP ORAL PHASE 05/25/2020 Oral Phase WFL Oral - Pudding Teaspoon -- Oral - Pudding Cup -- Oral - Honey Teaspoon -- Oral - Honey Cup -- Oral - Nectar Teaspoon -- Oral - Nectar Cup -- Oral - Nectar Straw -- Oral - Thin Teaspoon -- Oral - Thin Cup -- Oral - Thin Straw -- Oral - Puree -- Oral - Mech Soft -- Oral - Regular -- Oral - Multi-Consistency -- Oral - Pill -- Oral Phase - Comment --  CHL IP PHARYNGEAL PHASE 05/25/2020 Pharyngeal Phase Impaired Pharyngeal- Pudding Teaspoon -- Pharyngeal -- Pharyngeal- Pudding Cup -- Pharyngeal -- Pharyngeal- Honey Teaspoon -- Pharyngeal -- Pharyngeal- Honey Cup -- Pharyngeal -- Pharyngeal- Nectar Teaspoon -- Pharyngeal -- Pharyngeal- Nectar Cup -- Pharyngeal -- Pharyngeal- Nectar Straw -- Pharyngeal -- Pharyngeal- Thin Teaspoon -- Pharyngeal -- Pharyngeal- Thin Cup WFL Pharyngeal -- Pharyngeal- Thin Straw Penetration/Aspiration before swallow Pharyngeal Material enters airway, remains ABOVE vocal cords then ejected out Pharyngeal- Puree WFL Pharyngeal -- Pharyngeal- Mechanical Soft -- Pharyngeal -- Pharyngeal- Regular WFL Pharyngeal -- Pharyngeal- Multi-consistency -- Pharyngeal -- Pharyngeal- Pill WFL Pharyngeal -- Pharyngeal Comment --  CHL IP CERVICAL ESOPHAGEAL PHASE 05/25/2020 Cervical  Esophageal Phase WFL Pudding Teaspoon -- Pudding Cup -- Honey Teaspoon -- Honey Cup -- Nectar Teaspoon -- Nectar Cup -- Nectar Straw -- Thin Teaspoon -- Thin Cup -- Thin Straw -- Puree -- Mechanical Soft -- Regular -- Multi-consistency -- Pill -- Cervical Esophageal Comment -- Note populated for Lebron Conners, Student SLP Osie Bond., M.A. [facility] Pager [phone] Office [phone] 05/25/2020, 10:28 AM              IR THORACENTESIS ASP PLEURAL SPACE W/IMG GUIDE  Result Date: 05/20/2020 INDICATION: Shortness of breath. Right pleural effusions. Request made for therapeutic and diagnostic thoracentesis. EXAM: ULTRASOUND GUIDED RIGHT THORACENTESIS  MEDICATIONS: 10 mL 1% lidocaine COMPLICATIONS: None immediate. PROCEDURE: An ultrasound guided thoracentesis was thoroughly discussed with the patient and questions answered. The benefits, risks, alternatives and complications were also discussed. The patient understands and wishes to proceed with the procedure. Written consent was obtained. Ultrasound was performed to localize and mark an adequate pocket of fluid in the right chest. The area was then prepped and draped in the normal sterile fashion. 1% Lidocaine was used for local anesthesia. Under ultrasound guidance a 6 Fr Safe-T-Centesis catheter was introduced. Thoracentesis was performed. The catheter was removed and a dressing applied. FINDINGS: A total of approximately 1.3 L of clear yellow fluid was removed. Samples were sent to the laboratory as requested by the clinical team. Post procedure chest X-ray reviewed, negative for pneumothorax. IMPRESSION: Successful ultrasound guided right thoracentesis yielding 1.3 L of pleural fluid. Read by: Durenda Guthrie, PA-C Electronically Signed   By: Sandi Mariscal M.D.   On: 05/20/2020 15:53    Microbiology: Recent Results (from the past 240 hour(s))  Acid Fast Smear (AFB)     Status: None   Collection Time: 05/20/20  3:07 PM   Specimen: Lung,  Right; Pleural Fluid  Result Value Ref Range Status   AFB Specimen Processing Concentration  Final   Acid Fast Smear Negative  Final    Comment: (NOTE) Performed At: [facility] [place], [place] [id number] Rush Farmer MD GG:[id number]    Source (AFB) FLUID  Final    Comment: PLEURAL RIGHT Performed at [facility], [place] [address] [place], [place] [address]   Culture, Respiratory w Gram Stain     Status: None   Collection Time: 05/22/20  9:33 AM   Specimen: Bronchial Alveolar Lavage; Respiratory  Result Value Ref Range Status   Specimen Description BRONCHIAL ALVEOLAR LAVAGE  Final   Special Requests BRONCHIAL LAVAGE RIGHT MIDDLE LOBE SPEC A  Final   Gram Stain   Final    RARE WBC PRESENT,BOTH PMN AND MONONUCLEAR NO ORGANISMS SEEN    Culture   Final    FEW Normal respiratory flora-no Staph aureus or Pseudomonas seen Performed at [facility] [address], [place], [place] [address]    Report Status 05/24/2020 FINAL  Final  Acid Fast Smear (AFB)     Status: None   Collection Time: 05/22/20  9:33 AM   Specimen: Bronchial Alveolar Lavage; Respiratory  Result Value Ref Range Status   AFB Specimen Processing Concentration  Final   Acid Fast Smear Negative  Final    Comment: (NOTE) Performed At: [facility] [place], [place] [id number] Rush Farmer MD VE:[id number]    Source (AFB) BRONCHIAL LAVAGE RIGHT MIDDLE LOBE SPEC A  Final    Comment: Performed at [facility], [place] [address] [place], [place] [address]     Labs: Basic Metabolic Panel: Recent Labs  Lab 05/24/20 0142 05/25/20 0048 05/26/20 0040 05/27/20 0307 05/28/20 0036 05/29/20 0037  NA 135 131* 133* 134* 135 135  K 3.5 3.6 3.5 4.3 3.4* 3.7  CL 99 96* 96* 98 96* 96*  CO2 22 20* _0 GLUCOSE 246* 242* 318* 337* 194* 304*  BUN 44* 59* 31* 55* 27* 56*  CREATININE 5.50* 6.88* 4.24* 6.06* 3.82* 5.54*  CALCIUM 8.3* 8.6* 8.6* 8.9  8.1* 8.4*  MG 1.8  --   --   --   --   --   PHOS 1.6*  --   --   --  2.6 3.5   Liver Function Tests: Recent Labs  Lab 05/25/20 0048 05/26/20 0040 05/27/20 0307 05/28/20 0036 05/29/20 0037  AST _0 --   --   ALT _1 --   --   ALKPHOS 107 106 100  --   --   BILITOT 1.4* 1.3* 1.1  --   --   PROT 6.9 7.3 6.9  --   --   ALBUMIN 2.3* 2.3* 2.5* 2.5* 2.7*   No results for input(s): LIPASE, AMYLASE in the last 168 hours. No results for input(s): AMMONIA in the last 168 hours. CBC: Recent Labs  Lab 05/25/20 0048 05/26/20 0040 05/27/20 0307 05/28/20 0036 05/29/20 0037  WBC 8.9 6.7 7.3 7.8 7.1  NEUTROABS 7.1 6.4 6.6 6.4 5.5  HGB 7.9* 8.5* 8.1* 8.5* 8.9*  HCT 25.5* 27.6* 25.5* 26.7* 27.4*  MCV 95.1 95.5 94.8 94.0 93.8  PLT 202 220 237 220 240   Cardiac Enzymes: No results for input(s): CKTOTAL, CKMB, CKMBINDEX, TROPONINI in the last 168 hours. BNP: BNP (last 3 results) No results for input(s): BNP in the last 8760 hours.  ProBNP (last 3 results) No results for input(s): PROBNP in the last 8760 hours.  CBG: Recent Labs  Lab 05/29/20 1210 05/29/20 1550 05/29/20 2128 05/30/20 0633 05/30/20 1055  GLUCAP 93 231* 245* 133* 285*       Signed:  Kayleen Memos, MD [facility] 05/30/2020, 11:38 AM

## 2020-05-30 NOTE — TOC Transition Note (Signed)
Transition of Care [facility]) - CM/SW Discharge Note   Patient Details  Name: Brandon Chaney MRN: [id number] Date of Birth: [birth year]  Transition of Care [facility] CM/SW Contact:  Curlene Labrum, RN Phone Number: 05/30/2020, 11:28 AM   Clinical Narrative:    Case management met with the patient at the bedside in regards to transitions of care to home today.  The patient is being discharged today after his wife gets off of work today and will be driving the patient home by car.  The patient lives out of town near [place], [place] and plans to follow up with the [facility].  Reminder follow up placed for PCP at [facility].  The patient currently has home O2 at 3 liters per minute during ambulation and 2 liters via [place] at night.  The patient will need a CPAP after sleep study set up after pulmonary appointment per Dr. Nevada Crane.   The patient is aware.  The patient will need printed discharge medications prescriptions so that he can fill the medications at home through the [place], [place] or local pharmacy at home.  The patient is currently on room air and will be discharging home with his wife today.   Final next level of care: Home/Self Care Barriers to Discharge: No Barriers Identified   Patient Goals and CMS Choice Patient states their goals for this hospitalization and ongoing recovery are:: Patient plans to go home with wife today and follow up with [facility] in [place], [place]. [place] Medicare.gov Compare Post Acute Care list provided to:: Patient Choice offered to / list presented to : Patient  Discharge Placement                       Discharge Plan and Services   Discharge Planning Services: CM Consult                                 Social Determinants of Health (SDOH) Interventions     Readmission Risk Interventions No flowsheet data found.

## 2020-05-30 NOTE — Plan of Care (Signed)
°  Problem: Education: °Goal: Knowledge of General Education information will improve °Description: Including pain rating scale, medication(s)/side effects and non-pharmacologic comfort measures °Outcome: Adequate for Discharge °  °Problem: Health Behavior/Discharge Planning: °Goal: Ability to manage health-related needs will improve °Outcome: Adequate for Discharge °  °Problem: Clinical Measurements: °Goal: Ability to maintain clinical measurements within normal limits will improve °Outcome: Adequate for Discharge °Goal: Will remain free from infection °Outcome: Adequate for Discharge °Goal: Diagnostic test results will improve °Outcome: Adequate for Discharge °Goal: Respiratory complications will improve °Outcome: Adequate for Discharge °Goal: Cardiovascular complication will be avoided °Outcome: Adequate for Discharge °  °Problem: Activity: °Goal: Risk for activity intolerance will decrease °Outcome: Adequate for Discharge °  °Problem: Nutrition: °Goal: Adequate nutrition will be maintained °Outcome: Adequate for Discharge °  °Problem: Coping: °Goal: Level of anxiety will decrease °Outcome: Adequate for Discharge °  °Problem: Elimination: °Goal: Will not experience complications related to bowel motility °Outcome: Adequate for Discharge °Goal: Will not experience complications related to urinary retention °Outcome: Adequate for Discharge °  °Problem: Pain Managment: °Goal: General experience of comfort will improve °Outcome: Adequate for Discharge °  °Problem: Safety: °Goal: Ability to remain free from injury will improve °Outcome: Adequate for Discharge °  °Problem: Skin Integrity: °Goal: Risk for impaired skin integrity will decrease °Outcome: Adequate for Discharge °  °Problem: Education: °Goal: Knowledge of disease and its progression will improve °Outcome: Adequate for Discharge °Goal: Individualized Educational Video(s) °Outcome: Adequate for Discharge °  °Problem: Fluid Volume: °Goal: Compliance with  measures to maintain balanced fluid volume will improve °Outcome: Adequate for Discharge °  °Problem: Health Behavior/Discharge Planning: °Goal: Ability to manage health-related needs will improve °Outcome: Adequate for Discharge °  °Problem: Nutritional: °Goal: Ability to make healthy dietary choices will improve °Outcome: Adequate for Discharge °  °Problem: Clinical Measurements: °Goal: Complications related to the disease process, condition or treatment will be avoided or minimized °Outcome: Adequate for Discharge °  °

## 2020-05-30 NOTE — Progress Notes (Signed)
Pt got discharged to home, discharge instructions provided and patient showed understanding to it, IV taken out,Telemonitor DC,pt left unit in wheelchair with all of the belongings accompanied with a family member (wife)  Neil Crouch, RN

## 2020-05-30 NOTE — Plan of Care (Signed)
  Problem: Education: Goal: Knowledge of disease and its progression will improve 05/30/2020 1047 by Neil Crouch, RN Outcome: Completed/Met 05/30/2020 1046 by Neil Crouch, RN Outcome: Adequate for Discharge Goal: Individualized Educational Video(s) 05/30/2020 1047 by Neil Crouch, RN Outcome: Completed/Met 05/30/2020 1046 by Neil Crouch, RN Outcome: Adequate for Discharge   Problem: Health Behavior/Discharge Planning: Goal: Ability to manage health-related needs will improve 05/30/2020 1047 by Neil Crouch, RN Outcome: Completed/Met 05/30/2020 1046 by Neil Crouch, RN Outcome: Adequate for Discharge   Problem: Nutritional: Goal: Ability to make healthy dietary choices will improve 05/30/2020 1047 by Neil Crouch, RN Outcome: Completed/Met 05/30/2020 1046 by Neil Crouch, RN Outcome: Adequate for Discharge   Problem: Clinical Measurements: Goal: Complications related to the disease process, condition or treatment will be avoided or minimized 05/30/2020 1047 by Neil Crouch, RN Outcome: Completed/Met 05/30/2020 1046 by Neil Crouch, RN Outcome: Adequate for Discharge   Problem: Clinical Measurements: Goal: Complications related to the disease process, condition or treatment will be avoided or minimized 05/30/2020 1047 by Neil Crouch, RN Outcome: Completed/Met 05/30/2020 1046 by Neil Crouch, RN Outcome: Adequate for Discharge

## 2020-05-30 NOTE — Progress Notes (Signed)
Pulmonary Progress Note  S: Reports minimal hemoptysis. Improved since admission.  Blood pressure (!) 112/91, pulse 89, temperature 98.5 F (36.9 C), temperature source Oral, resp. rate 20, height '5\' 9"'$  (1.753 m), weight 57 kg, SpO2 91 %.  Physical Exam: General: Well-appearing, no acute distress HENT: [place], AT, OP clear, MMM Eyes: EOMI, no scleral icterus Respiratory: Clear to auscultation bilaterally.  No crackles, wheezing or rales Cardiovascular: RRR, -M/R/G, no JVD Extremities:-Edema,-tenderness Neuro: AAO x4, CNII-XII grossly intact  Assessment  Hemoptysis secondary to multifocal pneumonia in setting of DAPT. 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  Currently on prednisone 40 mg. Continue prednisone taper as outlined in prior note.   Patient being discharged today  Follow-up with Dr. Erin Fulling  Care Time: 89 min  Rodman Pickle, M.D. [facility] Pulmonary/Critical Care Medicine 05/30/2020 1:44 PM

## 2020-06-13 ENCOUNTER — Encounter (HOSPITAL_COMMUNITY): Payer: Self-pay | Admitting: Internal Medicine

## 2020-06-13 ENCOUNTER — Inpatient Hospital Stay (HOSPITAL_COMMUNITY): Payer: Medicare Other

## 2020-06-13 ENCOUNTER — Inpatient Hospital Stay (HOSPITAL_COMMUNITY)
Admission: EM | Admit: 2020-06-13 | Discharge: 2020-06-17 | DRG: 291 | Disposition: A | Discharge status: Home Health | Payer: Medicare Other | Source: Other Acute Inpatient Hospital | Attending: Internal Medicine | Admitting: Internal Medicine

## 2020-06-13 ENCOUNTER — Inpatient Hospital Stay: Payer: Self-pay

## 2020-06-13 ENCOUNTER — Other Ambulatory Visit: Payer: Self-pay

## 2020-06-13 DIAGNOSIS — N2581 Secondary hyperparathyroidism of renal origin: Secondary | ICD-10-CM | POA: Diagnosis present

## 2020-06-13 DIAGNOSIS — Z89432 Acquired absence of left foot: Secondary | ICD-10-CM

## 2020-06-13 DIAGNOSIS — B0230 Zoster ocular disease, unspecified: Secondary | ICD-10-CM | POA: Insufficient documentation

## 2020-06-13 DIAGNOSIS — R042 Hemoptysis: Secondary | ICD-10-CM | POA: Diagnosis present

## 2020-06-13 DIAGNOSIS — Z681 Body mass index (BMI) 19 or less, adult: Secondary | ICD-10-CM

## 2020-06-13 DIAGNOSIS — I2781 Cor pulmonale (chronic): Secondary | ICD-10-CM | POA: Diagnosis present

## 2020-06-13 DIAGNOSIS — E1122 Type 2 diabetes mellitus with diabetic chronic kidney disease: Secondary | ICD-10-CM | POA: Diagnosis present

## 2020-06-13 DIAGNOSIS — Z79899 Other long term (current) drug therapy: Secondary | ICD-10-CM

## 2020-06-13 DIAGNOSIS — E119 Type 2 diabetes mellitus without complications: Secondary | ICD-10-CM

## 2020-06-13 DIAGNOSIS — R0602 Shortness of breath: Secondary | ICD-10-CM

## 2020-06-13 DIAGNOSIS — N186 End stage renal disease: Secondary | ICD-10-CM | POA: Diagnosis present

## 2020-06-13 DIAGNOSIS — J9611 Chronic respiratory failure with hypoxia: Secondary | ICD-10-CM | POA: Diagnosis present

## 2020-06-13 DIAGNOSIS — R0609 Other forms of dyspnea: Secondary | ICD-10-CM | POA: Diagnosis present

## 2020-06-13 DIAGNOSIS — Z89411 Acquired absence of right great toe: Secondary | ICD-10-CM | POA: Diagnosis not present

## 2020-06-13 DIAGNOSIS — E43 Unspecified severe protein-calorie malnutrition: Secondary | ICD-10-CM | POA: Diagnosis present

## 2020-06-13 DIAGNOSIS — Z7982 Long term (current) use of aspirin: Secondary | ICD-10-CM

## 2020-06-13 DIAGNOSIS — I132 Hypertensive heart and chronic kidney disease with heart failure and with stage 5 chronic kidney disease, or end stage renal disease: Principal | ICD-10-CM | POA: Diagnosis present

## 2020-06-13 DIAGNOSIS — I5043 Acute on chronic combined systolic (congestive) and diastolic (congestive) heart failure: Secondary | ICD-10-CM | POA: Diagnosis present

## 2020-06-13 DIAGNOSIS — I2510 Atherosclerotic heart disease of native coronary artery without angina pectoris: Secondary | ICD-10-CM | POA: Diagnosis present

## 2020-06-13 DIAGNOSIS — J849 Interstitial pulmonary disease, unspecified: Secondary | ICD-10-CM | POA: Diagnosis present

## 2020-06-13 DIAGNOSIS — E785 Hyperlipidemia, unspecified: Secondary | ICD-10-CM | POA: Diagnosis present

## 2020-06-13 DIAGNOSIS — J449 Chronic obstructive pulmonary disease, unspecified: Secondary | ICD-10-CM | POA: Diagnosis present

## 2020-06-13 DIAGNOSIS — Z7984 Long term (current) use of oral hypoglycemic drugs: Secondary | ICD-10-CM

## 2020-06-13 DIAGNOSIS — I25119 Atherosclerotic heart disease of native coronary artery with unspecified angina pectoris: Secondary | ICD-10-CM | POA: Diagnosis not present

## 2020-06-13 DIAGNOSIS — N189 Chronic kidney disease, unspecified: Secondary | ICD-10-CM

## 2020-06-13 DIAGNOSIS — J9601 Acute respiratory failure with hypoxia: Secondary | ICD-10-CM | POA: Diagnosis present

## 2020-06-13 DIAGNOSIS — E8889 Other specified metabolic disorders: Secondary | ICD-10-CM | POA: Diagnosis present

## 2020-06-13 DIAGNOSIS — D631 Anemia in chronic kidney disease: Secondary | ICD-10-CM | POA: Diagnosis present

## 2020-06-13 DIAGNOSIS — Z955 Presence of coronary angioplasty implant and graft: Secondary | ICD-10-CM

## 2020-06-13 DIAGNOSIS — I5021 Acute systolic (congestive) heart failure: Secondary | ICD-10-CM | POA: Diagnosis not present

## 2020-06-13 DIAGNOSIS — E1151 Type 2 diabetes mellitus with diabetic peripheral angiopathy without gangrene: Secondary | ICD-10-CM | POA: Diagnosis present

## 2020-06-13 DIAGNOSIS — Z992 Dependence on renal dialysis: Secondary | ICD-10-CM | POA: Diagnosis not present

## 2020-06-13 HISTORY — DX: Hyperlipidemia, unspecified: E78.5

## 2020-06-13 HISTORY — DX: Type 2 diabetes mellitus without complications: E11.9

## 2020-06-13 HISTORY — DX: Zoster ocular disease, unspecified: B02.30

## 2020-06-13 HISTORY — DX: Anemia in chronic kidney disease: N18.9

## 2020-06-13 HISTORY — DX: Anemia in chronic kidney disease: D63.1

## 2020-06-13 LAB — LIPID PANEL
Cholesterol: 126 mg/dL (ref 0–200)
HDL: 64 mg/dL (ref >40)
LDL Cholesterol: 45 mg/dL (ref 0–99)
Total CHOL/HDL Ratio: 2 RATIO
Triglycerides: 85 mg/dL (ref <150)
VLDL: 17 mg/dL (ref 0–40)

## 2020-06-13 LAB — CBC WITH DIFFERENTIAL/PLATELET
Abs Immature Granulocytes: 0.03 10*3/uL (ref 0.00–0.07)
Basophils Absolute: 0.1 10*3/uL (ref 0.0–0.1)
Basophils Relative: 1 %
Eosinophils Absolute: 0.2 10*3/uL (ref 0.0–0.5)
Eosinophils Relative: 3 %
HCT: 30 % — ABNORMAL LOW (ref 39.0–52.0)
Hemoglobin: 9 g/dL — ABNORMAL LOW (ref 13.0–17.0)
Immature Granulocytes: 0 %
Lymphocytes Relative: 13 %
Lymphs Abs: 0.9 10*3/uL (ref 0.7–4.0)
MCH: 29.3 pg (ref 26.0–34.0)
MCHC: 30 g/dL (ref 30.0–36.0)
MCV: 97.7 fL (ref 80.0–100.0)
Monocytes Absolute: 0.3 10*3/uL (ref 0.1–1.0)
Monocytes Relative: 4 %
Neutro Abs: 5.3 10*3/uL (ref 1.7–7.7)
Neutrophils Relative %: 79 %
Platelets: 170 10*3/uL (ref 150–400)
RBC: 3.07 MIL/uL — ABNORMAL LOW (ref 4.22–5.81)
RDW: 19 % — ABNORMAL HIGH (ref 11.5–15.5)
WBC: 6.7 10*3/uL (ref 4.0–10.5)
nRBC: 0 % (ref 0.0–0.2)

## 2020-06-13 LAB — COMPREHENSIVE METABOLIC PANEL
ALT: 37 U/L (ref 0–44)
AST: 34 U/L (ref 15–41)
Albumin: 2.9 g/dL — ABNORMAL LOW (ref 3.5–5.0)
Alkaline Phosphatase: 123 U/L (ref 38–126)
Anion gap: 12 (ref 5–15)
BUN: 44 mg/dL — ABNORMAL HIGH (ref 8–23)
CO2: 27 mmol/L (ref 22–32)
Calcium: 7.8 mg/dL — ABNORMAL LOW (ref 8.9–10.3)
Chloride: 98 mmol/L (ref 98–111)
Creatinine, Ser: 4.5 mg/dL — ABNORMAL HIGH (ref 0.61–1.24)
GFR, Estimated: 14 mL/min — ABNORMAL LOW (ref >60)
Glucose, Bld: 175 mg/dL — ABNORMAL HIGH (ref 70–99)
Potassium: 3.4 mmol/L — ABNORMAL LOW (ref 3.5–5.1)
Sodium: 137 mmol/L (ref 135–145)
Total Bilirubin: 1.3 mg/dL — ABNORMAL HIGH (ref 0.3–1.2)
Total Protein: 6.6 g/dL (ref 6.5–8.1)

## 2020-06-13 LAB — TSH: TSH: 0.991 u[IU]/mL (ref 0.350–4.500)

## 2020-06-13 LAB — TROPONIN I (HIGH SENSITIVITY)
Troponin I (High Sensitivity): 29 ng/L — ABNORMAL HIGH (ref <18)
Troponin I (High Sensitivity): 31 ng/L — ABNORMAL HIGH (ref <18)

## 2020-06-13 LAB — BRAIN NATRIURETIC PEPTIDE: B Natriuretic Peptide: >4500 pg/mL — ABNORMAL HIGH (ref 0.0–100.0)

## 2020-06-13 LAB — GLUCOSE, CAPILLARY: Glucose-Capillary: 221 mg/dL — ABNORMAL HIGH (ref 70–99)

## 2020-06-13 MED ORDER — ONDANSETRON HCL 4 MG PO TABS: 4.0000 mg | ORAL_TABLET | Freq: Four times a day (QID) | ORAL | Status: DC | PRN

## 2020-06-13 MED ORDER — CINACALCET HCL 30 MG PO TABS
30.0000 mg | ORAL_TABLET | Freq: Every day | ORAL | Status: DC
  Administered 2020-06-14 – 2020-06-17 (×4): 30 mg via ORAL
  Filled 2020-06-13 (×4): qty 1

## 2020-06-13 MED ORDER — GUAIFENESIN ER 600 MG PO TB12
1200.0000 mg | ORAL_TABLET | Freq: Every day | ORAL | Status: DC
  Administered 2020-06-13 – 2020-06-17 (×5): 1200 mg via ORAL
  Filled 2020-06-13 (×5): qty 2

## 2020-06-13 MED ORDER — ALBUTEROL SULFATE (2.5 MG/3ML) 0.083% IN NEBU: 2.5000 mg | INHALATION_SOLUTION | Freq: Four times a day (QID) | RESPIRATORY_TRACT | Status: DC | PRN

## 2020-06-13 MED ORDER — SODIUM CHLORIDE 0.9% FLUSH
3.0000 mL | Freq: Two times a day (BID) | INTRAVENOUS | Status: DC
  Administered 2020-06-13 – 2020-06-14 (×2): 3 mL via INTRAVENOUS

## 2020-06-13 MED ORDER — TIOTROPIUM BROMIDE MONOHYDRATE 18 MCG IN CAPS
18.0000 ug | ORAL_CAPSULE | Freq: Every day | RESPIRATORY_TRACT | Status: DC
  Administered 2020-06-14 – 2020-06-16 (×3): 18 ug via RESPIRATORY_TRACT
  Filled 2020-06-13: qty 5

## 2020-06-13 MED ORDER — PREDNISONE 10 MG PO TABS
10.0000 mg | ORAL_TABLET | Freq: Every day | ORAL | Status: AC
  Administered 2020-06-13 – 2020-06-16 (×4): 10 mg via ORAL
  Filled 2020-06-13 (×4): qty 1

## 2020-06-13 MED ORDER — TICAGRELOR 90 MG PO TABS
90.0000 mg | ORAL_TABLET | Freq: Two times a day (BID) | ORAL | Status: DC
  Administered 2020-06-14 – 2020-06-17 (×7): 90 mg via ORAL
  Filled 2020-06-13 (×7): qty 1

## 2020-06-13 MED ORDER — ONDANSETRON HCL 4 MG/2ML IJ SOLN: 4.0000 mg | Freq: Four times a day (QID) | INTRAMUSCULAR | Status: DC | PRN

## 2020-06-13 MED ORDER — HYDROXYZINE HCL 25 MG PO TABS
25.0000 mg | ORAL_TABLET | Freq: Three times a day (TID) | ORAL | Status: DC | PRN
  Administered 2020-06-13 – 2020-06-16 (×5): 25 mg via ORAL
  Filled 2020-06-13 (×5): qty 1

## 2020-06-13 MED ORDER — BISACODYL 5 MG PO TBEC: 5.0000 mg | DELAYED_RELEASE_TABLET | Freq: Every day | ORAL | Status: DC | PRN

## 2020-06-13 MED ORDER — SORBITOL 70 % SOLN
30.0000 mL | Status: DC | PRN
Start: 2020-06-13 — End: 2020-06-13

## 2020-06-13 MED ORDER — POLYETHYLENE GLYCOL 3350 17 G PO PACK
17.0000 g | PACK | Freq: Every day | ORAL | Status: DC | PRN
Start: 2020-06-13 — End: 2020-06-17

## 2020-06-13 MED ORDER — ASPIRIN EC 81 MG PO TBEC
81.0000 mg | DELAYED_RELEASE_TABLET | Freq: Every day | ORAL | Status: DC
  Administered 2020-06-14 – 2020-06-17 (×4): 81 mg via ORAL
  Filled 2020-06-13 (×4): qty 1

## 2020-06-13 MED ORDER — HEPARIN SODIUM (PORCINE) 5000 UNIT/ML IJ SOLN
5000.0000 [IU] | Freq: Three times a day (TID) | INTRAMUSCULAR | Status: DC
  Administered 2020-06-13 – 2020-06-17 (×12): 5000 [IU] via SUBCUTANEOUS
  Filled 2020-06-13 (×13): qty 1

## 2020-06-13 MED ORDER — FUROSEMIDE 10 MG/ML IJ SOLN
40.0000 mg | Freq: Two times a day (BID) | INTRAMUSCULAR | Status: DC
  Administered 2020-06-14 (×2): 40 mg via INTRAVENOUS
  Filled 2020-06-13 (×2): qty 4

## 2020-06-13 MED ORDER — DOCUSATE SODIUM 100 MG PO CAPS
100.0000 mg | ORAL_CAPSULE | Freq: Two times a day (BID) | ORAL | Status: DC
  Administered 2020-06-13 – 2020-06-17 (×8): 100 mg via ORAL
  Filled 2020-06-13 (×8): qty 1

## 2020-06-13 MED ORDER — TAMSULOSIN HCL 0.4 MG PO CAPS
0.4000 mg | ORAL_CAPSULE | Freq: Every day | ORAL | Status: DC
  Administered 2020-06-14 – 2020-06-17 (×4): 0.4 mg via ORAL
  Filled 2020-06-13 (×4): qty 1

## 2020-06-13 MED ORDER — RENA-VITE PO TABS
1.0000 | ORAL_TABLET | Freq: Every day | ORAL | Status: DC
  Administered 2020-06-14 – 2020-06-16 (×4): 1 via ORAL
  Filled 2020-06-13 (×4): qty 1

## 2020-06-13 MED ORDER — MIRTAZAPINE 15 MG PO TABS
15.0000 mg | ORAL_TABLET | Freq: Every day | ORAL | Status: DC
  Administered 2020-06-14 – 2020-06-16 (×4): 15 mg via ORAL
  Filled 2020-06-13 (×4): qty 1

## 2020-06-13 MED ORDER — ACETAMINOPHEN 325 MG PO TABS
650.0000 mg | ORAL_TABLET | Freq: Four times a day (QID) | ORAL | Status: DC | PRN
  Filled 2020-06-13: qty 2

## 2020-06-13 MED ORDER — INSULIN ASPART 100 UNIT/ML ~~LOC~~ SOLN: 0.0000 [IU] | Freq: Three times a day (TID) | SUBCUTANEOUS | Status: DC

## 2020-06-13 MED ORDER — SORBITOL 70 % SOLN: 30.0000 mL | Status: DC | PRN

## 2020-06-13 MED ORDER — TRAZODONE HCL 50 MG PO TABS: 25.0000 mg | ORAL_TABLET | Freq: Every evening | ORAL | Status: DC | PRN

## 2020-06-13 MED ORDER — CHLORHEXIDINE GLUCONATE CLOTH 2 % EX PADS
6.0000 | MEDICATED_PAD | Freq: Every day | CUTANEOUS | Status: DC
  Administered 2020-06-14: 6 via TOPICAL

## 2020-06-13 MED ORDER — ATORVASTATIN CALCIUM 80 MG PO TABS
80.0000 mg | ORAL_TABLET | Freq: Every day | ORAL | Status: DC
  Administered 2020-06-14 – 2020-06-17 (×4): 80 mg via ORAL
  Filled 2020-06-13 (×4): qty 1

## 2020-06-13 MED ORDER — FAMOTIDINE 20 MG PO TABS
20.0000 mg | ORAL_TABLET | ORAL | Status: DC
  Administered 2020-06-15 – 2020-06-17 (×2): 20 mg via ORAL
  Filled 2020-06-13 (×2): qty 1

## 2020-06-13 MED ORDER — CALCIUM CARBONATE ANTACID 1250 MG/5ML PO SUSP
500.0000 mg | Freq: Four times a day (QID) | ORAL | Status: DC | PRN
Start: 2020-06-13 — End: 2020-06-17
  Administered 2020-06-14: 500 mg via ORAL
  Filled 2020-06-13 (×2): qty 5

## 2020-06-13 MED ORDER — NEPRO/CARBSTEADY PO LIQD
237.0000 mL | Freq: Three times a day (TID) | ORAL | Status: DC | PRN
Start: 2020-06-13 — End: 2020-06-17

## 2020-06-13 MED ORDER — DOCUSATE SODIUM 283 MG RE ENEM
1.0000 | ENEMA | RECTAL | Status: DC | PRN
  Filled 2020-06-13: qty 1

## 2020-06-13 MED ORDER — OXYCODONE HCL 5 MG PO TABS
5.0000 mg | ORAL_TABLET | ORAL | Status: DC | PRN
  Administered 2020-06-13 – 2020-06-14 (×2): 5 mg via ORAL
  Filled 2020-06-13 (×2): qty 1

## 2020-06-13 MED ORDER — ACETAMINOPHEN 650 MG RE SUPP: 650.0000 mg | Freq: Four times a day (QID) | RECTAL | Status: DC | PRN

## 2020-06-13 MED ORDER — ZOLPIDEM TARTRATE 5 MG PO TABS
5.0000 mg | ORAL_TABLET | Freq: Every evening | ORAL | Status: DC | PRN
  Administered 2020-06-14 – 2020-06-16 (×3): 5 mg via ORAL
  Filled 2020-06-13 (×3): qty 1

## 2020-06-13 MED ORDER — CAMPHOR-MENTHOL 0.5-0.5 % EX LOTN
1.0000 "application " | TOPICAL_LOTION | Freq: Three times a day (TID) | CUTANEOUS | Status: DC | PRN
  Administered 2020-06-15: 1 via TOPICAL
  Filled 2020-06-13: qty 222

## 2020-06-13 MED ORDER — PREDNISONE 5 MG PO TABS
5.0000 mg | ORAL_TABLET | Freq: Every day | ORAL | Status: DC
  Administered 2020-06-17: 5 mg via ORAL
  Filled 2020-06-13 (×2): qty 1

## 2020-06-13 MED ORDER — HYDRALAZINE HCL 20 MG/ML IJ SOLN: 5.0000 mg | INTRAMUSCULAR | Status: DC | PRN

## 2020-06-13 NOTE — H&P (Addendum)
History and Physical    [name] DPO:[id number] DOB: 05/21/1955 DOA: 06/13/2020  PCP: [facility]:  Cardiology here until able to be seen through the [place] Patient coming from:  Home - lives with wife; NOK: Wife, Deneise Lever [phone]  Chief Complaint: SOB  HPI: Brandon Chaney is a 65 y.o. male with medical history significant of ESRD on MWF HD; COPD; CAD s/p stent; and recent hospitalization for PNA and symptomatic anemia and subsequent 2nd admission here from 3/1-12.  He did hospital f/u with his PCP yesterday.  He heard wheezing and noises in his chest and CXR led him to send him back to the ER.  He has been coughing up blood for a year.  He was discharged on 3/12 and he started with increased coughing, difficulty sleeping at night, oxygen levels dropping at night "real low" (he thinks as low as 20s?).  He came off the O2 but has needed it the last couple of days.  His sleep study is pending.  No fevers.  +night sweats.  Sputum is productive of blood ("quite a bit") or white/brown/yellow sputum.  No LE edema.  +weight gain "cause I had lost weight" - 123 to 137 lb.  +orthopnea, feels like he is drowning when he lies flat.  No sick contacts.  He was dialyzed yesterday, thinks he got off 30 minutes early.  He is consistent with attending HD treatments.  In review of his last hospitalization, he was thought to have multifactorial respiratory failure associated with volume overload (in the setting of ESRD), high-output heart failure, anemia, and ILD.  There was concern for ILD associated with an autoimmune cause and whereas he was previously treated with antibiotics for Citro  on this occasion he was treated with steroids with taper.  There was some concern for mild aspiration.  He had a pleural effusion that was treated with thoracentesis effectively.  He previously had bronchoscopy at [facility] and BAL x 2 was negative for Citrobacter but lack of growth was thought to be  related to DAPT.  Anemia is related to ESRD, for which he is on MWF HD.  He has had recent herpes ophthalmicus Rosita Kea) which had been fully treated; he has remaining light sensitivity.  He also has ischemic cardiomyopathy with stent placement in 02/2020 and subsequent decrease in EF to 20-25%.     ED Course:  [facility] to [facility] transfer, per Dr. Myna Hidalgo:  65 yom with ESRD, CAD, EF 20-25%, COPD, chronic 2-3 Lpm O2 requirement, treated for pneumonia recently and now p/w progressive SOB and orthopnea since hospital discharge 2 weeks ago. He is afebrile, saturating okay on his usual O2, but dyspneic with minimal exertion. CXR with diffuse b/l airspace disease and small pleural effusions. Troponin 90s and flat. pro-BNP beyond quantifiable limit. WBC normal. Hgb stable at 9.7. He had HD just prior to arrival in ED, diuresed and had slight improvement after IV Lasix in ED, but still SOB. Transfer requested d/t no dialysis at their facility.     Review of Systems: As per HPI; otherwise review of systems reviewed and negative.   Ambulatory Status:  Ambulates with a cane, walker, or uses a motorized chair  COVID Vaccine Status:  Complete, no booster  Past Medical History:  Diagnosis Date  . Anemia due to chronic kidney disease 06/13/2020  . CAD (coronary artery disease) 05/19/2020  . COPD (chronic obstructive pulmonary disease) ([place]) 05/19/2020  . Dyslipidemia 06/13/2020  . ESRD (end stage renal disease) ([place])  05/19/2020  . Herpes zoster ophthalmicus, right eye   . Ophthalmic herpes zoster infection 06/13/2020  . Type II diabetes mellitus, well controlled ([place]) 06/13/2020    Past Surgical History:  Procedure Laterality Date  . ARTERIOVENOUS GRAFT PLACEMENT Right   . BRONCHIAL WASHINGS Right 05/22/2020   Procedure: BRONCHIAL WASHINGS;  Surgeon: Spero Geralds, MD;  Location: [facility] ENDOSCOPY;  Service: Pulmonary;  Laterality: Right;  . CORONARY ANGIOPLASTY    . IR THORACENTESIS ASP PLEURAL SPACE W/IMG GUIDE   05/20/2020  . VIDEO BRONCHOSCOPY N/A 05/22/2020   Procedure: VIDEO BRONCHOSCOPY WITHOUT FLUORO;  Surgeon: Spero Geralds, MD;  Location: [facility] ENDOSCOPY;  Service: Pulmonary;  Laterality: N/A;    Social History   Socioeconomic History  . Marital status: Married    Spouse name: Not on file  . Number of children: Not on file  . Years of education: Not on file  . Highest education level: Not on file  Occupational History  . Occupation: Environmental education officer  Tobacco Use  . Smoking status: Never Smoker  . Smokeless tobacco: Never Used  . Tobacco comment: none in 30-40 years  Substance and Sexual Activity  . Alcohol use: Not Currently    Comment: none in 30-40 years  . Drug use: Not Currently    Types: Marijuana    Comment: many years ago  . Sexual activity: Not on file  Other Topics Concern  . Not on file  Social History Narrative  . Not on file   Social Determinants of Health   Financial Resource Strain: Not on file  Food Insecurity: Not on file  Transportation Needs: Not on file  Physical Activity: Not on file  Stress: Not on file  Social Connections: Not on file  Intimate Partner Violence: Not on file    Allergies  Allergen Reactions  . Duragesic-100 [Fentanyl] Nausea Only  . Sulfa Antibiotics     Heart flutters/itching    Family History  Family history unknown: Yes    Prior to Admission medications   Medication Sig Start Date End Date Taking? Authorizing Provider  acidophilus (RISAQUAD) CAPS capsule Take 1 capsule by mouth daily.    [provider]  albuterol (PROVENTIL) (2.5 MG/3ML) 0.083% nebulizer solution Take 2.5 mg by nebulization every 6 (six) hours as needed for wheezing or shortness of breath.    [provider]  aspirin EC 81 MG tablet Take 81 mg by mouth daily. Swallow whole.    [provider]  atorvastatin (LIPITOR) 80 MG tablet Take 80 mg by mouth daily.    [provider]  cinacalcet (SENSIPAR) 30 MG tablet Take 30 mg by mouth  daily.    [provider]  diphenhydrAMINE (BENADRYL) 25 mg capsule Take 25 mg by mouth every 6 (six) hours as needed.    [provider]  famotidine (PEPCID) 20 MG tablet Take 20 mg by mouth See admin instructions. Takes 1 tablet 3 times a week    [provider]  ferrous sulfate 325 (65 FE) MG tablet Take 325 mg by mouth daily with breakfast.    [provider]  glipiZIDE (GLUCOTROL) 5 MG tablet Take 0.5 tablets (2.5 mg total) by mouth daily before breakfast. 05/31/20 11/27/20  Kayleen Memos, DO  multivitamin (RENA-VIT) TABS tablet Take 1 tablet by mouth at bedtime. 05/30/20 11/26/20  Kayleen Memos, DO  predniSONE (DELTASONE) 5 MG tablet Take 1 tablet (5 mg total) by mouth daily with breakfast. --> Take 78m daily through 06/02/20, then, -->  Take 3m daily from 06/03/20 to 06/09/20, then, --> Take 136mdaily from 06/10/20 to 06/16/20, then, --> Take 77m97maily from 06/17/20 to 06/23/20. 05/30/20   HalKayleen MemosO  tamsulosin (FLOMAX) 0.4 MG CAPS capsule Take 0.4 mg by mouth daily.    [provider]  ticagrelor (BRILINTA) 90 MG TABS tablet Take 90 mg by mouth 2 (two) times daily.    [provider]  tiotropium (SPIRIVA) 18 MCG inhalation capsule Place 18 mcg into inhaler and inhale daily.    [provider]    Physical Exam: Vitals:   06/13/20 1113 06/13/20 1123  BP:  (!) 155/66  Pulse:  94  Resp:  16  Temp:  98.8 F (37.1 C)  SpO2:  94%  Weight: 60.6 kg   Height: _0  (1.753 m)      . General:  Appears calm and comfortable and is in NAD, on 3L [place] O2 (which is his home O2) . Eyes:   EOMI, normal lids, iris; dark glasses in place for exam but he was texting on his phone without them throughout the history portion . ENT:  grossly normal hearing, lips & tongue, mmm; edentulous . Neck:  no LAD, masses or thyromegaly . Cardiovascular:  RRR, no m/r/g. No LE edema.  . RMarland Kitchenspiratory:   Diminished breath sounds in L > R bases..  Normal  respiratory effort. . Abdomen:  soft, NT, ND, NABS . Skin:  no rash or induration seen on limited exam . Musculoskeletal:  grossly normal tone BUE/BLE, good ROM, no bony abnormality . Lower extremity:  No LE edema.  Limited foot exam with no ulcerations.  2+ distal pulses.  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 Psychiatric:  grossly normal mood and affect, speech fluent and appropriate, AOx3 . Neurologic:  CN 2-12 grossly intact, moves all extremities in coordinated fashion    Radiological Exams on Admission: Independently reviewed - see discussion in A/P where applicable  No results found.  [facility].  NSR with rate 83; nonspecific ST changes with no evidence of acute ischemia   Labs on Admission: I have personally reviewed the available labs and imaging studies at the time of the admission.  [facility].1/GFR <15 Glucose 150 HS troponin 99, 100 Pro-BNP >35000 WBC 5.8 Hgb 9.2 UA: 1+ glucose, 3+ protein COVID negative    Assessment/Plan Principal Problem:   Other forms of dyspnea Active Problems:   ESRD (end stage renal disease) (HCC)   COPD (chronic obstructive pulmonary disease) (HCC)   CAD (coronary artery disease)   Hemoptysis   Acute on chronic combined systolic and diastolic CHF (congestive heart failure) (HCC)   Dyslipidemia   Type II diabetes mellitus, well controlled (HCC)   Anemia due to chronic kidney disease   Ophthalmic herpes zoster infection    Multifactorial dyspnea -Patient is on home 2-3 L [place] O2 and remains currently on 3L -He reports that his home O2 sats were as low as the 20s and that he had weaned off O2 but then had to restart -His primary complaints appear to be cough, SOB, and hemoptysis -He does have known severe systolic dysfunction but should be volume controlled with HD (?need for lower EDW) -He also has h/o COPD, ILD, and hemoptysis (see below)  Acute on  chronic combined CHF -Patient with known h/o chronic and worsening CHF with EF down from 45 to 25% on last  check (not in our system) presenting with worsening SOB and hypoxia (reported to the 20s by patient, but stable on 3L currently) -CXR reported to show B airspace disease and small pleural effusions; he brought his disc and this has been sent to radiology for scanning -Markedly elevated pro-BNP at OSH; repeating BNP here -With elevated BNP and abnl CXR, acute decompensated CHF seems probable as at least contributing diagnosis -Heart failure in patients with ESRD is generally managed with HD; he may need a lower EDW and nephrology has been notified of admission -Will admit with telemetry -Will request echocardiogram to be performed within our system -CHF order set utilized; may need CHF team consult but will hold until Echo results are available -Was given Lasix 40 mg IV BID -Continue [place] O2 for now -Compare troponin to prior and to [facility] values -Check BNP  ESRD on HD -Patient on chronic MWF HD -Nephrology prn order set utilized -Nephrology notified that patient will need HD  -Continue Sensipar, Rena-Vit  Persistent hemoptysis -He has had ongoing hemoptysis -He previously had Citrobacter infection and this was thought to be the cause, but this has been treated and the hemoptysis persists -There was supposition that this may be due to pulmonary capillaritis or diffuse alveolar hemorrhage related to recent PNA as well as DAPT use -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 Valeta Harms from pulmonology - recommends consideration of stopping anti-platelet therapy -If documented hemoptysis while inpatient, will need to notify Dr. Valeta Harms via [place] and he can be seen - but there is little  to add at this time given thorough evaluation and findings during his recent hospitalization -Also with prior concern for aspiration risk so will give diet but also request SLP swallow evaluation again  CAD/PVD -Patient is s/p stent in 02/2020 -He is also s/p B toe/foot amputations -He is on ASA and Brilinta -It may be reasonable to consider stopping ASA and continuing Brilinta if ongoing hemoptysis is documented  HLD -Continue Lipitor -Check lipids  DM -Good control based on recent A1c <6 -hold Glucotrol -Cover with very sensitive-scale SSI  Anemia -Thought to be due to ESRD -Appears to be stable at this time despite persistent hemoptysis  COPD -No obvious exacerbation at this time -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 She has been fully vaccinated against COVID-19.   DVT prophylaxis: Heparin  Code Status:  Full - confirmed with patient Family Communication: None present Disposition Plan:  The patient is from: home  Anticipated d/c is to: home  Anticipated d/c date will depend on clinical response to treatment, likely 2-4 days  Patient is currently: acutely ill Consults called: Nephrology; pulmonology by telephone only; Nutrition/PT/OT/ST/TOC team Admission status: Admit - It is my clinical opinion that admission to INPATIENT is reasonable and necessary because this patient will require at least 2 midnights in  the hospital to treat this condition based on the medical complexity of the  problems presented.  Given the aforementioned information, the predictability of an adverse outcome is felt to be significant.    Karmen Bongo MD [facility]   How to contact the [facility] Attending or Consulting provider [place] or covering provider during after hours [place], for this patient?  1. Check the care team in [facility] and look for a) attending/consulting TRH provider listed and b) the [facility] team listed 2. Log into [email or web address] and use [place]'s universal password to access. If you do not have the password, please contact the hospital operator. 3. Locate the [facility] provider you are looking for under [facility] and page to a number that you can be directly reached. 4. If you still have difficulty reaching the provider, please page the [facility] (Director on Call) for the Hospitalists listed on amion for assistance.   06/13/2020, 2:24 PM

## 2020-06-13 NOTE — Consult Note (Signed)
[facility] Renal Consultation Note    Indication for Consultation:  Management of ESRD/hemodialysis; anemia, hypertension/volume and secondary hyperparathyroidism  HPI: Brandon Chaney is a 65 y.o. male with ESRD on HD, DMT2, CAD s/p stent, HFrEF,  COPD on home O2. Recent [place] admission 3/1-3/12/22 with dyspnea, PNA  pleural effusion s/p thoracentesis and symptomatic anemia. Now admitted again with worsening dyspnea and cough. Per notes CXR from outside hospital system showing bilateral airspace disease and small pleural effusions.   Seen and examined at bedside. He endorses worsening dyspnea, orthopnea, productive cough with some epidsodes of hemoptysis. He says his breathing has been terrible for "a year".   Dialysis at [facility] MWF via AVF. Usually compliant with HD. Last dialysis was Friday but left early d/t doctor appointment. He has been asking to challenge his dry weight at dialysis.   Past Medical History:  Diagnosis Date  . Anemia due to chronic kidney disease 06/13/2020  . CAD (coronary artery disease) 05/19/2020  . COPD (chronic obstructive pulmonary disease) ([place]) 05/19/2020  . Dyslipidemia 06/13/2020  . ESRD (end stage renal disease) ([place]) 05/19/2020  . Herpes zoster ophthalmicus, right eye   . Ophthalmic herpes zoster infection 06/13/2020  . Type II diabetes mellitus, well controlled ([place]) 06/13/2020   Past Surgical History:  Procedure Laterality Date  . ARTERIOVENOUS GRAFT PLACEMENT Right   . BRONCHIAL WASHINGS Right 05/22/2020   Procedure: BRONCHIAL WASHINGS;  Surgeon: Spero Geralds, MD;  Location: [facility] ENDOSCOPY;  Service: Pulmonary;  Laterality: Right;  . CORONARY ANGIOPLASTY    . IR THORACENTESIS ASP PLEURAL SPACE W/IMG GUIDE  05/20/2020  . VIDEO BRONCHOSCOPY N/A 05/22/2020   Procedure: VIDEO BRONCHOSCOPY WITHOUT FLUORO;  Surgeon: Spero Geralds, MD;  Location: [facility] ENDOSCOPY;  Service: Pulmonary;  Laterality: N/A;   Family History  Family history  unknown: Yes   Social History:  reports that he has never smoked. He has never used smokeless tobacco. He reports previous alcohol use. He reports previous drug use. Drug: Marijuana. Allergies  Allergen Reactions  . Duragesic-100 [Fentanyl] Nausea Only  . Sulfa Antibiotics     Heart flutters/itching   Prior to Admission medications   Medication Sig Start Date End Date Taking? Authorizing Provider  acidophilus (RISAQUAD) CAPS capsule Take 1 capsule by mouth daily.   Yes [provider]  albuterol (PROVENTIL) (2.5 MG/3ML) 0.083% nebulizer solution Take 2.5 mg by nebulization every 6 (six) hours as needed for wheezing or shortness of breath.   Yes [provider]  aspirin EC 81 MG tablet Take 81 mg by mouth daily. Swallow whole.   Yes [provider]  atorvastatin (LIPITOR) 80 MG tablet Take 80 mg by mouth daily.   Yes [provider]  cinacalcet (SENSIPAR) 30 MG tablet Take 30 mg by mouth daily.   Yes [provider]  diphenhydrAMINE (BENADRYL) 25 mg capsule Take 25 mg by mouth every 6 (six) hours as needed for itching.   Yes [provider]  famotidine (PEPCID) 20 MG tablet Take 20 mg by mouth every Monday, Wednesday, and Friday.   Yes [provider]  ferrous sulfate 325 (65 FE) MG tablet Take 325 mg by mouth daily with breakfast.   Yes [provider]  glipiZIDE (GLUCOTROL) 5 MG tablet Take 0.5 tablets (2.5 mg total) by mouth daily before breakfast. 05/31/20 11/27/20 Yes Hall, Carole N, DO  guaiFENesin (MUCINEX) 600 MG 12 hr tablet Take 1,200 mg by mouth daily. 06/12/20  Yes [provider]  mirtazapine (REMERON) 15 MG tablet Take 15 mg by mouth at bedtime. 06/12/20  Yes [provider]  multivitamin (RENA-VIT) TABS tablet Take 1 tablet by mouth at bedtime. 05/30/20 11/26/20 Yes Kayleen Memos, DO  predniSONE (DELTASONE) 5 MG tablet Take 1 tablet (5 mg total) by mouth daily with breakfast. --> Take '40mg'$  daily  through 06/02/20, then, --> Take '20mg'$  daily from 06/03/20 to 06/09/20, then, --> Take '10mg'$  daily from 06/10/20 to 06/16/20, then, --> Take '5mg'$  daily from 06/17/20 to 06/23/20. 05/30/20  Yes Kayleen Memos, DO  tamsulosin (FLOMAX) 0.4 MG CAPS capsule Take 0.4 mg by mouth daily.   Yes [provider]  ticagrelor (BRILINTA) 90 MG TABS tablet Take 90 mg by mouth 2 (two) times daily.   Yes [provider]  tiotropium (SPIRIVA) 18 MCG inhalation capsule Place 18 mcg into inhaler and inhale daily.   Yes [provider]   Current Facility-Administered Medications  Medication Dose Route Frequency Provider Last Rate Last Admin  . acetaminophen (TYLENOL) tablet 650 mg  650 mg Oral Q6H PRN Karmen Bongo, MD       Or  . acetaminophen (TYLENOL) suppository 650 mg  650 mg Rectal Q6H PRN Karmen Bongo, MD      . albuterol (PROVENTIL) (2.5 MG/3ML) 0.083% nebulizer solution 2.5 mg  2.5 mg Nebulization Q6H PRN Karmen Bongo, MD      . Derrill Memo ON 06/14/2020] aspirin EC tablet 81 mg  81 mg Oral Daily Karmen Bongo, MD      . Derrill Memo ON 06/14/2020] atorvastatin (LIPITOR) tablet 80 mg  80 mg Oral Daily Karmen Bongo, MD      . bisacodyl (DULCOLAX) EC tablet 5 mg  5 mg Oral Daily PRN Karmen Bongo, MD      . calcium carbonate (dosed in mg elemental calcium) suspension 500 mg of elemental calcium  500 mg of elemental calcium Oral Q6H PRN Karmen Bongo, MD      . camphor-menthol [facility]) lotion 1 application  1 application Topical [id number] PRN Karmen Bongo, MD       And  . hydrOXYzine (ATARAX/VISTARIL) tablet 25 mg  25 mg Oral Q8H PRN Karmen Bongo, MD   25 mg at 06/13/20 1428  . [START ON 06/14/2020] Chlorhexidine Gluconate Cloth 2 % PADS 6 each  6 each Topical Q0600 Lynnda Child, PA-C      . [START ON 06/14/2020] cinacalcet (SENSIPAR) tablet 30 mg  30 mg Oral Q breakfast Karmen Bongo, MD      . docusate sodium (COLACE) capsule 100 mg  100 mg Oral BID Karmen Bongo, MD   100 mg  at 06/13/20 1421  . docusate sodium (ENEMEEZ) enema 283 mg  1 enema Rectal PRN Karmen Bongo, MD      . Derrill Memo ON 06/15/2020] famotidine (PEPCID) tablet 20 mg  20 mg Oral Once per day on Mon Wed Fri Yates, Jennifer, MD      . feeding supplement (NEPRO CARB STEADY) liquid 237 mL  237 mL Oral TID PRN Karmen Bongo, MD      . furosemide (LASIX) injection 40 mg  40 mg Intravenous BID Karmen Bongo, MD      . guaiFENesin [facility]) 12 hr tablet 1,200 mg  1,200 mg Oral Daily Karmen Bongo, MD   1,200 mg at 06/13/20 1421  . heparin injection 5,000 Units  5,000 Units Subcutaneous Lynne Logan, MD   5,000 Units at 06/13/20 1421  . hydrALAZINE (APRESOLINE) injection 5 mg  5 mg Intravenous  Q4H PRN Karmen Bongo, MD      . insulin aspart (novoLOG) injection 0-6 Units  0-6 Units Subcutaneous TID WC Karmen Bongo, MD      . mirtazapine (REMERON) tablet 15 mg  15 mg Oral QHS Karmen Bongo, MD      . multivitamin (RENA-VIT) tablet 1 tablet  1 tablet Oral QHS Karmen Bongo, MD      . ondansetron [facility]) tablet 4 mg  4 mg Oral Q6H PRN Karmen Bongo, MD       Or  . ondansetron [facility]) injection 4 mg  4 mg Intravenous Q6H PRN Karmen Bongo, MD      . oxyCODONE (Oxy IR/ROXICODONE) immediate release tablet 5 mg  5 mg Oral Q4H PRN Karmen Bongo, MD   5 mg at 06/13/20 1428  . polyethylene glycol (MIRALAX / GLYCOLAX) packet 17 g  17 g Oral Daily PRN Karmen Bongo, MD      . predniSONE (DELTASONE) tablet 10 mg  10 mg Oral Q breakfast Karmen Bongo, MD   10 mg at 06/13/20 1421  . [START ON 06/17/2020] predniSONE (DELTASONE) tablet 5 mg  5 mg Oral Q breakfast Karmen Bongo, MD      . sodium chloride flush (NS) 0.9 % injection 3 mL  3 mL Intravenous Q12H Karmen Bongo, MD   3 mL at 06/13/20 1421  . sorbitol 70 % solution 30 mL  30 mL Oral PRN Karmen Bongo, MD      . Derrill Memo ON 06/14/2020] tamsulosin [facility]) capsule 0.4 mg  0.4 mg Oral Daily Karmen Bongo, MD      . ticagrelor  [facility]) tablet 90 mg  90 mg Oral BID Karmen Bongo, MD      . tiotropium [facility]) inhalation capsule ([facility] use ONLY) 18 mcg  18 mcg Inhalation Daily Karmen Bongo, MD      . traZODone (DESYREL) tablet 25 mg  25 mg Oral QHS PRN Karmen Bongo, MD      . zolpidem Lorrin Mais) tablet 5 mg  5 mg Oral QHS PRN Karmen Bongo, MD         ROS: As per HPI otherwise negative.  Physical Exam: Vitals:   06/13/20 1113 06/13/20 1123  BP:  (!) 155/66  Pulse:  94  Resp:  16  Temp:  98.8 F (37.1 C)  SpO2:  94%  Weight: 60.6 kg   Height: '5\' 9"'$  (1.753 m)      General:  WNWD man, nad, on nasal O2  Head: NCAT sclera not icteric MMM Neck: Supple. No JVD appreciated  Lungs: CTA bilaterally without wheezes, rales, or rhonchi. Breathing is unlabored. Heart: RRR with S1 S2 Abdomen: soft non-tender  Lower extremities:without edema or ischemic changes, no open wounds  Neuro: A & O  X 3. Moves all extremities spontaneously. Psych:  Responds to questions appropriately with a normal affect. Dialysis Access: LUE AVF +bruit   Labs: Basic Metabolic Panel: No results for input(s): NA, K, CL, CO2, GLUCOSE, BUN, CREATININE, CALCIUM, PHOS in the last 168 hours.  Invalid input(s): ALB Liver Function Tests: No results for input(s): AST, ALT, ALKPHOS, BILITOT, PROT, ALBUMIN in the last 168 hours. No results for input(s): LIPASE, AMYLASE in the last 168 hours. No results for input(s): AMMONIA in the last 168 hours. CBC: Recent Labs  Lab 06/13/20 1410  WBC 6.7  NEUTROABS 5.3  HGB 9.0*  HCT 30.0*  MCV 97.7  PLT 170   Cardiac Enzymes: No results for input(s): CKTOTAL, CKMB, CKMBINDEX, TROPONINI in the  last 168 hours. CBG: No results for input(s): GLUCAP in the last 168 hours. Iron Studies: No results for input(s): IRON, TIBC, TRANSFERRIN, FERRITIN in the last 72 hours. Studies/Results: No results found.  Dialysis Orders:  [place]  MWF 3h 15 min 450/500 EDW 60.5kg 2K/2.5Ca  AVF  No heparin  Sensipar 60 TIW Hectorol 2 TIW Mircera 200 (last 3/25)   Assessment/Plan: 1. Dyspnea --Likely multifactorial with Hx of  COPD, CHF and possible excess volume. Will try to optimize volume status with HD today.  2. ESRD -  HD MWF. Had truncated HD yesterday. Plan HD off schedule today.  3. Hypertension/volume  - BP ok. At dry weight, but will try to challenge today. UF 2-2.5L  4. Anemia  - Hgb 9.0. Recent ESA dose as outpatient  5. Metabolic bone disease -  Continue binders, Sensipar, Hectorol.  6. CHF --Echo pending  7. COPD/chronic resp failure -- On 3L home O2   Lynnda Child PA-C [facility] 06/13/2020, 3:06 PM

## 2020-06-14 ENCOUNTER — Inpatient Hospital Stay (HOSPITAL_COMMUNITY): Payer: Medicare Other

## 2020-06-14 DIAGNOSIS — I5021 Acute systolic (congestive) heart failure: Secondary | ICD-10-CM

## 2020-06-14 DIAGNOSIS — R0609 Other forms of dyspnea: Secondary | ICD-10-CM | POA: Diagnosis not present

## 2020-06-14 LAB — GLUCOSE, CAPILLARY
Glucose-Capillary: 153 mg/dL — ABNORMAL HIGH (ref 70–99)
Glucose-Capillary: 169 mg/dL — ABNORMAL HIGH (ref 70–99)
Glucose-Capillary: 195 mg/dL — ABNORMAL HIGH (ref 70–99)
Glucose-Capillary: 230 mg/dL — ABNORMAL HIGH (ref 70–99)
Glucose-Capillary: 302 mg/dL — ABNORMAL HIGH (ref 70–99)

## 2020-06-14 LAB — BASIC METABOLIC PANEL
Anion gap: 10 (ref 5–15)
BUN: 18 mg/dL (ref 8–23)
CO2: 29 mmol/L (ref 22–32)
Calcium: 9.3 mg/dL (ref 8.9–10.3)
Chloride: 98 mmol/L (ref 98–111)
Creatinine, Ser: 2.58 mg/dL — ABNORMAL HIGH (ref 0.61–1.24)
GFR, Estimated: 27 mL/min — ABNORMAL LOW (ref >60)
Glucose, Bld: 237 mg/dL — ABNORMAL HIGH (ref 70–99)
Potassium: 3.6 mmol/L (ref 3.5–5.1)
Sodium: 137 mmol/L (ref 135–145)

## 2020-06-14 LAB — ECHOCARDIOGRAM COMPLETE
Area-P 1/2: 5.16 cm2
Calc EF: 35.1 %
Height: 69 in
MV M vel: 5.22 m/s
MV Peak grad: 109 mmHg
S' Lateral: 4.7 cm
Single Plane A2C EF: 32.5 %
Single Plane A4C EF: 40.3 %
Weight: 2119.94 oz

## 2020-06-14 LAB — CBC
HCT: 31.7 % — ABNORMAL LOW (ref 39.0–52.0)
Hemoglobin: 9.9 g/dL — ABNORMAL LOW (ref 13.0–17.0)
MCH: 29.8 pg (ref 26.0–34.0)
MCHC: 31.2 g/dL (ref 30.0–36.0)
MCV: 95.5 fL (ref 80.0–100.0)
Platelets: 197 10*3/uL (ref 150–400)
RBC: 3.32 MIL/uL — ABNORMAL LOW (ref 4.22–5.81)
RDW: 18.6 % — ABNORMAL HIGH (ref 11.5–15.5)
WBC: 8.3 10*3/uL (ref 4.0–10.5)
nRBC: 0 % (ref 0.0–0.2)

## 2020-06-14 MED ORDER — OXYCODONE HCL 5 MG PO TABS
5.0000 mg | ORAL_TABLET | ORAL | Status: DC | PRN
  Administered 2020-06-14 – 2020-06-16 (×6): 5 mg via ORAL
  Filled 2020-06-14 (×6): qty 1

## 2020-06-14 MED ORDER — CHLORHEXIDINE GLUCONATE CLOTH 2 % EX PADS
6.0000 | MEDICATED_PAD | Freq: Every day | CUTANEOUS | Status: DC
  Administered 2020-06-15 – 2020-06-17 (×3): 6 via TOPICAL

## 2020-06-14 NOTE — Progress Notes (Signed)
  Echocardiogram 2D Echocardiogram has been performed.  Brandon Chaney 06/14/2020, 8:57 AM

## 2020-06-14 NOTE — Progress Notes (Signed)
Patient arrived to room from dialysis.  VSS at this time.  Snack provided per request.  Denies further needs at this time.

## 2020-06-14 NOTE — Progress Notes (Signed)
SLP Cancellation Note  Patient Details Name: Brandon Chaney MRN: [id number] DOB: Jul 10, 1955   Cancelled treatment:       Reason Eval/Treat Not Completed: Patient at procedure or test/unavailable. Patient at dialysis. SLP will attempt to see patient next date for BSE. Of note, patient here last month, MBS completed and patient's swallow was found to be [facility].  Sonia Baller, MA, CCC-SLP Speech Therapy

## 2020-06-14 NOTE — Progress Notes (Signed)
Spoke with this pt's attending MD Diamantina [facility]). And explained the pt has very poor vasculature. MD stated he will look at the chart and if possible, change meds to all PO.

## 2020-06-14 NOTE — Progress Notes (Signed)
[facility] Progress Note  Patient: Brandon Chaney    [id number]  DOA: 06/13/2020     Date of Service: the patient was seen and examined on 06/14/2020  Brief hospital course: ESRD on HD MWF, COPD, CAD, recent hospitalization for pneumonia.  Presents with complaints of cough and shortness of breath found to have volume overload secondary to ESRD. Currently plan is aggressive HD.  Assessment and Plan: 1.  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 Currently on 3 LPM. Chest x-ray shows vascular congestion. While BNP and troponin is elevated not consistent in the setting of ESRD. Nephrology performing daily dialysis for now. Discontinue IV Lasix given patient does not make any urine. Monitor on telemetry. Echocardiogram ordered, currently results pending.  2.  Reported history of hemoptysis Currently resolved. H&H stable. Extensive evaluation during last admission. Monitor for now.  3.  CAD and PVD SP PCI in December 2021. 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 On aspirin and Brilinta. Currently continuing same for now.  4.  HLD Continue Lipitor.  5.  Type 2 diabetes mellitus, controlled with renal dysfunction Holding oral hypoglycemic agent Continue sensitive sliding scale.  6.  Protein calorie malnutrition Underweight. Initiate Nepro. Body mass index is 18.88 kg/m.    Interventions:        Diet: Renal diet DVT Prophylaxis:   heparin injection 5,000 Units Start: 06/13/20 1400    Advance goals of care discussion: Full code  Family Communication: no family was present at bedside, at the time of interview.   Disposition:  Status is: Inpatient  Remains inpatient appropriate because:Inpatient level of care appropriate due to severity of illness   Dispo: The patient is from: Home              Anticipated d/c is  to: Home              Patient currently is not medically stable to d/c.   Difficult to place patient   Subjective: No nausea no vomiting reports generalized body ache and also cough and shortness of breath.  No chest pain or chest tightness.  Physical Exam:  General: Appear in mild distress, no Rash; Oral Mucosa Clear, moist. no Abnormal Neck Mass Or lumps, Conjunctiva normal  Cardiovascular: S1 and S2 Present, no Murmur, Respiratory: increased respiratory effort, Bilateral Air entry present and bilateral  Crackles, no wheezes Abdomen: Bowel Sound present, Soft and no tenderness Extremities: no Pedal edema Neurology: alert and oriented to time, place, and person affect appropriate. no new focal deficit Gait not checked due to patient safety concerns  Vitals:   06/14/20 1500 06/14/20 1530 06/14/20 1536 06/14/20 1700  BP: (!) 129/33 (!) 150/30 (!) 150/47 (!) 132/39  Pulse: 86 88 90 89  Resp: '20 20 20 18  '$ Temp:   98.6 F (37 C) (!) 97.4 F (36.3 C)  TempSrc:   Oral Oral  SpO2: 100% 100% 100% 98%  Weight:   58 kg   Height:        Intake/Output Summary (Last 24 hours) at 06/14/2020 1918 Last data filed at 06/14/2020 1536 Gross per 24 hour  Intake 820 ml  Output 6325 ml  Net -5505 ml   Filed Weights   06/14/20 0500 06/14/20 1236 06/14/20 1536  Weight: 60.1 kg 61 kg 58 kg    Data Reviewed: I have personally reviewed and interpreted daily  labs, tele strips, imaging. I reviewed all nursing notes, pharmacy notes, vitals, pertinent old records I have discussed plan of care as described above with RN and patient/family.  CBC: Recent Labs  Lab 06/13/20 1410 06/14/20 0253  WBC 6.7 8.3  NEUTROABS 5.3  --   HGB 9.0* 9.9*  HCT 30.0* 31.7*  MCV 97.7 95.5  PLT 170 [id number]   Basic Metabolic Panel: Recent Labs  Lab 06/13/20 1410 06/14/20 0253  NA 137 137  K 3.4* 3.6  CL 98 98  CO2 27 29  GLUCOSE 175* 237*  BUN 44* 18  CREATININE 4.50* 2.58*  CALCIUM 7.8* 9.3     Studies: ECHOCARDIOGRAM COMPLETE  Result Date: 06/14/2020    ECHOCARDIOGRAM REPORT   Patient Name:   LAMAREON SPORT Date of Exam: 06/14/2020 Medical Rec #:  [id number]      Height:       69.0 in Accession #:    [id number]     Weight:       132.5 lb Date of Birth:  1955-11-13      BSA:          1.734 m Patient Age:    65 years       BP:           156/39 mmHg Patient Gender: M              HR:           95 bpm. Exam Location:  Inpatient Procedure: 2D Echo, Cardiac Doppler and Color Doppler Indications:    CHF-Acute Systolic [id number] / [id number]  History:        Patient has no prior history of Echocardiogram examinations.                 CAD, COPD; Risk Factors:Diabetes and Dyslipidemia.  Sonographer:    Vickie Epley RDCS Referring Phys: [place]  1. Global hypokinesis with akinesis of the inferior wall; overall moderate to severe LV dysfunction.  2. Left ventricular ejection fraction, by estimation, is 30 to 35%. The left ventricle has moderate to severely decreased function. The left ventricle demonstrates regional wall motion abnormalities (see scoring diagram/findings for description). The left ventricular internal cavity size was mildly dilated. Left ventricular diastolic parameters are consistent with Grade II diastolic dysfunction (pseudonormalization). Elevated left atrial pressure.  3. Right ventricular systolic function is normal. The right ventricular size is normal.  4. Left atrial size was moderately dilated.  5. The mitral valve is normal in structure. Mild mitral valve regurgitation. No evidence of mitral stenosis.  6. The aortic valve is tricuspid. Aortic valve regurgitation is not visualized. No aortic stenosis is present.  7. The inferior vena cava is normal in size with greater than 50% respiratory variability, suggesting right atrial pressure of 3 mmHg. Conclusion(s)/Recommendation(s): Findings consistent with Cor Pulmonale. FINDINGS  Left Ventricle: Left ventricular ejection  fraction, by estimation, is 30 to 35%. The left ventricle has moderate to severely decreased function. The left ventricle demonstrates regional wall motion abnormalities. The left ventricular internal cavity size was mildly dilated. There is no left ventricular hypertrophy. Left ventricular diastolic parameters are consistent with Grade II diastolic dysfunction (pseudonormalization). Elevated left atrial pressure. Right Ventricle: The right ventricular size is normal.Right ventricular systolic function is normal. Left Atrium: Left atrial size was moderately dilated. Right Atrium: Right atrial size was normal in size. Pericardium: There is no evidence of pericardial effusion. Mitral Valve: The mitral valve is normal in structure. Mild mitral  valve regurgitation. No evidence of mitral valve stenosis. Tricuspid Valve: The tricuspid valve is normal in structure. Tricuspid valve regurgitation is trivial. No evidence of tricuspid stenosis. Aortic Valve: The aortic valve is tricuspid. Aortic valve regurgitation is not visualized. No aortic stenosis is present. Pulmonic Valve: The pulmonic valve was normal in structure. Pulmonic valve regurgitation is not visualized. No evidence of pulmonic stenosis. Aorta: The aortic root is normal in size and structure. Venous: The inferior vena cava is normal in size with greater than 50% respiratory variability, suggesting right atrial pressure of 3 mmHg. IAS/Shunts: No atrial level shunt detected by color flow Doppler. Additional Comments: Global hypokinesis with akinesis of the inferior wall; overall moderate to severe LV dysfunction.  LEFT VENTRICLE PLAX 2D LVIDd:         5.90 cm      Diastology LVIDs:         4.70 cm      LV e' medial:    4.03 cm/s LV PW:         0.90 cm      LV E/e' medial:  20.2 LV IVS:        0.90 cm      LV e' lateral:   5.52 cm/s LVOT diam:     2.30 cm      LV E/e' lateral: 14.7 LV SV:         88 LV SV Index:   51 LVOT Area:     4.15 cm  LV Volumes (MOD) LV  vol d, MOD A2C: 252.0 ml LV vol d, MOD A4C: 233.0 ml LV vol s, MOD A2C: 170.0 ml LV vol s, MOD A4C: 139.0 ml LV SV MOD A2C:     82.0 ml LV SV MOD A4C:     233.0 ml LV SV MOD BP:      85.8 ml RIGHT VENTRICLE RV S prime:     11.60 cm/s TAPSE (M-mode): 1.8 cm LEFT ATRIUM           Index       RIGHT ATRIUM           Index LA diam:      4.70 cm 2.71 cm/m  RA Area:     12.50 cm LA Vol (A2C): 58.9 ml 33.96 ml/m RA Volume:   33.00 ml  19.03 ml/m LA Vol (A4C): 69.3 ml 39.96 ml/m  AORTIC VALVE LVOT Vmax:   132.00 cm/s LVOT Vmean:  76.700 cm/s LVOT VTI:    0.212 m  AORTA Ao Root diam: 3.20 cm MITRAL VALVE MV Area (PHT): 5.16 cm    SHUNTS MV Decel Time: 147 msec    Systemic VTI:  0.21 m MR Peak grad: 109.0 mmHg   Systemic Diam: 2.30 cm MR Mean grad: 68.0 mmHg MR Vmax:      522.00 cm/s MR Vmean:     390.0 cm/s MV E velocity: 81.30 cm/s MV A velocity: 42.20 cm/s MV E/A ratio:  1.93 Kirk Ruths MD Electronically signed by Kirk Ruths MD Signature Date/Time: 06/14/2020/11:52:13 AM    Final     Scheduled Meds: . aspirin EC  81 mg Oral Daily  . atorvastatin  80 mg Oral Daily  . Chlorhexidine Gluconate Cloth  6 each Topical Q0600  . cinacalcet  30 mg Oral Q breakfast  . docusate sodium  100 mg Oral BID  . [START ON 06/15/2020] famotidine  20 mg Oral Once per day on Mon Wed Fri  . guaiFENesin  1,200 mg  Oral Daily  . heparin  5,000 Units Subcutaneous Q8H  . insulin aspart  0-6 Units Subcutaneous TID WC  . mirtazapine  15 mg Oral QHS  . multivitamin  1 tablet Oral QHS  . predniSONE  10 mg Oral Q breakfast  . [START ON 06/17/2020] predniSONE  5 mg Oral Q breakfast  . sodium chloride flush  3 mL Intravenous Q12H  . tamsulosin  0.4 mg Oral Daily  . ticagrelor  90 mg Oral BID  . tiotropium  18 mcg Inhalation Daily   Continuous Infusions: PRN Meds: acetaminophen **OR** acetaminophen, albuterol, bisacodyl, calcium carbonate (dosed in mg elemental calcium), camphor-menthol **AND** hydrOXYzine, docusate sodium,  feeding supplement (NEPRO CARB STEADY), hydrALAZINE, ondansetron **OR** ondansetron (ZOFRAN) IV, oxyCODONE, polyethylene glycol, traZODone, zolpidem  Time spent: 35 minutes  Author: Berle Mull, MD [facility] 06/14/2020 7:18 PM  To reach On-call, see care teams to locate the attending and reach out via [email or web address]. Between 7PM-7AM, please contact night-coverage If you still have difficulty reaching the attending provider, please page the [facility] (Director on Call) for [facility] on amion for assistance.

## 2020-06-14 NOTE — Progress Notes (Signed)
[facility] Progress Note   Subjective:  Completed dialysis last night -net UF 3L Didn't sleep well, restless and sob   Objective Vitals:   06/14/20 0002 06/14/20 0438 06/14/20 0500 06/14/20 0919  BP: (!) 156/49 (!) 156/39  (!) 131/47  Pulse: 91 94  91  Resp: '16 18  18  '$ Temp: 98.7 F (37.1 C) 98.8 F (37.1 C)  99 F (37.2 C)  TempSrc: Oral Oral  Oral  SpO2: 90% 92%  100%  Weight:   60.1 kg   Height:         Additional Objective Labs: Basic Metabolic Panel: Recent Labs  Lab 06/13/20 1410 06/14/20 0253  NA 137 137  K 3.4* 3.6  CL 98 98  CO2 27 29  GLUCOSE 175* 237*  BUN 44* 18  CREATININE 4.50* 2.58*  CALCIUM 7.8* 9.3   CBC: Recent Labs  Lab 06/13/20 1410 06/14/20 0253  WBC 6.7 8.3  NEUTROABS 5.3  --   HGB 9.0* 9.9*  HCT 30.0* 31.7*  MCV 97.7 95.5  PLT 170 197   Blood Culture    Component Value Date/Time   SDES BRONCHIAL ALVEOLAR LAVAGE 05/22/2020 0933   SPECREQUEST BRONCHIAL LAVAGE RIGHT MIDDLE LOBE SPEC A 05/22/2020 0933   CULT  05/22/2020 0933    FEW Normal respiratory flora-no Staph aureus or Pseudomonas seen Performed at [facility], [place] [address], [place], [place] [address]    REPTSTATUS 05/24/2020 FINAL 05/22/2020 0933     Physical Exam General: Chronically ill appearing  Heart: RRR  Lungs: Diminished  Abdomen: soft non-tender  Extremities: No LE edema  Dialysis Access: LUE AVF +bruit   Medications:  . aspirin EC  81 mg Oral Daily  . atorvastatin  80 mg Oral Daily  . Chlorhexidine Gluconate Cloth  6 each Topical Q0600  . cinacalcet  30 mg Oral Q breakfast  . docusate sodium  100 mg Oral BID  . [START ON 06/15/2020] famotidine  20 mg Oral Once per day on Mon Wed Fri  . furosemide  40 mg Intravenous BID  . guaiFENesin  1,200 mg Oral Daily  . heparin  5,000 Units Subcutaneous Q8H  . insulin aspart  0-6 Units Subcutaneous TID WC  . mirtazapine  15 mg Oral QHS  . multivitamin  1 tablet Oral QHS  . predniSONE   10 mg Oral Q breakfast  . [START ON 06/17/2020] predniSONE  5 mg Oral Q breakfast  . sodium chloride flush  3 mL Intravenous Q12H  . tamsulosin  0.4 mg Oral Daily  . ticagrelor  90 mg Oral BID  . tiotropium  18 mcg Inhalation Daily    Dialysis Orders:  [facility]  MWF 3h 15 min 450/500 EDW 60.5kg 2K/2.5Ca  AVF No heparin  Sensipar 60 TIW Hectorol 2 TIW Mircera 200 (last 3/25)   Assessment/Plan: 1. Dyspnea --Hx COPD, CHF -Pulm edema on CXR 3/26. Will try to optimize volume status with HD. Got 3L off yesterday. Likely losing weight and needs EDW lowered. Remains dyspneic this am -plan for serial HD.  2. ESRD -  HD MWF. Had extra HD yesterday. Orders for HD again tonight once night RN arrives.  3. Hypertension/volume  - BP ok. At dry weight, but will continue to challenge. Will need lower dry weight at discharge.  4. Anemia  - Hgb 9.9. Recent ESA dose as outpatient  5. Metabolic bone disease -  Continue binders, Sensipar, Hectorol.  6. CHF --Echo pending  7.  COPD/chronic resp failure -- On 3L home O2   Lynnda Child PA-C [employer] 06/14/2020,9:53 AM

## 2020-06-14 NOTE — Progress Notes (Signed)
Occupational Therapy Treatment Patient Details Name: Brandon Chaney MRN: [id number] DOB: [birth year] Today's Date: 06/14/2020    History of present illness 15 yom with ESRD, CAD, EF 20-25%, COPD, chronic 2-3 Lpm O2 requirement, treated for pneumonia recently and now p/w progressive SOB and orthopnea since hospital discharge 2 weeks prior to this admission. He is afebrile, saturating okay on his usual O2, but dyspneic with minimal exertion. CXR with diffuse b/l airspace disease and small pleural effusions. Troponin 90s and flat. pro-BNP beyond quantifiable limit. WBC normal. Hgb stable at 9.7. He had HD just prior to arrival in ED, diuresed and had slight improvement after IV Lasix in ED, but still SOB.   OT comments  Pt. Was seen for OT evaluation. Pt. Has increased weakness and has had several falls at home. Pt. Is open to have OT while in the hospital be does not want OT at home. Acute OT to follow to maximize pt. Potential prior to dc home.   Follow Up Recommendations  No OT follow up (Pt. does not want OT at home)    Equipment Recommendations  None recommended by OT    Recommendations for Other Services      Precautions / Restrictions Precautions Precautions: Fall (Pt. reports 3 falls in the past few months.) Restrictions Weight Bearing Restrictions: No RLE Weight Bearing: Weight bearing as tolerated LLE Weight Bearing: Weight bearing as tolerated       Mobility Bed Mobility Overal bed mobility: Modified Independent                  Transfers Overall transfer level: Needs assistance Equipment used: Rolling walker (2 wheeled) Transfers: Sit to/from [employer] Sit to Stand: Supervision   Squat pivot transfers: Supervision     General transfer comment: cues for proper hand placement    Balance                                           ADL either performed or assessed with clinical judgement   ADL Overall ADL's : Needs  assistance/impaired Eating/Feeding: Independent   Grooming: Wash/dry face;Oral care;Supervision/safety;Standing   Upper Body Bathing: Supervision/ safety;Set up;Sitting   Lower Body Bathing: Supervison/ safety;Set up;Sit to/from stand   Upper Body Dressing : Supervision/safety;Set up;Sitting   Lower Body Dressing: Supervision/safety;Set up;Sit to/from stand   Toilet Transfer: Supervision/safety;BSC;RW   Toileting- Water quality scientist and Hygiene: Supervision/safety;Sit to/from stand       Functional mobility during ADLs: Supervision/safety;Rolling walker General ADL Comments: Pt. states he just feels weaker and would like OT to come and see him.     Vision Baseline Vision/History:  (Pt. reports he is light sensative following shingles in eyes. Pt. reports he had b cataract sx and has not been back fotr followup. He states that he needs glasses for distance and his reading glasses are not effective.) Patient Visual Report:  (baseline since cataract sx and will be seeing eye doctor as soon as he can.)     Perception     Praxis      Cognition Arousal/Alertness: Awake/alert Behavior During Therapy: WFL for tasks assessed/performed Overall Cognitive Status: Within Functional Limits for tasks assessed  Exercises     Shoulder Instructions       General Comments      Pertinent Vitals/ Pain       Pain Assessment: 0-10 Pain Score: 9  Pain Location: B shoulder and hands Pain Descriptors / Indicators: Aching Pain Intervention(s): Premedicated before session  Home Living Family/patient expects to be discharged to:: Private residence Living Arrangements: Spouse/significant other Available Help at Discharge: Family;Home health (Pt. has a home health aid come in 3 days a week to help get him ready for hd.) Type of Home: House Home Access: Ramped entrance     Home Layout: One level     Bathroom Shower/Tub:  Teacher, early years/pre: Standard Bathroom Accessibility: No How Accessible:  (Pt. is not able to use walker in bathroom but can use cane) Home Equipment: Walker - 4 wheels;Walker - 2 wheels;Cane - single point;Electric scooter;Grab bars - tub/shower;Shower seat          Prior Functioning/Environment Level of Independence: Needs assistance    ADL's / Homemaking Assistance Needed: Pt. has s for shower transfer. Pt. was able to bath and dress self. Pt. was Mod i wtih transfer to commode. He had an aid come in 3 days a week to help him with ADLs in HD days.       Frequency  Min 2X/week        Progress Toward Goals  OT Goals(current goals can now be found in the care plan section)     Acute Rehab OT Goals Patient Stated Goal: to get better OT Goal Formulation: With patient Time For Goal Achievement: 06/28/20 Potential to Achieve Goals: Good ADL Goals Pt Will Perform Lower Body Bathing: with modified independence;sit to/from stand Pt Will Perform Lower Body Dressing: with modified independence;sit to/from stand Pt Will Transfer to Toilet: with modified independence;ambulating Pt Will Perform Toileting - Clothing Manipulation and hygiene: with modified independence;sit to/from stand  Plan      Co-evaluation                 AM-PAC OT "6 Clicks" Daily Activity     Outcome Measure   Help from another person eating meals?: None Help from another person taking care of personal grooming?: A Little Help from another person toileting, which includes using toliet, bedpan, or urinal?: A Little Help from another person bathing (including washing, rinsing, drying)?: A Little Help from another person to put on and taking off regular upper body clothing?: A Little Help from another person to put on and taking off regular lower body clothing?: A Little 6 Click Score: 19    End of Session Equipment Utilized During Treatment: Rolling walker;Oxygen  OT Visit  Diagnosis: Unsteadiness on feet (R26.81);Repeated falls (R29.6)   Activity Tolerance Patient tolerated treatment well   Patient Left in bed;with call bell/phone within reach;with bed alarm set   Nurse Communication  (ok therapy)        Time: 1000-1048 OT Time Calculation (min): 48 min  Charges: OT General Charges $OT Visit: 1 Visit OT Evaluation $OT Eval Moderate Complexity: 1 Mod OT Treatments $Self Care/Home Management : 8-22 mins  Reece Packer OT/L    Astrid Vides 06/14/2020, 11:27 AM

## 2020-06-14 NOTE — Evaluation (Signed)
Physical Therapy Evaluation Patient Details Name: Brandon Chaney MRN: [id number] DOB: Jan 22, 1956 Today's Date: 06/14/2020   History of Present Illness  44 yom with ESRD, CAD, EF 20-25%, COPD, chronic 2-3 Lpm O2 requirement, treated for pneumonia recently and now p/w progressive SOB and orthopnea since hospital discharge 2 weeks prior to this admission. He is afebrile, saturating okay on his usual O2, but dyspneic with minimal exertion. CXR with diffuse b/l airspace disease and small pleural effusions. Troponin 90s and flat. pro-BNP beyond quantifiable limit. WBC normal. Hgb stable at 9.7. He had HD just prior to arrival in ED, diuresed and had slight improvement after IV Lasix in ED, but still SOB.  Clinical Impression   Pt admitted with above diagnosis. From home where he lives with his wife in a single level home with a ramped entrance; Walks household distances at baseline, and uses power scooter for community distances; Lately has been using scooter indoors due to fatigue and dyspnea; Presents to PT with generalized weakness, inability to wean supplemental O2; BPs standign and sitting, taken at EOB also considerably low;  Pt currently with functional limitations due to the deficits listed below (see PT Problem List). Pt will benefit from skilled PT to increase their independence and safety with mobility to allow discharge to the venue listed below.      06/14/20 1155 06/14/20 1200  Orthostatic Sitting  BP- Sitting  --  (!) 86/63 (MAP 73)  Pulse- Sitting  --  99  Orthostatic Standing at 0 minutes  BP- Standing at 0 minutes (!) 83/55 (MAP 62)  --   Pulse- Standing at 0 minutes 94  --       Follow Up Recommendations Home health PT;Supervision/Assistance - 24 hour    Equipment Recommendations  None recommended by PT (pretty well-equipped)    Recommendations for Other Services       Precautions / Restrictions Precautions Precautions: Fall (Pt. reports 3 falls in the past few  months.) Restrictions Weight Bearing Restrictions: No      Mobility  Bed Mobility Overal bed mobility: Modified Independent                  Transfers Overall transfer level: Needs assistance Equipment used: Rolling walker (2 wheeled) Transfers: Sit to/from Stand Sit to Stand: Min guard         General transfer comment: cues for proper hand placement, and to self-monitor for activity tolerance  Ambulation/Gait Ambulation/Gait assistance: Min guard (without physical contact) Gait Distance (Feet):  (Marched in place at [employer]) Assistive device: Rolling walker (2 wheeled) Gait Pattern/deviations: Shuffle     General Gait Details: Cues to self-monitor fro activity tolerance  Stairs            Wheelchair Mobility    Modified Rankin (Stroke Patients Only)       Balance                                             Pertinent Vitals/Pain Pain Assessment: Faces Faces Pain Scale: Hurts a little bit Pain Location: Bil shoulder and hands; "I hve been hurting fo so long . . . " Pain Descriptors / Indicators: Aching Pain Intervention(s): Monitored during session    Home Living Family/patient expects to be discharged to:: Private residence Living Arrangements: Spouse/significant other Available Help at Discharge: Family;Home health (Pt. has a home health aid come in  3 days a week to help get him ready for hd.) Type of Home: House Home Access: Ramped entrance     Home Layout: One level Home Equipment: [place] - 4 wheels;Walker - 2 wheels;Cane - single point;Electric scooter;Grab bars - tub/shower;Shower seat      Prior Function Level of Independence: Needs assistance      ADL's / Homemaking Assistance Needed: Pt. has s for shower transfer. Pt. was able to bath and dress self. Pt. was Mod i wtih transfer to commode. He had an aid come in 3 days a week to help him with ADLs in HD days.        Hand Dominance        Extremity/Trunk  Assessment   Upper Extremity Assessment Upper Extremity Assessment: Defer to OT evaluation    Lower Extremity Assessment Lower Extremity Assessment: Generalized weakness (Noted partial foot amputation R foot)       Communication   Communication: No difficulties  Cognition Arousal/Alertness: Awake/alert Behavior During Therapy: WFL for tasks assessed/performed Overall Cognitive Status: Within Functional Limits for tasks assessed                                        General Comments General comments (skin integrity, edema, etc.): Session conducted on 3 L supplemental O2, and O2 sats remained greater than or equal to 95%    Exercises     Assessment/Plan    PT Assessment Patient needs continued PT services  PT Problem List Decreased strength;Decreased activity tolerance;Decreased balance;Decreased mobility;Decreased coordination;Decreased knowledge of use of DME;Cardiopulmonary status limiting activity       PT Treatment Interventions DME instruction;Gait training;Functional mobility training;Therapeutic activities;Therapeutic exercise;Balance training;Neuromuscular re-education;Patient/family education    PT Goals (Current goals can be found in the Care Plan section)  Acute Rehab PT Goals Patient Stated Goal: to get better PT Goal Formulation: With patient Time For Goal Achievement: 06/28/20 Potential to Achieve Goals: Good    Frequency Min 3X/week   Barriers to discharge        Co-evaluation               AM-PAC PT "6 Clicks" Mobility  Outcome Measure Help needed turning from your back to your side while in a flat bed without using bedrails?: None Help needed moving from lying on your back to sitting on the side of a flat bed without using bedrails?: None Help needed moving to and from a bed to a chair (including a wheelchair)?: None Help needed standing up from a chair using your arms (e.g., wheelchair or bedside chair)?: A Little Help  needed to walk in hospital room?: A Little Help needed climbing 3-5 steps with a railing? : A Little 6 Click Score: 21    End of Session   Activity Tolerance: Patient tolerated treatment well;Other (comment) (even with considerably low blood pressure) Patient left: in bed;with call bell/phone within reach (sitting and eating lunch EOB before going to HD) Nurse Communication: Mobility status PT Visit Diagnosis: Unsteadiness on feet (R26.81);Other abnormalities of gait and mobility (R26.89);Repeated falls (R29.6);Muscle weakness (generalized) (M62.81)    Time: [id number] PT Time Calculation (min) (ACUTE ONLY): 15 min   Charges:   PT Evaluation $PT Eval Moderate Complexity: 1 Mod          Roney [place], [place]  Acute Rehabilitation Services Pager [phone] Office [phone]   Colletta [place] 06/14/2020, 2:15 PM

## 2020-06-15 DIAGNOSIS — R0609 Other forms of dyspnea: Secondary | ICD-10-CM | POA: Diagnosis not present

## 2020-06-15 LAB — CBC
HCT: 38.5 % — ABNORMAL LOW (ref 39.0–52.0)
Hemoglobin: 11.2 g/dL — ABNORMAL LOW (ref 13.0–17.0)
MCH: 28.6 pg (ref 26.0–34.0)
MCHC: 29.1 g/dL — ABNORMAL LOW (ref 30.0–36.0)
MCV: 98.2 fL (ref 80.0–100.0)
Platelets: 219 10*3/uL (ref 150–400)
RBC: 3.92 MIL/uL — ABNORMAL LOW (ref 4.22–5.81)
RDW: 18.6 % — ABNORMAL HIGH (ref 11.5–15.5)
WBC: 7.8 10*3/uL (ref 4.0–10.5)
nRBC: 0.3 % — ABNORMAL HIGH (ref 0.0–0.2)

## 2020-06-15 LAB — GLUCOSE, CAPILLARY
Glucose-Capillary: 146 mg/dL — ABNORMAL HIGH (ref 70–99)
Glucose-Capillary: 281 mg/dL — ABNORMAL HIGH (ref 70–99)
Glucose-Capillary: 355 mg/dL — ABNORMAL HIGH (ref 70–99)
Glucose-Capillary: 241 mg/dL — ABNORMAL HIGH (ref 70–99)

## 2020-06-15 LAB — RENAL FUNCTION PANEL
Albumin: 3.2 g/dL — ABNORMAL LOW (ref 3.5–5.0)
Anion gap: 11 (ref 5–15)
BUN: 27 mg/dL — ABNORMAL HIGH (ref 8–23)
CO2: 29 mmol/L (ref 22–32)
Calcium: 9 mg/dL (ref 8.9–10.3)
Chloride: 98 mmol/L (ref 98–111)
Creatinine, Ser: 3.37 mg/dL — ABNORMAL HIGH (ref 0.61–1.24)
GFR, Estimated: 19 mL/min — ABNORMAL LOW (ref >60)
Glucose, Bld: 172 mg/dL — ABNORMAL HIGH (ref 70–99)
Phosphorus: 2.1 mg/dL — ABNORMAL LOW (ref 2.5–4.6)
Potassium: 3.6 mmol/L (ref 3.5–5.1)
Sodium: 138 mmol/L (ref 135–145)

## 2020-06-15 NOTE — Progress Notes (Signed)
Durward Mallard, RN called and notified the pt's HD tx has been moved to 06/16/2020.

## 2020-06-15 NOTE — Progress Notes (Signed)
Initial Nutrition Assessment  DOCUMENTATION CODES:  Severe malnutrition in context of chronic illness  INTERVENTION:  Recommend removing carb mod restriction from diet order - spoke with MD.  Add Magic cup TID with meals, each supplement provides 290 kcal and 9 grams of protein.  Continue Nepro Shake po TID, each supplement provides 425 kcal and 19 grams protein.  Continue Rena-Vite daily.  NUTRITION DIAGNOSIS:  Severe Malnutrition related to chronic illness (ESRD on HD, chronic CHF, COPD) as evidenced by severe fat depletion,moderate muscle depletion,moderate fat depletion,severe muscle depletion.  GOAL:  Patient will meet greater than or equal to 90% of their needs  MONITOR:  PO intake,Supplement acceptance,Labs,Weight trends,Skin,Diet advancement  REASON FOR ASSESSMENT:  Consult  (Nutritional goals)  ASSESSMENT:  65 yo male with a PMH of ESRD on HD via AV graft, CKD MBD, COPD, T2DM, CAD s/p arteriovenous graft s/p coronary angioplasty, herpes zoster ophthalmicus (R eye). [facility] hospital admit (3 weeks ago) for sepsis d/t PNA. He presents with acute on chronic CHF and chronic respiratory failure 2/2 COPD.  Spoke with pt at bedside. Pt in good spirits. He reports that he is starting to eat better and is getting his appetite back at home since his most recent discharge 3 weeks ago. He reports that it is difficult to eat most days because of the fluid build-up and he does not eat much at home when that occurs. His wife is helping support him with meals as much as possible.  He reports a few pound weight loss in the last couple of weeks also. This could be fluid accumulation. Pt still is severely malnourished with moderate-severe depletions in both fat and muscle.  Pt interested in drinking Nepro, having Magic Cup, and yogurt while in the hospital. Recommend MC TID to promote intake and calories/protein. Also recommend removing carb mod restriction given that pt's A1c level is  normal.  Relevant Medications: colace BID, Pepcid, heparin 5000 units TID, SSI, Remeron, Rena-Vite, prednisone, Brilinta, oxycodone Labs: reviewed; CBG 175-304 HbA1c: 5.6% (05/2020)  NUTRITION - FOCUSED PHYSICAL EXAM: Flowsheet Row Most Recent Value  Orbital Region Moderate depletion  Upper Arm Region Severe depletion  Thoracic and Lumbar Region Severe depletion  Buccal Region Moderate depletion  Temple Region Moderate depletion  Clavicle Bone Region Severe depletion  Clavicle and Acromion Bone Region Severe depletion  Scapular Bone Region Moderate depletion  Dorsal Hand Moderate depletion  Patellar Region Severe depletion  Anterior Thigh Region Severe depletion  Posterior Calf Region Severe depletion  Edema (RD Assessment) None  Hair Reviewed  Eyes Reviewed  Mouth Reviewed  Skin Reviewed  Nails Reviewed     Diet Order:   Diet Order            Diet renal/carb modified with fluid restriction Diet-HS Snack? Nothing; Fluid restriction: 1200 mL Fluid; Room service appropriate? Yes; Fluid consistency: Thin  Diet effective now                EDUCATION NEEDS:  Education needs have been addressed  Skin:  Skin Assessment: Skin Integrity Issues: Skin Integrity Issues:: Other (Comment) (Right and left toe amputation)  Last BM:  06/12/20  Height:  Ht Readings from Last 1 Encounters:  06/13/20 '5\' 9"'$  (1.753 m)   Weight:  Wt Readings from Last 1 Encounters:  06/15/20 56.9 kg   Ideal Body Weight:  72.7 kg  BMI:  Body mass index is 18.52 kg/m.  Estimated Nutritional Needs:  Kcal:  2000-2200 Protein:  100-115 grams Fluid:  1200  ml fluid restriction  Derrel Nip, RD, LDN Registered Dietitian After Hours/Weekend Pager # in [place]

## 2020-06-15 NOTE — Progress Notes (Signed)
Physical Therapy Treatment Patient Details Name: Brandon Chaney MRN: [id number] DOB: 1955/12/01 Today's Date: 06/15/2020    History of Present Illness 74 yom with ESRD, CAD, EF 20-25%, COPD, chronic 2-3 Lpm O2 requirement, treated for pneumonia recently and now p/w progressive SOB and orthopnea since hospital discharge 2 weeks prior to this admission. He is afebrile, saturating okay on his usual O2, but dyspneic with minimal exertion. CXR with diffuse b/l airspace disease and small pleural effusions. Troponin 90s and flat. pro-BNP beyond quantifiable limit. WBC normal. Hgb stable at 9.7. He had HD just prior to arrival in ED, diuresed and had slight improvement after IV Lasix in ED, but still SOB.    PT Comments    Continuing work on functional mobility and activity tolerance;  Session focused on progressive amb with close monitor of activity tolerance, and BPs; Much better BP response to upright activity as outlined here:    06/15/20 1530 06/15/20 1551  Orthostatic Sitting  BP- Sitting 116/40 (Map 63)  --   Pulse- Sitting 93  --   Orthostatic Standing at 0 minutes  BP- Standing at 0 minutes 129/46 (map 72)  --   Pulse- Standing at 0 minutes 94  --   Orthostatic Standing at 3 minutes  BP- Standing at 3 minutes 112/53 (Map 70) 120/42 (map 63)  Pulse- Standing at 3 minutes 99 103     Follow Up Recommendations  Home health PT;Supervision/Assistance - 24 hour     Equipment Recommendations  None recommended by PT (pretty well-equipped)    Recommendations for Other Services       Precautions / Restrictions Precautions Precautions: Fall (Pt. reports 3 falls in the past few months.)    Mobility  Bed Mobility Overal bed mobility: Modified Independent                  Transfers Overall transfer level: Needs assistance Equipment used: Rolling walker (2 wheeled) Transfers: Sit to/from Stand Sit to Stand: Min guard         General transfer comment: cues for proper  hand placement, and to self-monitor for activity tolerance  Ambulation/Gait Ambulation/Gait assistance: Min guard (without physical contact) Gait Distance (Feet): 85 Feet Assistive device: Rolling walker (2 wheeled) Gait Pattern/deviations: Step-through pattern     General Gait Details: Cues to self-monitor fro activity tolerance   Stairs             Wheelchair Mobility    Modified Rankin (Stroke Patients Only)       Balance                                            Cognition Arousal/Alertness: Awake/alert Behavior During Therapy: WFL for tasks assessed/performed Overall Cognitive Status: Within Functional Limits for tasks assessed                                        Exercises      General Comments General comments (skin integrity, edema, etc.): Session conducted on Room Air and O2 sats remained greater than or equal to 92%      Pertinent Vitals/Pain Pain Assessment: Faces Faces Pain Scale: Hurts a little bit Pain Location: Bil shoulder and hands; "I hve been hurting fo so long . . . " Pain Descriptors / Indicators:  Aching Pain Intervention(s): Monitored during session    Home Living                      Prior Function            PT Goals (current goals can now be found in the care plan section) Acute Rehab PT Goals Patient Stated Goal: to get better PT Goal Formulation: With patient Time For Goal Achievement: 06/28/20 Potential to Achieve Goals: Good Progress towards PT goals: Progressing toward goals    Frequency    Min 3X/week      PT Plan Current plan remains appropriate    Co-evaluation              AM-PAC PT "6 Clicks" Mobility   Outcome Measure  Help needed turning from your back to your side while in a flat bed without using bedrails?: None Help needed moving from lying on your back to sitting on the side of a flat bed without using bedrails?: None Help needed moving to and  from a bed to a chair (including a wheelchair)?: None Help needed standing up from a chair using your arms (e.g., wheelchair or bedside chair)?: A Little Help needed to walk in hospital room?: A Little Help needed climbing 3-5 steps with a railing? : A Little 6 Click Score: 21    End of Session Equipment Utilized During Treatment: Gait belt Activity Tolerance: Patient tolerated treatment well;Other (comment) Patient left: in chair;with call bell/phone within reach Nurse Communication: Mobility status PT Visit Diagnosis: Unsteadiness on feet (R26.81);Other abnormalities of gait and mobility (R26.89);Repeated falls (R29.6);Muscle weakness (generalized) (M62.81)     Time: [id number] PT Time Calculation (min) (ACUTE ONLY): 32 min  Charges:  $Gait Training: 23-37 mins                     Roney [place], [place]  Acute Rehabilitation Services Pager [phone] Office [phone]    Colletta [place] 06/15/2020, 5:07 PM

## 2020-06-15 NOTE — Plan of Care (Signed)
  Problem: Education: Goal: Knowledge of General Education information will improve Description: Including pain rating scale, medication(s)/side effects and non-pharmacologic comfort measures Outcome: Progressing   Problem: Health Behavior/Discharge Planning: Goal: Ability to manage health-related needs will improve Outcome: Progressing   Problem: Clinical Measurements: Goal: Ability to maintain clinical measurements within normal limits will improve Outcome: Progressing Goal: Will remain free from infection Outcome: Progressing Goal: Diagnostic test results will improve Outcome: Progressing Goal: Respiratory complications will improve Outcome: Progressing Goal: Cardiovascular complication will be avoided Outcome: Progressing   Problem: Activity: Goal: Risk for activity intolerance will decrease Outcome: Progressing   Problem: Elimination: Goal: Will not experience complications related to bowel motility Outcome: Progressing Goal: Will not experience complications related to urinary retention Outcome: Progressing   Problem: Pain Managment: Goal: General experience of comfort will improve Outcome: Progressing   Problem: Safety: Goal: Ability to remain free from injury will improve Outcome: Progressing   Problem: Skin Integrity: Goal: Risk for impaired skin integrity will decrease Outcome: Progressing   Problem: Education: Goal: Knowledge of disease and its progression will improve Outcome: Progressing Goal: Individualized Educational Video(s) Outcome: Progressing   Problem: Fluid Volume: Goal: Compliance with measures to maintain balanced fluid volume will improve Outcome: Progressing   Problem: Health Behavior/Discharge Planning: Goal: Ability to manage health-related needs will improve Outcome: Progressing   Problem: Clinical Measurements: Goal: Complications related to the disease process, condition or treatment will be avoided or minimized Outcome:  Progressing

## 2020-06-15 NOTE — Progress Notes (Signed)
[facility] Progress Note  Patient: Brandon Chaney    [id number]  DOA: 06/13/2020     Date of Service: the patient was seen and examined on 06/15/2020  Brief hospital course: ESRD on HD MWF, COPD, CAD, recent hospitalization for pneumonia.  Presents with complaints of cough and shortness of breath found to have volume overload secondary to ESRD. Currently plan is continue HD per nephrology for volume removal and arrange for safe discharge once medically cleared by nephrology.  Assessment and Plan: 1.  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 Currently on 3 LPM. Chest x-ray shows vascular congestion. While BNP and troponin is elevated not consistent in the setting of ESRD. Nephrology performing daily dialysis for now. Discontinue IV Lasix given patient does not make any urine. Monitor on telemetry. Echocardiogram ordered, shows 30 to 35% EF, no significant valvular abnormality.  Outpatient follow-up with primary cardiology.  2.  Reported history of hemoptysis Currently resolved. H&H stable. Extensive evaluation during last admission. Monitor for now.  3.  CAD and PVD SP PCI in December 2021. 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 On aspirin and Brilinta. Currently continuing same for now.  4.  HLD Continue Lipitor.  5.  Type 2 diabetes mellitus, controlled with renal dysfunction Holding oral hypoglycemic agent Continue sensitive sliding scale.  6.  Severe protein calorie malnutrition Underweight. Initiate Nepro.  Will change diet to renal only. Body mass index is 18.52 kg/m.  Nutrition Problem: Severe Malnutrition Etiology: chronic illness (ESRD on HD, chronic CHF, COPD) Interventions: Interventions: MVI,Magic cup,Nepro shake      Diet: Renal diet DVT Prophylaxis:   heparin injection 5,000 Units Start: 06/13/20  1400    Advance goals of care discussion: Full code  Family Communication: no family was present at bedside, at the time of interview.   Disposition:  Status is: Inpatient  Remains inpatient appropriate because:Inpatient level of care appropriate due to severity of illness   Dispo: The patient is from: Home              Anticipated d/c is to: Home              Patient currently is not medically stable to d/c.   Difficult to place patient   Subjective: No nausea no vomiting.  Breathing okay.  Still has some cough.  Physical Exam:  General: Appear in mild distress, no Rash; Oral Mucosa Clear, moist. no Abnormal Neck Mass Or lumps, Conjunctiva normal  Cardiovascular: S1 and S2 Present, no Murmur, Respiratory: good respiratory effort, Bilateral Air entry present and CTA, no Crackles, no wheezes Abdomen: Bowel Sound present, Soft and no tenderness Extremities: no Pedal edema Neurology: alert and oriented to time, place, and person affect appropriate. no new focal deficit Gait not checked due to patient safety concerns  Vitals:   06/15/20 0539 06/15/20 0833 06/15/20 1034 06/15/20 1657  BP: 139/60  105/83 (!) 128/40  Pulse: 88  88 87  Resp:   16 16  Temp: 98.2 F (36.8 C)  98.3 F (36.8 C) 98.5 F (36.9 C)  TempSrc: Oral  Oral   SpO2: 93% 98% 97% 93%  Weight:      Height:        Intake/Output Summary (Last 24 hours) at 06/15/2020 1922 Last data filed at 06/15/2020 1900 Gross per 24 hour  Intake 951 ml  Output --  Net 951 ml   Filed Weights   06/14/20 1236 06/14/20 1536 06/15/20 0500  Weight: 61 kg 58 kg 56.9 kg    Data Reviewed: I have personally reviewed and interpreted daily labs, tele strips, imaging. I reviewed all nursing notes, pharmacy notes, vitals, pertinent old records I have discussed plan of care as described above with RN and patient/family.  CBC: Recent Labs  Lab 06/13/20 1410 06/14/20 0253 06/15/20 0814  WBC 6.7 8.3 7.8  NEUTROABS 5.3  --    --   HGB 9.0* 9.9* 11.2*  HCT 30.0* 31.7* 38.5*  MCV 97.7 95.5 98.2  PLT 170 197 [id number]   Basic Metabolic Panel: Recent Labs  Lab 06/13/20 1410 06/14/20 0253 06/15/20 0814  NA 137 137 138  K 3.4* 3.6 3.6  CL 98 98 98  CO2 '27 29 29  '$ GLUCOSE 175* 237* 172*  BUN 44* 18 27*  CREATININE 4.50* 2.58* 3.37*  CALCIUM 7.8* 9.3 9.0  PHOS  --   --  2.1*    Studies: No results found.  Scheduled Meds: . aspirin EC  81 mg Oral Daily  . atorvastatin  80 mg Oral Daily  . Chlorhexidine Gluconate Cloth  6 each Topical Q0600  . cinacalcet  30 mg Oral Q breakfast  . docusate sodium  100 mg Oral BID  . famotidine  20 mg Oral Once per day on Mon Wed Fri  . guaiFENesin  1,200 mg Oral Daily  . heparin  5,000 Units Subcutaneous Q8H  . insulin aspart  0-6 Units Subcutaneous TID WC  . mirtazapine  15 mg Oral QHS  . multivitamin  1 tablet Oral QHS  . predniSONE  10 mg Oral Q breakfast  . [START ON 06/17/2020] predniSONE  5 mg Oral Q breakfast  . sodium chloride flush  3 mL Intravenous Q12H  . tamsulosin  0.4 mg Oral Daily  . ticagrelor  90 mg Oral BID  . tiotropium  18 mcg Inhalation Daily   Continuous Infusions: PRN Meds: acetaminophen **OR** acetaminophen, albuterol, bisacodyl, calcium carbonate (dosed in mg elemental calcium), camphor-menthol **AND** hydrOXYzine, docusate sodium, feeding supplement (NEPRO CARB STEADY), hydrALAZINE, ondansetron **OR** ondansetron (ZOFRAN) IV, oxyCODONE, polyethylene glycol, traZODone, zolpidem  Time spent: 35 minutes  Author: Berle Mull, MD [facility] 06/15/2020 7:22 PM  To reach On-call, see care teams to locate the attending and reach out via [email or web address]. Between 7PM-7AM, please contact night-coverage If you still have difficulty reaching the attending provider, please page the [facility] (Director on Call) for [facility] on amion for assistance.

## 2020-06-15 NOTE — Evaluation (Signed)
Clinical/Bedside Swallow Evaluation Patient Details  Name: Brandon Chaney MRN: [id number] Date of Birth: November 17, 1955  Today's Date: 06/15/2020 Time: SLP Start Time (ACUTE ONLY): 33 SLP Stop Time (ACUTE ONLY): 1028 SLP Time Calculation (min) (ACUTE ONLY): 8 min  Past Medical History:  Past Medical History:  Diagnosis Date  . Anemia due to chronic kidney disease 06/13/2020  . CAD (coronary artery disease) 05/19/2020  . COPD (chronic obstructive pulmonary disease) ([place]) 05/19/2020  . Dyslipidemia 06/13/2020  . ESRD (end stage renal disease) ([place]) 05/19/2020  . Herpes zoster ophthalmicus, right eye   . Ophthalmic herpes zoster infection 06/13/2020  . Type II diabetes mellitus, well controlled ([place]) 06/13/2020   Past Surgical History:  Past Surgical History:  Procedure Laterality Date  . ARTERIOVENOUS GRAFT PLACEMENT Right   . BRONCHIAL WASHINGS Right 05/22/2020   Procedure: BRONCHIAL WASHINGS;  Surgeon: Spero Geralds, MD;  Location: [facility] ENDOSCOPY;  Service: Pulmonary;  Laterality: Right;  . CORONARY ANGIOPLASTY    . IR THORACENTESIS ASP PLEURAL SPACE W/IMG GUIDE  05/20/2020  . VIDEO BRONCHOSCOPY N/A 05/22/2020   Procedure: VIDEO BRONCHOSCOPY WITHOUT FLUORO;  Surgeon: Spero Geralds, MD;  Location: [facility] ENDOSCOPY;  Service: Pulmonary;  Laterality: N/A;   HPI:  ESRD on HD MWF, COPD, CAD, recent hospitalization for pneumonia.  Presents with complaints of cough and shortness of breath found to have volume overload secondary to ESRD. Pa lso admitted for pna on 3/5, and SLP ordered due to condern for aspiration on CXR/CT. MBS showed normal swallow function on 3/7.   Assessment / Plan / Recommendation Clinical Impression  Pt continues to deny trouble swallowing and no signs of aspiration observed during session. Pt reports a good appetite and uses strategies to combat xerostomia in setting of fluid restriction. GIven normal MBS last session, no diet modifications or f/u needed will sign off. SLP Visit  Diagnosis: Dysphagia, unspecified (R13.10)    Aspiration Risk  Mild aspiration risk    Diet Recommendation Regular;Thin liquid        Other  Recommendations     Follow up Recommendations None      Frequency and Duration            Prognosis        Swallow Study   General HPI: ESRD on HD MWF, COPD, CAD, recent hospitalization for pneumonia.  Presents with complaints of cough and shortness of breath found to have volume overload secondary to ESRD. Pa lso admitted for pna on 3/5, and SLP ordered due to condern for aspiration on CXR/CT. MBS showed normal swallow function on 3/7. Type of Study: Bedside Swallow Evaluation Previous Swallow Assessment: see HPI Diet Prior to this Study: Regular;Thin liquids Temperature Spikes Noted: No Respiratory Status: Room air History of Recent Intubation: No Behavior/Cognition: Alert;Cooperative;Pleasant mood Oral Cavity Assessment: Within Functional Limits Oral Care Completed by SLP: No Oral Cavity - Dentition: Adequate natural dentition Vision: Functional for self-feeding Self-Feeding Abilities: Able to feed self Patient Positioning: Partially reclined Baseline Vocal Quality: Normal Volitional Cough: Strong Volitional Swallow: Able to elicit    Oral/Motor/Sensory Function Overall Oral Motor/Sensory Function: Within functional limits   Ice Chips Ice chips: Not tested   Thin Liquid Thin Liquid: Within functional limits    Nectar Thick Nectar Thick Liquid: Not tested   Honey Thick Honey Thick Liquid: Not tested   Puree Puree: Within functional limits   Solid     Solid: Within functional limits      DeBlois, Katherene Ponto 06/15/2020,11:09 AM

## 2020-06-15 NOTE — Progress Notes (Signed)
Heart Failure Navigator Progress Note  Assessed for Heart & Vascular TOC clinic readiness.  Unfortunately at this time the patient does not meet criteria due to HD for ESRD.   Navigator available for reassessment of patient.   Pricilla Holm, RN, BSN Heart Failure Nurse Navigator [phone]

## 2020-06-15 NOTE — Progress Notes (Signed)
Subjective: Said tolerated dialysis yesterday 3.3 L UF now under old OP EDW , no complaints currently stating slept great last night, today is normal HD today and agrees for HD   Objective Vital signs in last 24 hours: Vitals:   06/15/20 0500 06/15/20 0539 06/15/20 0833 06/15/20 1034  BP:  139/60  105/83  Pulse:  88  88  Resp:    16  Temp:  98.2 F (36.8 C)  98.3 F (36.8 C)  TempSrc:  Oral  Oral  SpO2:  93% 98% 97%  Weight: 56.9 kg     Height:       Weight change: 0.445 kg  Physical Exam: General: Thin adult male chronically ill-appearing NAD Heart: RRR no MRG Lungs: Decreased breath sounds at bases otherwise CTA nonlabored Abdomen: BC normal active, ND NT Extremities: No pedal edema Dialysis Access: LUA AVF positive bruit   Dialysis Orders: [place]  MWF 3h 15 min 450/500 EDW 60.5kg 2K/2.5Ca  AVF No heparin  Sensipar 60 TIW Hectorol 2 TIW Mircera 200 (last 3/25)   Problem/Plan: 1. Dyspnea--Hx COPD, CHF -Pulm edema on CXR 3/26.   HD x2 since admission with 3.4 L and 3.3 L UF and now  below EDW, dyspnea resolved HD today normal schedule  2. ESRD -HD MWF. Had HD 3/26 and 3/27 secondary to volume overload, short 3-hour dialysis today keep on schedule 3. Hypertension/volume - BP ok.  Below EDW by 3 kg likely 57, 56 kg follow-up weight today.  4. Anemia - Hgb 11.2 recent ESA dose as outpatient, TSAT 21% with ferritin over 7500, not on iron 5. Metabolic bone disease -calcium 9.2 corrected continue  Sensipar, Hectorol.  Phosphorus 2.1 not on binders 6. CHF --Echo 3/27= EF 30 to 35% with moderate severe left dysfunction global hypokinesis akinesis inferior wall, mild MR, findings consistent with cor pulmonale 7. COPD/chronic resp failure -- On 3L home O2, O2 sat now 100% 2.5 L [place]  Ernest Haber, PA-C [facility] Beeper [phone] 06/15/2020,10:45 AM  LOS: 2 days   Labs: Basic Metabolic Panel: Recent Labs  Lab 06/13/20 1410  06/14/20 0253 06/15/20 0814  NA 137 137 138  K 3.4* 3.6 3.6  CL 98 98 98  CO2 '27 29 29  '$ GLUCOSE 175* 237* 172*  BUN 44* 18 27*  CREATININE 4.50* 2.58* 3.37*  CALCIUM 7.8* 9.3 9.0  PHOS  --   --  2.1*   Liver Function Tests: Recent Labs  Lab 06/13/20 1410 06/15/20 0814  AST 34  --   ALT 37  --   ALKPHOS 123  --   BILITOT 1.3*  --   PROT 6.6  --   ALBUMIN 2.9* 3.2*   No results for input(s): LIPASE, AMYLASE in the last 168 hours. No results for input(s): AMMONIA in the last 168 hours. CBC: Recent Labs  Lab 06/13/20 1410 06/14/20 0253 06/15/20 0814  WBC 6.7 8.3 7.8  NEUTROABS 5.3  --   --   HGB 9.0* 9.9* 11.2*  HCT 30.0* 31.7* 38.5*  MCV 97.7 95.5 98.2  PLT 170 197 219   Cardiac Enzymes: No results for input(s): CKTOTAL, CKMB, CKMBINDEX, TROPONINI in the last 168 hours. CBG: Recent Labs  Lab 06/14/20 0648 06/14/20 1134 06/14/20 1659 06/14/20 2125 06/15/20 0731  GLUCAP 169* 230* 195* 302* 146*    Studies/Results: DG CHEST PORT 1 VIEW  Result Date: 06/13/2020 CLINICAL DATA:  Shortness of breath, cough, end-stage renal disease on dialysis, COPD, coronary artery disease, type  II diabetes mellitus, CHF EXAM: PORTABLE CHEST 1 VIEW COMPARISON:  Portable exam 1641 hours compared to 05/27/2020 FINDINGS: Enlargement of cardiac silhouette with vascular congestion. Atherosclerotic calcification aorta. Extensive BILATERAL pulmonary infiltrates consistent with increased pulmonary edema. RIGHT pleural effusion and basilar atelectasis, with probable tiny LEFT pleural effusion as well. No pneumothorax. Vascular stents at the axilla bilaterally. Scattered endplate spur formation thoracic spine. IMPRESSION: Increased pulmonary edema and RIGHT pleural effusion/basilar atelectasis since prior exam. Electronically Signed   By: Lavonia Dana M.D.   On: 06/13/2020 16:54   ECHOCARDIOGRAM COMPLETE  Result Date: 06/14/2020    ECHOCARDIOGRAM REPORT   Patient Name:   TARAS UTT Date of  Exam: 06/14/2020 Medical Rec #:  [id number]      Height:       69.0 in Accession #:    [id number]     Weight:       132.5 lb Date of Birth:  1956-02-12      BSA:          1.734 m Patient Age:    46 years       BP:           156/39 mmHg Patient Gender: M              HR:           95 bpm. Exam Location:  Inpatient Procedure: 2D Echo, Cardiac Doppler and Color Doppler Indications:    CHF-Acute Systolic [id number] / [id number]  History:        Patient has no prior history of Echocardiogram examinations.                 CAD, COPD; Risk Factors:Diabetes and Dyslipidemia.  Sonographer:    Vickie Epley RDCS Referring Phys: [place]  1. Global hypokinesis with akinesis of the inferior wall; overall moderate to severe LV dysfunction.  2. Left ventricular ejection fraction, by estimation, is 30 to 35%. The left ventricle has moderate to severely decreased function. The left ventricle demonstrates regional wall motion abnormalities (see scoring diagram/findings for description). The left ventricular internal cavity size was mildly dilated. Left ventricular diastolic parameters are consistent with Grade II diastolic dysfunction (pseudonormalization). Elevated left atrial pressure.  3. Right ventricular systolic function is normal. The right ventricular size is normal.  4. Left atrial size was moderately dilated.  5. The mitral valve is normal in structure. Mild mitral valve regurgitation. No evidence of mitral stenosis.  6. The aortic valve is tricuspid. Aortic valve regurgitation is not visualized. No aortic stenosis is present.  7. The inferior vena cava is normal in size with greater than 50% respiratory variability, suggesting right atrial pressure of 3 mmHg. Conclusion(s)/Recommendation(s): Findings consistent with Cor Pulmonale. FINDINGS  Left Ventricle: Left ventricular ejection fraction, by estimation, is 30 to 35%. The left ventricle has moderate to severely decreased function. The left ventricle  demonstrates regional wall motion abnormalities. The left ventricular internal cavity size was mildly dilated. There is no left ventricular hypertrophy. Left ventricular diastolic parameters are consistent with Grade II diastolic dysfunction (pseudonormalization). Elevated left atrial pressure. Right Ventricle: The right ventricular size is normal.Right ventricular systolic function is normal. Left Atrium: Left atrial size was moderately dilated. Right Atrium: Right atrial size was normal in size. Pericardium: There is no evidence of pericardial effusion. Mitral Valve: The mitral valve is normal in structure. Mild mitral valve regurgitation. No evidence of mitral valve stenosis. Tricuspid Valve: The tricuspid valve is normal in structure. Tricuspid valve  regurgitation is trivial. No evidence of tricuspid stenosis. Aortic Valve: The aortic valve is tricuspid. Aortic valve regurgitation is not visualized. No aortic stenosis is present. Pulmonic Valve: The pulmonic valve was normal in structure. Pulmonic valve regurgitation is not visualized. No evidence of pulmonic stenosis. Aorta: The aortic root is normal in size and structure. Venous: The inferior vena cava is normal in size with greater than 50% respiratory variability, suggesting right atrial pressure of 3 mmHg. IAS/Shunts: No atrial level shunt detected by color flow Doppler. Additional Comments: Global hypokinesis with akinesis of the inferior wall; overall moderate to severe LV dysfunction.  LEFT VENTRICLE PLAX 2D LVIDd:         5.90 cm      Diastology LVIDs:         4.70 cm      LV e' medial:    4.03 cm/s LV PW:         0.90 cm      LV E/e' medial:  20.2 LV IVS:        0.90 cm      LV e' lateral:   5.52 cm/s LVOT diam:     2.30 cm      LV E/e' lateral: 14.7 LV SV:         88 LV SV Index:   51 LVOT Area:     4.15 cm  LV Volumes (MOD) LV vol d, MOD A2C: 252.0 ml LV vol d, MOD A4C: 233.0 ml LV vol s, MOD A2C: 170.0 ml LV vol s, MOD A4C: 139.0 ml LV SV MOD A2C:      82.0 ml LV SV MOD A4C:     233.0 ml LV SV MOD BP:      85.8 ml RIGHT VENTRICLE RV S prime:     11.60 cm/s TAPSE (M-mode): 1.8 cm LEFT ATRIUM           Index       RIGHT ATRIUM           Index LA diam:      4.70 cm 2.71 cm/m  RA Area:     12.50 cm LA Vol (A2C): 58.9 ml 33.96 ml/m RA Volume:   33.00 ml  19.03 ml/m LA Vol (A4C): 69.3 ml 39.96 ml/m  AORTIC VALVE LVOT Vmax:   132.00 cm/s LVOT Vmean:  76.700 cm/s LVOT VTI:    0.212 m  AORTA Ao Root diam: 3.20 cm MITRAL VALVE MV Area (PHT): 5.16 cm    SHUNTS MV Decel Time: 147 msec    Systemic VTI:  0.21 m MR Peak grad: 109.0 mmHg   Systemic Diam: 2.30 cm MR Mean grad: 68.0 mmHg MR Vmax:      522.00 cm/s MR Vmean:     390.0 cm/s MV E velocity: 81.30 cm/s MV A velocity: 42.20 cm/s MV E/A ratio:  1.93 Kirk Ruths MD Electronically signed by Kirk Ruths MD Signature Date/Time: 06/14/2020/11:52:13 AM    Final    Medications:  . aspirin EC  81 mg Oral Daily  . atorvastatin  80 mg Oral Daily  . Chlorhexidine Gluconate Cloth  6 each Topical Q0600  . cinacalcet  30 mg Oral Q breakfast  . docusate sodium  100 mg Oral BID  . famotidine  20 mg Oral Once per day on Mon Wed Fri  . guaiFENesin  1,200 mg Oral Daily  . heparin  5,000 Units Subcutaneous Q8H  . insulin aspart  0-6 Units Subcutaneous TID WC  . mirtazapine  15 mg Oral QHS  . multivitamin  1 tablet Oral QHS  . predniSONE  10 mg Oral Q breakfast  . [START ON 06/17/2020] predniSONE  5 mg Oral Q breakfast  . sodium chloride flush  3 mL Intravenous Q12H  . tamsulosin  0.4 mg Oral Daily  . ticagrelor  90 mg Oral BID  . tiotropium  18 mcg Inhalation Daily

## 2020-06-16 DIAGNOSIS — R0609 Other forms of dyspnea: Secondary | ICD-10-CM | POA: Diagnosis not present

## 2020-06-16 DIAGNOSIS — I5043 Acute on chronic combined systolic (congestive) and diastolic (congestive) heart failure: Secondary | ICD-10-CM

## 2020-06-16 DIAGNOSIS — I25119 Atherosclerotic heart disease of native coronary artery with unspecified angina pectoris: Secondary | ICD-10-CM

## 2020-06-16 DIAGNOSIS — Z992 Dependence on renal dialysis: Secondary | ICD-10-CM

## 2020-06-16 DIAGNOSIS — D631 Anemia in chronic kidney disease: Secondary | ICD-10-CM

## 2020-06-16 DIAGNOSIS — N186 End stage renal disease: Secondary | ICD-10-CM | POA: Diagnosis not present

## 2020-06-16 LAB — CBC
HCT: 30 % — ABNORMAL LOW (ref 39.0–52.0)
Hemoglobin: 9.1 g/dL — ABNORMAL LOW (ref 13.0–17.0)
MCH: 28.6 pg (ref 26.0–34.0)
MCHC: 30.3 g/dL (ref 30.0–36.0)
MCV: 94.3 fL (ref 80.0–100.0)
Platelets: 200 10*3/uL (ref 150–400)
RBC: 3.18 MIL/uL — ABNORMAL LOW (ref 4.22–5.81)
RDW: 18.5 % — ABNORMAL HIGH (ref 11.5–15.5)
WBC: 8.7 10*3/uL (ref 4.0–10.5)
nRBC: 0 % (ref 0.0–0.2)

## 2020-06-16 LAB — RENAL FUNCTION PANEL
Albumin: 2.7 g/dL — ABNORMAL LOW (ref 3.5–5.0)
Anion gap: 12 (ref 5–15)
BUN: 54 mg/dL — ABNORMAL HIGH (ref 8–23)
CO2: 26 mmol/L (ref 22–32)
Calcium: 8.2 mg/dL — ABNORMAL LOW (ref 8.9–10.3)
Chloride: 98 mmol/L (ref 98–111)
Creatinine, Ser: 5.18 mg/dL — ABNORMAL HIGH (ref 0.61–1.24)
GFR, Estimated: 12 mL/min — ABNORMAL LOW (ref >60)
Glucose, Bld: 136 mg/dL — ABNORMAL HIGH (ref 70–99)
Phosphorus: 2.6 mg/dL (ref 2.5–4.6)
Potassium: 3.6 mmol/L (ref 3.5–5.1)
Sodium: 136 mmol/L (ref 135–145)

## 2020-06-16 LAB — GLUCOSE, CAPILLARY
Glucose-Capillary: 141 mg/dL — ABNORMAL HIGH (ref 70–99)
Glucose-Capillary: 394 mg/dL — ABNORMAL HIGH (ref 70–99)
Glucose-Capillary: 299 mg/dL — ABNORMAL HIGH (ref 70–99)

## 2020-06-16 LAB — MAGNESIUM: Magnesium: 1.8 mg/dL (ref 1.7–2.4)

## 2020-06-16 MED ORDER — TRAZODONE HCL 50 MG PO TABS: 25.0000 mg | ORAL_TABLET | Freq: Every evening | ORAL | Status: DC | PRN

## 2020-06-16 NOTE — Progress Notes (Signed)
Subjective: Dialysis today instead of yesterday on schedule secondary to patient load and lack of HD RNs , seen currently at end of dialysis [place]  no complaints  Objective Vital signs in last 24 hours: Vitals:   06/16/20 1030 06/16/20 1100 06/16/20 1130 06/16/20 1200  BP: (!) 126/31 (!) 96/30 (!) 106/35 (!) 108/29  Pulse:      Resp: '19 15 12 12  '$ Temp:      TempSrc:      SpO2:      Weight:      Height:       Weight change:   Physical Exam: General: Thin adult male chronically ill-appearing NAD Heart: RRR no MRG Lungs: Decreased breath sounds at bases otherwise CTA nonlabored Abdomen: BC normal active, ND NT Extremities: No pedal edema Dialysis Access: LUA AVF  patent on HD   Dialysis Orders: [place]  MWF 3h 15 min 450/500 EDW 60.5kg 2K/2.5Ca  AVF No heparin  Sensipar 60 TIW Hectorol 2 TIW Mircera 200 (last 3/25)   Problem/Plan: 1. Dyspnea--Hx COPD, CHF -Pulm edema on CXR 3/26.  HD x2 since admission with 3.4 L and 3.3 L UF and now  below EDW, dyspnea resolved HD today off schedule dt patient load, lack of HD RN   2. ESRD -HD MWF.Had HD 3/26 and 3/27 secondary to volume overload, HD today secondary to above, attempt dialysis tomorrow 3-hour schedule to keep on schedule okay for discharge per renal 3. Hypertension/volume - BP ok.  Below EDW by 3 kg likely 57-56 kg follow-up weight today. 4. Anemia - Hgb 11.2 >9.1 recent ESA dose as outpatient, TSAT 21% with ferritin over 7500, not on iron 5. Metabolic bone disease -calcium 9.2 corrected continue  Sensipar, Hectorol.  Phosphorus 2.6 not on binders 6. CHF --Echo 3/27= EF 30 to 35% with moderate severe left dysfunction global hypokinesis akinesis inferior wall, mild MR, findings consistent with cor pulmonale 7. COPD/chronic resp failure -- On 3L home O2, O2 sat now 100% 2.5 L [place]  Ernest Haber, PA-C [place] [phone] 06/16/2020,12:42 PM  LOS: 3 days   Labs: Basic Metabolic  Panel: Recent Labs  Lab 06/14/20 0253 06/15/20 0814 06/16/20 0500  NA 137 138 136  K 3.6 3.6 3.6  CL 98 98 98  CO2 '29 29 26  '$ GLUCOSE 237* 172* 136*  BUN 18 27* 54*  CREATININE 2.58* 3.37* 5.18*  CALCIUM 9.3 9.0 8.2*  PHOS  --  2.1* 2.6   Liver Function Tests: Recent Labs  Lab 06/13/20 1410 06/15/20 0814 06/16/20 0500  AST 34  --   --   ALT 37  --   --   ALKPHOS 123  --   --   BILITOT 1.3*  --   --   PROT 6.6  --   --   ALBUMIN 2.9* 3.2* 2.7*   No results for input(s): LIPASE, AMYLASE in the last 168 hours. No results for input(s): AMMONIA in the last 168 hours. CBC: Recent Labs  Lab 06/13/20 1410 06/14/20 0253 06/15/20 0814 06/16/20 0500  WBC 6.7 8.3 7.8 8.7  NEUTROABS 5.3  --   --   --   HGB 9.0* 9.9* 11.2* 9.1*  HCT 30.0* 31.7* 38.5* 30.0*  MCV 97.7 95.5 98.2 94.3  PLT 170 197 219 200   Cardiac Enzymes: No results for input(s): CKTOTAL, CKMB, CKMBINDEX, TROPONINI in the last 168 hours. CBG: Recent Labs  Lab 06/15/20 0731 06/15/20 1157 06/15/20 1700 06/15/20 2130 06/16/20 0800  GLUCAP 146* 281* 355* 241* 141*    Studies/Results: No results found. Medications:  . aspirin EC  81 mg Oral Daily  . atorvastatin  80 mg Oral Daily  . Chlorhexidine Gluconate Cloth  6 each Topical Q0600  . cinacalcet  30 mg Oral Q breakfast  . docusate sodium  100 mg Oral BID  . famotidine  20 mg Oral Once per day on Mon Wed Fri  . guaiFENesin  1,200 mg Oral Daily  . heparin  5,000 Units Subcutaneous Q8H  . insulin aspart  0-6 Units Subcutaneous TID WC  . mirtazapine  15 mg Oral QHS  . multivitamin  1 tablet Oral QHS  . predniSONE  10 mg Oral Q breakfast  . [START ON 06/17/2020] predniSONE  5 mg Oral Q breakfast  . sodium chloride flush  3 mL Intravenous Q12H  . tamsulosin  0.4 mg Oral Daily  . ticagrelor  90 mg Oral BID  . tiotropium  18 mcg Inhalation Daily

## 2020-06-16 NOTE — Progress Notes (Signed)
Notified MD Girguis that pt refuses insulin via secure chat.Carb modified diet was added to renal diet per MD.   Paulla Fore, RN, BSN

## 2020-06-16 NOTE — Plan of Care (Signed)
  Problem: Education: Goal: Knowledge of General Education information will improve Description: Including pain rating scale, medication(s)/side effects and non-pharmacologic comfort measures Outcome: Progressing   Problem: Pain Managment: Goal: General experience of comfort will improve Outcome: Progressing   

## 2020-06-16 NOTE — Progress Notes (Signed)
PROGRESS NOTE    Flay Ribera   [id number]  DOB: 03-Mar-1956  DOA: 06/13/2020     3  PCP: Patient, No Pcp Per (Inactive)  CC: Shortness of breath  Hospital Course: Mr. Gersh is a 65 yo male with PMH ESRD on HD MWF, COPD, CAD who presented to the hospital with shortness of breath. Work-up was notable for vascular congestion seen on CXR.  There was concern for overall volume overload and he was continued on dialysis after admission.  Interval History:  Seen in room after dialysis this morning.  Breathing was improved after volume removal.  He had no other concerns when seen.  Hoping to discharge home soon.  ROS: Constitutional: negative for chills and fevers, Respiratory: negative for cough, Cardiovascular: negative for chest pain and Gastrointestinal: negative  Assessment & Plan: Acute on chronic combined systolic and diastolic CHF ESRD on HD MWF Chronic respiratory failure secondary to COPD and ILD -Vascular congestion seen on CXR with shortness of breath and cough on admission -Continue oxygen, wean as able -Improvement after dialysis today noted when seen - Echocardiogram ordered, shows 30 to 35% EF, no significant valvular abnormality.  Outpatient follow-up with primary cardiology.  Reported history of hemoptysis Currently resolved. H&H stable. Extensive evaluation during last admission. Monitor for now.  CAD and PVD - s/p PCI in December 2021. 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 On aspirin and Brilinta. Currently continuing same for now.  HLD Continue Lipitor.  Type 2 diabetes mellitus, controlled with renal dysfunction Holding oral hypoglycemic agent Continue sensitive sliding scale.  Severe protein calorie malnutrition Underweight. Initiate Nepro.  Will change diet to renal only. Body mass index is 18.52 kg/m.  Nutrition Problem: Severe Malnutrition Etiology: chronic illness (ESRD on HD, chronic CHF, COPD) Interventions: Interventions:  MVI,Magic cup,Nepro shake  Old records reviewed in assessment of this patient  Antimicrobials:   DVT prophylaxis: heparin injection 5,000 Units Start: 06/13/20 1400   Code Status:   Code Status: Full Code Family Communication:   Disposition Plan: Status is: Inpatient  Remains inpatient appropriate because:IV treatments appropriate due to intensity of illness or inability to take PO and Inpatient level of care appropriate due to severity of illness   Dispo: The patient is from: Home              Anticipated d/c is to: Home              Patient currently is not medically stable to d/c.   Difficult to place patient No  Risk of unplanned readmission score: Unplanned Admission- Pilot do not use: 31.18   Objective: Blood pressure (!) 105/22, pulse 90, temperature 99.2 F (37.3 C), temperature source Oral, resp. rate 19, height '5\' 9"'$  (1.753 m), weight 55.8 kg, SpO2 100 %.  Examination: General appearance: alert, cooperative and no distress Head: Normocephalic, without obvious abnormality, atraumatic Eyes: EOMI Lungs: Mild coarse breath sounds bilaterally Heart: regular rate and rhythm and S1, S2 normal Abdomen: normal findings: bowel sounds normal and soft, non-tender Extremities: No large edema appreciated Skin: mobility and turgor normal Neurologic: Grossly normal  Consultants:   Nephrology  Procedures:     Data Reviewed: I have personally reviewed following labs and imaging studies Results for orders placed or performed during the hospital encounter of 06/13/20 (from the past 24 hour(s))  Glucose, capillary     Status: Abnormal   Collection Time: 06/15/20  9:30 PM  Result Value Ref Range   Glucose-Capillary 241 (H) 70 - 99 mg/dL  CBC     Status: Abnormal   Collection Time: 06/16/20  5:00 AM  Result Value Ref Range   WBC 8.7 4.0 - 10.5 K/uL   RBC 3.18 (L) 4.22 - 5.81 MIL/uL   Hemoglobin 9.1 (L) 13.0 - 17.0 g/dL   HCT 30.0 (L) 39.0 - 52.0 %   MCV 94.3 80.0 -  100.0 fL   MCH 28.6 26.0 - 34.0 pg   MCHC 30.3 30.0 - 36.0 g/dL   RDW 18.5 (H) 11.5 - 15.5 %   Platelets 200 150 - 400 K/uL   nRBC 0.0 0.0 - 0.2 %  Renal function panel     Status: Abnormal   Collection Time: 06/16/20  5:00 AM  Result Value Ref Range   Sodium 136 135 - 145 mmol/L   Potassium 3.6 3.5 - 5.1 mmol/L   Chloride 98 98 - 111 mmol/L   CO2 26 22 - 32 mmol/L   Glucose, Bld 136 (H) 70 - 99 mg/dL   BUN 54 (H) 8 - 23 mg/dL   Creatinine, Ser 5.18 (H) 0.61 - 1.24 mg/dL   Calcium 8.2 (L) 8.9 - 10.3 mg/dL   Phosphorus 2.6 2.5 - 4.6 mg/dL   Albumin 2.7 (L) 3.5 - 5.0 g/dL   GFR, Estimated 12 (L) >60 mL/min   Anion gap 12 5 - 15  Magnesium     Status: None   Collection Time: 06/16/20  5:00 AM  Result Value Ref Range   Magnesium 1.8 1.7 - 2.4 mg/dL  Glucose, capillary     Status: Abnormal   Collection Time: 06/16/20  8:00 AM  Result Value Ref Range   Glucose-Capillary 141 (H) 70 - 99 mg/dL  Glucose, capillary     Status: Abnormal   Collection Time: 06/16/20  5:30 PM  Result Value Ref Range   Glucose-Capillary 394 (H) 70 - 99 mg/dL    No results found for this or any previous visit (from the past 240 hour(s)).   Radiology Studies: No results found. DG CHEST PORT 1 VIEW  Final Result    DG Outside Films Chest    (Results Pending)    Scheduled Meds: . aspirin EC  81 mg Oral Daily  . atorvastatin  80 mg Oral Daily  . Chlorhexidine Gluconate Cloth  6 each Topical Q0600  . cinacalcet  30 mg Oral Q breakfast  . docusate sodium  100 mg Oral BID  . famotidine  20 mg Oral Once per day on Mon Wed Fri  . guaiFENesin  1,200 mg Oral Daily  . heparin  5,000 Units Subcutaneous Q8H  . insulin aspart  0-6 Units Subcutaneous TID WC  . mirtazapine  15 mg Oral QHS  . multivitamin  1 tablet Oral QHS  . [START ON 06/17/2020] predniSONE  5 mg Oral Q breakfast  . sodium chloride flush  3 mL Intravenous Q12H  . tamsulosin  0.4 mg Oral Daily  . ticagrelor  90 mg Oral BID  . tiotropium  18  mcg Inhalation Daily   PRN Meds: acetaminophen **OR** acetaminophen, albuterol, bisacodyl, calcium carbonate (dosed in mg elemental calcium), camphor-menthol **AND** hydrOXYzine, docusate sodium, feeding supplement (NEPRO CARB STEADY), hydrALAZINE, ondansetron **OR** ondansetron (ZOFRAN) IV, oxyCODONE, polyethylene glycol, traZODone, zolpidem Continuous Infusions:   LOS: 3 days  Time spent: Greater than 50% of the 35 minute visit was spent in counseling/coordination of care for the patient as laid out in the A&P.   Dwyane Dee, MD [facility] 06/16/2020, 6:37 PM

## 2020-06-16 NOTE — Progress Notes (Signed)
OT Cancellation Note  Patient Details Name: Zabien Cuthbertson MRN: [id number] DOB: 04/25/1955   Cancelled Treatment:    Reason Eval/Treat Not Completed: Patient at procedure or test/ unavailable;Other (comment) pt at HD, will check back as time allows for OT session.  Harley Alto., COTA/L Acute Rehabilitation Services [phone]   Precious Haws 06/16/2020, 10:30 AM

## 2020-06-16 NOTE — Hospital Course (Signed)
Subjective: Seen on dialysis no complaints  Objective Vital signs in last 24 hours: Vitals:   06/16/20 1000 06/16/20 1030 06/16/20 1100 06/16/20 1130  BP: (!) 149/34 (!) 126/31 (!) 96/30 (!) 106/35  Pulse:      Resp: '12 19 15 12  '$ Temp:      TempSrc:      SpO2:      Weight:      Height:       Weight change:   Physical Exam: General: Thin adult male chronically ill-appearing NAD Heart: RRR no MRG Lungs: CTA anteriorly, nonlabored Abdomen: BC normal active, ND NT Extremities: No pedal edema Dialysis Access: LUA AVF patent on hemodialysis     Dialysis Orders:  [place]  MWF 3h 15 min 450/500 EDW 60.5kg 2K/2.5Ca  AVF No heparin  Sensipar 60 TIW Hectorol 2 TIW Mircera 200 (last 3/25)      Problem/Plan: Dyspnea --Hx COPD, CHF -Pulm edema on CXR 3/26.   HD x2 since admission with 3.4 L and 3.3 L UF and now  below EDW, dyspnea resolved HD today normal schedule  ESRD -  HD MWF. Had HD 3/26 and 3/27 secondary to volume overload, short 3-hour dialysis today keep on schedule Hypertension/volume  - BP ok.  Below EDW by 3 kg likely 57, 56 kg follow-up weight today.  Anemia  - Hgb 11.2 recent ESA dose as outpatient , TSAT 21% with ferritin over 7500, not on iron Metabolic bone disease -calcium 9.2 corrected continue  Sensipar, Hectorol.  Phosphorus 2.1 not on binders CHF --Echo 3/27= EF 30 to 35% with moderate severe left dysfunction global hypokinesis akinesis inferior wall, mild MR, findings consistent with cor pulmonale COPD/chronic resp failure -- On 3L home O2, O2 sat now 100% 2.5 L [place]  Ernest Haber, PA-C [facility] Bee[facility] [phone] 06/16/2020,12:05 PM  LOS: 3 days   Labs: Basic Metabolic Panel: Recent Labs  Lab 06/14/20 0253 06/15/20 0814 06/16/20 0500  NA 137 138 136  K 3.6 3.6 3.6  CL 98 98 98  CO2 '29 29 26  '$ GLUCOSE 237* 172* 136*  BUN 18 27* 54*  CREATININE 2.58* 3.37* 5.18*  CALCIUM 9.3 9.0 8.2*  PHOS  --  2.1* 2.6   Liver  Function Tests: Recent Labs  Lab 06/13/20 1410 06/15/20 0814 06/16/20 0500  AST 34  --   --   ALT 37  --   --   ALKPHOS 123  --   --   BILITOT 1.3*  --   --   PROT 6.6  --   --   ALBUMIN 2.9* 3.2* 2.7*   No results for input(s): LIPASE, AMYLASE in the last 168 hours. No results for input(s): AMMONIA in the last 168 hours. CBC: Recent Labs  Lab 06/13/20 1410 06/14/20 0253 06/15/20 0814 06/16/20 0500  WBC 6.7 8.3 7.8 8.7  NEUTROABS 5.3  --   --   --   HGB 9.0* 9.9* 11.2* 9.1*  HCT 30.0* 31.7* 38.5* 30.0*  MCV 97.7 95.5 98.2 94.3  PLT 170 197 219 200   Cardiac Enzymes: No results for input(s): CKTOTAL, CKMB, CKMBINDEX, TROPONINI in the last 168 hours. CBG: Recent Labs  Lab 06/15/20 0731 06/15/20 1157 06/15/20 1700 06/15/20 2130 06/16/20 0800  GLUCAP 146* 281* 355* 241* 141*    Studies/Results: No results found. Medications:    aspirin EC  81 mg Oral Daily   atorvastatin  80 mg Oral Daily   Chlorhexidine Gluconate Cloth  6  each Topical Q0600   cinacalcet  30 mg Oral Q breakfast   docusate sodium  100 mg Oral BID   famotidine  20 mg Oral Once per day on Mon Wed Fri   guaiFENesin  1,200 mg Oral Daily   heparin  5,000 Units Subcutaneous Q8H   insulin aspart  0-6 Units Subcutaneous TID WC   mirtazapine  15 mg Oral QHS   multivitamin  1 tablet Oral QHS   predniSONE  10 mg Oral Q breakfast   [START ON 06/17/2020] predniSONE  5 mg Oral Q breakfast   sodium chloride flush  3 mL Intravenous Q12H   tamsulosin  0.4 mg Oral Daily   ticagrelor  90 mg Oral BID   tiotropium  18 mcg Inhalation Daily

## 2020-06-16 NOTE — Care Management Important Message (Signed)
Important Message  Patient Details  Name: Brandon Chaney MRN: [id number] Date of Birth: 12-23-1955   Medicare Important Message Given:  Yes     Barb Merino [place] 06/16/2020, 12:20 PM

## 2020-06-16 NOTE — Progress Notes (Signed)
Notified MD Girguis via secure chat that CBG for dinner was 394. SS calls for 5 units.   Paulla Fore, RN, BSN

## 2020-06-16 NOTE — Progress Notes (Signed)
[facility] paged that patient had 6 beats V tach asymptomatic vital signs charted in Epic new orders received will continue to monitor, Arthor Captain LPN

## 2020-06-17 DIAGNOSIS — J9601 Acute respiratory failure with hypoxia: Secondary | ICD-10-CM | POA: Diagnosis not present

## 2020-06-17 LAB — CBC
HCT: 32.3 % — ABNORMAL LOW (ref 39.0–52.0)
Hemoglobin: 9.9 g/dL — ABNORMAL LOW (ref 13.0–17.0)
MCH: 28.9 pg (ref 26.0–34.0)
MCHC: 30.7 g/dL (ref 30.0–36.0)
MCV: 94.4 fL (ref 80.0–100.0)
Platelets: 196 10*3/uL (ref 150–400)
RBC: 3.42 MIL/uL — ABNORMAL LOW (ref 4.22–5.81)
RDW: 18.4 % — ABNORMAL HIGH (ref 11.5–15.5)
WBC: 6.7 10*3/uL (ref 4.0–10.5)
nRBC: 0 % (ref 0.0–0.2)

## 2020-06-17 LAB — RENAL FUNCTION PANEL
Albumin: 2.7 g/dL — ABNORMAL LOW (ref 3.5–5.0)
Anion gap: 13 (ref 5–15)
BUN: 45 mg/dL — ABNORMAL HIGH (ref 8–23)
CO2: 23 mmol/L (ref 22–32)
Calcium: 8.2 mg/dL — ABNORMAL LOW (ref 8.9–10.3)
Chloride: 96 mmol/L — ABNORMAL LOW (ref 98–111)
Creatinine, Ser: 4.41 mg/dL — ABNORMAL HIGH (ref 0.61–1.24)
GFR, Estimated: 14 mL/min — ABNORMAL LOW (ref >60)
Glucose, Bld: 246 mg/dL — ABNORMAL HIGH (ref 70–99)
Phosphorus: 3.4 mg/dL (ref 2.5–4.6)
Potassium: 4.3 mmol/L (ref 3.5–5.1)
Sodium: 132 mmol/L — ABNORMAL LOW (ref 135–145)

## 2020-06-17 LAB — MAGNESIUM: Magnesium: 1.8 mg/dL (ref 1.7–2.4)

## 2020-06-17 LAB — GLUCOSE, CAPILLARY
Glucose-Capillary: 215 mg/dL — ABNORMAL HIGH (ref 70–99)
Glucose-Capillary: 195 mg/dL — ABNORMAL HIGH (ref 70–99)

## 2020-06-17 NOTE — Progress Notes (Signed)
DISCHARGE NOTE HOME Kaydence Kester to be discharged Home per MD order. Discussed prescriptions and follow up appointments with the patient. Prescriptions given to patient; medication list explained in detail. Patient verbalized understanding.  Skin clean, dry and intact without evidence of skin break down, no evidence of skin tears noted. IV catheter discontinued intact. Site without signs and symptoms of complications. Dressing and pressure applied. Pt denies pain at the site currently. No complaints noted.  Patient free of lines, drains, and wounds.   An After Visit Summary (AVS) was printed and given to the patient. Patient escorted via wheelchair, and discharged home via private auto.  Orville Govern, RN3

## 2020-06-17 NOTE — TOC Transition Note (Signed)
Transition of Care (TOC) - CM/SW Discharge Note Marvetta Gibbons RN, BSN Transitions of Care Unit 4E- RN Case Manager See Treatment Team for direct phone # Cross Coverage foro [phone]    Patient Details  Name: Brandon Chaney MRN: [id number] Date of Birth: Jun 13, 1955  Transition of Care [facility]) CM/SW Contact:  Dawayne Patricia, RN Phone Number: 06/17/2020, 2:52 PM   Clinical Narrative:    Pt stable for transition home today with wife. Wife here to transport home. Per PT recs for HHPT- have requested HHPT orders from MD. CM in to speak with pt at bedside, wife also present. Pt agreeable to HHPT services- list provided for [facility] choice Per CMS guidelines from medicare.gov website with star ratings (copy placed in shadow chart), per pt and wife they do not have a preference for [facility] agency and will defer to this writer to secure an agency on pt's behalf.  Per pt he has [employer] that comes and assist him on HD days to get ready for HD- this is an aide that comes to assist. Info provided by therapy for Aging by Shirlee Limerick for in home shower needs- also discussed with wife calling the [place] for any DME or resources for walk in shower needs that the [place] may can provide.   Pt has all needed DME at home.   Address, phone # as well as wife's phone # (first contact requested), and PCP - [facility] Juleen China. - all info confirmed.   Call made to [place] with [facility] referral- referral has been accepted- plan for start of care visit this Friday April 1- call made to wife to share the info on [facility] agency that has been secured- msg left on voicemail with return contact info provided should she have any questions.    Final next level of care: Home w Home Health Services Barriers to Discharge: No Barriers Identified   Patient Goals and CMS Choice Patient states their goals for this hospitalization and ongoing recovery are:: return home with wife CMS Medicare.gov Compare Post Acute Care list  provided to:: Patient Choice offered to / list presented to : Patient,Spouse  Discharge Placement                 Home with [facility]      Discharge Plan and Services   Discharge Planning Services: CM Consult Post Acute Care Choice: Home Health          DME Arranged: N/A DME Agency: NA       HH Arranged: PT [place] Agency: [facility] [employer] Date [place]: 06/17/20 Time [place]: [place] Representative spoke with at [place]: [place]  Social Determinants of Health ([place]) Interventions     Readmission Risk Interventions Readmission Risk Prevention Plan 06/17/2020  Transportation Screening Complete  Medication Review Press photographer) Complete  PCP or Specialist appointment within 3-5 days of discharge Complete  HRI or [place] Complete  SW Recovery Care/Counseling Consult Complete  [place] Not Applicable

## 2020-06-17 NOTE — Progress Notes (Signed)
PT Cancellation Note  Patient Details Name: Arvle Prideaux MRN: [id number] DOB: Jun 05, 1955   Cancelled Treatment:    Reason Eval/Treat Not Completed: Patient at procedure or test/unavailable   Currently in HD;  Will follow up later today as time allows;  Otherwise, will follow up for PT tomorrow;   Thank you,  Roney Marion, PT  Acute Rehabilitation Services Pager [phone] Office [phone]     Colletta [place] 06/17/2020, 9:01 AM

## 2020-06-17 NOTE — Progress Notes (Signed)
Occupational Therapy Treatment Patient Details Name: Brandon Chaney MRN: [id number] DOB: [birth year] Today's Date: 06/17/2020    History of present illness 45 yom with ESRD, CAD, EF 20-25%, COPD, chronic 2-3 Lpm O2 requirement, treated for pneumonia recently and now p/w progressive SOB and orthopnea since hospital discharge 2 weeks prior to this admission. He is afebrile, saturating okay on his usual O2, but dyspneic with minimal exertion. CXR with diffuse b/l airspace disease and small pleural effusions. Troponin 90s and flat. pro-BNP beyond quantifiable limit. WBC normal. Hgb stable at 9.7. He had HD just prior to arrival in ED, diuresed and had slight improvement after IV Lasix in ED, but still SOB.   OT comments  Pt back from HD. Pt eager to dc home with wife (in room) Pt able to demonstrate UB and LB dressing at min guard/supervision level. Pt and wife asking about bathroom modifications and provided with handout for "Aging Gracefully" Pt performing transfers with RW and no physical assist needed, min guard/supervision for safety. Will have assist from wife at home. OT POC remains appropriate and Pt continues to decline HHOT at dc.    Follow Up Recommendations  No OT follow up    Equipment Recommendations  None recommended by OT    Recommendations for Other Services      Precautions / Restrictions Precautions Precautions: Fall (Pt. reports 3 falls in the past few months.)       Mobility Bed Mobility Overal bed mobility: Modified Independent                  Transfers Overall transfer level: Needs assistance Equipment used: Rolling walker (2 wheeled) Transfers: Sit to/from Stand Sit to Stand: Supervision         General transfer comment: cues for proper hand placement, and to self-monitor for activity tolerance    Balance                                           ADL either performed or assessed with clinical judgement   ADL Overall ADL's :  Needs assistance/impaired     Grooming: Wash/dry face;Oral care;Supervision/safety;Standing Grooming Details (indicate cue type and reason): at sink         Upper Body Dressing : Supervision/safety;Set up;Sitting Upper Body Dressing Details (indicate cue type and reason): to don shirt and jacket Lower Body Dressing: Minimal assistance;Sit to/from stand Lower Body Dressing Details (indicate cue type and reason): to don socks, underwear, pants, shoes Toilet Transfer: Supervision/safety;RW           Functional mobility during ADLs: Supervision/safety;Rolling walker       Vision       Perception     Praxis      Cognition Arousal/Alertness: Awake/alert Behavior During Therapy: WFL for tasks assessed/performed Overall Cognitive Status: Within Functional Limits for tasks assessed                                          Exercises     Shoulder Instructions       General Comments Wife present and provided "Aging Gracefully Handout"    Pertinent Vitals/ Pain       Pain Assessment: Faces Faces Pain Scale: Hurts a little bit Pain Location: Bil shoulder and hands; "I hve been hurting  fo so long . . . " Pain Descriptors / Indicators: [place]                                          Prior Functioning/Environment              Frequency  Min 2X/week        Progress Toward Goals  OT Goals(current goals can now be found in the care plan section)  Progress towards OT goals: Progressing toward goals  Acute Rehab OT Goals Patient Stated Goal: to get better OT Goal Formulation: With patient/family Time For Goal Achievement: 06/28/20 Potential to Achieve Goals: Good  Plan Discharge plan remains appropriate;Frequency remains appropriate    Co-evaluation                 AM-PAC OT "6 Clicks" Daily Activity     Outcome Measure   Help from another person eating meals?: None Help from another person taking  care of personal grooming?: A Little Help from another person toileting, which includes using toliet, bedpan, or urinal?: A Little Help from another person bathing (including washing, rinsing, drying)?: A Little Help from another person to put on and taking off regular upper body clothing?: A Little Help from another person to put on and taking off regular lower body clothing?: A Little 6 Click Score: 19    End of Session Equipment Utilized During Treatment: Rolling walker  OT Visit Diagnosis: Unsteadiness on feet (R26.81);Repeated falls (R29.6)   Activity Tolerance Patient tolerated treatment well   Patient Left in bed;with call bell/phone within reach;with bed alarm set   Nurse Communication Mobility status        Time: [id number] OT Time Calculation (min): 23 min  Charges: OT General Charges $OT Visit: 1 Visit OT Treatments $Self Care/Home Management : 8-22 mins $Therapeutic Activity: 8-22 mins  Jesse Sans OTR/L Acute Rehabilitation Services Pager: [phone] Office: [facility] 06/17/2020, 2:39 PM

## 2020-06-17 NOTE — Progress Notes (Signed)
OT Cancellation Note  Patient Details Name: Brandon Chaney MRN: [id number] DOB: June 17, 1955   Cancelled Treatment:    Reason Eval/Treat Not Completed: Patient at procedure or test/ unavailable (HD). OT will continue to follow acutely as schedule allows  Jaci Carrel 06/17/2020, 10:19 AM   Jesse Sans OTR/L Acute Rehabilitation Services Pager: [phone] Office: [phone]

## 2020-06-17 NOTE — Progress Notes (Signed)
Subjective: Seen in hemodialysis and no complaints day is normal HD schedule day  Objective Vital signs in last 24 hours: Vitals:   06/17/20 0832 06/17/20 0839 06/17/20 0900 06/17/20 0930  BP: (!) 144/61 (!) 147/56 130/61   Pulse: 77 83 80 77  Resp: 18     Temp: 98.2 F (36.8 C)     TempSrc: Oral     SpO2:   100% 100%  Weight: 58.1 kg     Height:       Weight change:    Physical Exam: General:Pleasant thin elderly  male chronically ill-appearing NAD Heart:RRR no MRG Lungs:Decreased breath sounds at bases otherwise CTA nonlabored Abdomen:BC normal active, ND NT Extremities:No pedal edema Dialysis Access:LUA AVF patent on HD   Dialysis Orders: [place]  MWF 3h 15 min 450/500 EDW 60.5kg 2K/2.5Ca  AVF No heparin  Sensipar 60 TIW Hectorol 2 TIW Mircera 200 (last 3/25)   Problem/Plan: 1. Dyspnea--Hx COPD, CHF -Pulm edema on CXR 3/26.HD x2 since admission with 3.4 L and 3.3 L UF and now below EDW,dyspnea resolved HD today off schedule dt patient load, lack of HD RN   2. ESRD -HD MWF.Had HD3/26 and 3/27 secondary to volume overload, HD today on schedule and okay from renal standpoint for discharge back to his [facility] HD area 3. Hypertension/volume - BP ok.Below EDW new OP EDW  likely 57-56 kg  4. Anemia - Hgb11.2 >9.9 recent ESA dose as outpatient,TSAT 21% with ferritin over 7500,not on iron 5. Metabolic bone disease -corrected calcium okay  continue Sensipar, Hectorol. Phosphorus 2.6 not on binders 6. CHF --Echo3/27=EF 30 to 35% with moderate severe left dysfunction global hypokinesis akinesis inferior wall,mild MR, findings consistent with cor pulmonale 7. COPD/chronic resp failure -- On 3L home O2,O2 sat now 100% 2.5 L [place]  Ernest Haber, PA-C [facility] Beeper [phone] 06/17/2020,9:36 AM  LOS: 4 days   Labs: Basic Metabolic Panel: Recent Labs  Lab 06/15/20 0814 06/16/20 0500 06/17/20 0417   NA 138 136 132*  K 3.6 3.6 4.3  CL 98 98 96*  CO2 '29 26 23  '$ GLUCOSE 172* 136* 246*  BUN 27* 54* 45*  CREATININE 3.37* 5.18* 4.41*  CALCIUM 9.0 8.2* 8.2*  PHOS 2.1* 2.6 3.4   Liver Function Tests: Recent Labs  Lab 06/13/20 1410 06/15/20 0814 06/16/20 0500 06/17/20 0417  AST 34  --   --   --   ALT 37  --   --   --   ALKPHOS 123  --   --   --   BILITOT 1.3*  --   --   --   PROT 6.6  --   --   --   ALBUMIN 2.9* 3.2* 2.7* 2.7*   No results for input(s): LIPASE, AMYLASE in the last 168 hours. No results for input(s): AMMONIA in the last 168 hours. CBC: Recent Labs  Lab 06/13/20 1410 06/14/20 0253 06/15/20 0814 06/16/20 0500 06/17/20 0417  WBC 6.7 8.3 7.8 8.7 6.7  NEUTROABS 5.3  --   --   --   --   HGB 9.0* 9.9* 11.2* 9.1* 9.9*  HCT 30.0* 31.7* 38.5* 30.0* 32.3*  MCV 97.7 95.5 98.2 94.3 94.4  PLT 170 197 219 200 196   Cardiac Enzymes: No results for input(s): CKTOTAL, CKMB, CKMBINDEX, TROPONINI in the last 168 hours. CBG: Recent Labs  Lab 06/15/20 2130 06/16/20 0800 06/16/20 1730 06/16/20 2048 06/17/20 0639  GLUCAP 241* 141* 394* 299* 215*  Studies/Results: No results found. Medications:  . aspirin EC  81 mg Oral Daily  . atorvastatin  80 mg Oral Daily  . Chlorhexidine Gluconate Cloth  6 each Topical Q0600  . cinacalcet  30 mg Oral Q breakfast  . docusate sodium  100 mg Oral BID  . famotidine  20 mg Oral Once per day on Mon Wed Fri  . guaiFENesin  1,200 mg Oral Daily  . heparin  5,000 Units Subcutaneous Q8H  . insulin aspart  0-6 Units Subcutaneous TID WC  . mirtazapine  15 mg Oral QHS  . multivitamin  1 tablet Oral QHS  . predniSONE  5 mg Oral Q breakfast  . sodium chloride flush  3 mL Intravenous Q12H  . tamsulosin  0.4 mg Oral Daily  . ticagrelor  90 mg Oral BID  . tiotropium  18 mcg Inhalation Daily

## 2020-06-17 NOTE — Discharge Summary (Signed)
Physician Discharge Summary   Brandon Chaney [id number] DOB: 1955/04/11 DOA: 06/13/2020  PCP: Patient, No Pcp Per (Inactive)  Admit date: 06/13/2020 Discharge date: 06/17/2020   Admitted From: home Disposition:  home Discharging physician: Dwyane Dee, MD  Recommendations for Outpatient Follow-up:  1. Continue dialysis   Patient discharged to home in Discharge Condition: stable Risk of unplanned readmission score: Unplanned Admission- Pilot do not use: 31.22  CODE STATUS: Full Diet recommendation:  Diet Orders (From admission, onward)    Start     Ordered   06/16/20 1812  Diet renal/carb modified with fluid restriction Diet-HS Snack? Nothing; Fluid restriction: 1800 mL Fluid; Room service appropriate? Yes; Fluid consistency: Thin  Diet effective now       Question Answer Comment  Diet-HS Snack? Nothing   Fluid restriction: 1800 mL Fluid   Room service appropriate? Yes   Fluid consistency: Thin      06/16/20 [place] [facility] Course:  Acute on chronic combined systolic and diastolic CHF ESRD on HD MWF Chronic respiratory failure secondary to COPD and ILD -Vascular congestion seen on CXR with shortness of breath and cough on admission - weaned to RA prior to d/c  -Improvement after dialysis - Echocardiogram ordered,shows 30 to 35% EF, no significant valvular abnormality. Outpatient follow-up with primary cardiology.  Reported history of hemoptysis Currently resolved. H&H stable. Extensive evaluation during last admission.  CAD and PVD - s/p PCI in December 2021. 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 On aspirin and Brilinta.  HLD Continue Lipitor.  Type 2 diabetes mellitus, controlled with renal dysfunction - continue home regimen at d/c  Severe protein calorie malnutrition Body mass index is 18.52 kg/m. Nutrition Problem: Severe Malnutrition Etiology: chronic illness (ESRD on HD, chronic CHF, COPD) Interventions: Interventions:  MVI,Magic cup,Nepro shake in hospital - continue ongoing encouragement upon d/c for improved nutrition    The patient's chronic medical conditions were treated accordingly per the patient's home medication regimen except as noted.  On day of discharge, patient was felt deemed stable for discharge. Patient/family member advised to call PCP or come back to ER if needed.   Principal Diagnosis: Acute respiratory failure with hypoxia [facility])  Discharge Diagnoses: Active Hospital Problems   Diagnosis Date Noted  . Acute on chronic combined systolic and diastolic CHF (congestive heart failure) ([place]) 06/13/2020  . Dyslipidemia 06/13/2020  . Type II diabetes mellitus, well controlled ([place]) 06/13/2020  . Anemia due to chronic kidney disease 06/13/2020  . ESRD (end stage renal disease) ([place]) 05/19/2020  . CAD (coronary artery disease) 05/19/2020  . COPD (chronic obstructive pulmonary disease) ([place]) 05/19/2020    Resolved Hospital Problems   Diagnosis Date Noted Date Resolved  . Acute respiratory failure with hypoxia ([place]) 05/19/2020 06/17/2020    Priority: High  . Other forms of dyspnea 06/13/2020 06/17/2020    Discharge Instructions    Increase activity slowly   Complete by: As directed      Allergies as of 06/17/2020      Reactions   Duragesic-100 [fentanyl] Nausea Only   Sulfa Antibiotics    Heart flutters/itching      Medication List    TAKE these medications   acidophilus Caps capsule Take 1 capsule by mouth daily.   albuterol (2.5 MG/3ML) 0.083% nebulizer solution Commonly known as: PROVENTIL Take 2.5 mg by nebulization every 6 (six) hours as needed for wheezing or shortness of breath.   aspirin EC 81 MG tablet Take 81  mg by mouth daily. Swallow whole.   atorvastatin 80 MG tablet Commonly known as: LIPITOR Take 80 mg by mouth daily.   cinacalcet 30 MG tablet Commonly known as: SENSIPAR Take 30 mg by mouth daily.   diphenhydrAMINE 25 mg capsule Commonly known as:  BENADRYL Take 25 mg by mouth every 6 (six) hours as needed for itching.   famotidine 20 MG tablet Commonly known as: PEPCID Take 20 mg by mouth every Monday, Wednesday, and Friday.   ferrous sulfate 325 (65 FE) MG tablet Take 325 mg by mouth daily with breakfast.   glipiZIDE 5 MG tablet Commonly known as: GLUCOTROL Take 0.5 tablets (2.5 mg total) by mouth daily before breakfast.   guaiFENesin 600 MG 12 hr tablet Commonly known as: MUCINEX Take 1,200 mg by mouth daily.   mirtazapine 15 MG tablet Commonly known as: REMERON Take 15 mg by mouth at bedtime.   multivitamin Tabs tablet Take 1 tablet by mouth at bedtime.   predniSONE 5 MG tablet Commonly known as: DELTASONE Take 1 tablet (5 mg total) by mouth daily with breakfast. --> Take '40mg'$  daily through 06/02/20, then, --> Take '20mg'$  daily from 06/03/20 to 06/09/20, then, --> Take '10mg'$  daily from 06/10/20 to 06/16/20, then, --> Take '5mg'$  daily from 06/17/20 to 06/23/20.   tamsulosin 0.4 MG Caps capsule Commonly known as: FLOMAX Take 0.4 mg by mouth daily.   ticagrelor 90 MG Tabs tablet Commonly known as: BRILINTA Take 90 mg by mouth 2 (two) times daily.   tiotropium 18 MCG inhalation capsule Commonly known as: SPIRIVA Place 18 mcg into inhaler and inhale daily.       Follow-up Information    [facility] [place] Follow up.   Specialty: [place] Why: HHPT arranged- they will contact you for start of care- plan for [facility] visit on Friday 4/1 Contact information: [address] [place] [address] [phone]              Allergies  Allergen Reactions  . Duragesic-100 [Fentanyl] Nausea Only  . Sulfa Antibiotics     Heart flutters/itching    Consultations: Nephrology   Discharge Exam: BP (!) 148/48   Pulse 80   Temp 98.5 F (36.9 C)   Resp 16   Ht '5\' 9"'$  (1.753 m)   Wt 56.5 kg   SpO2 99%   BMI 18.39 kg/m  General appearance: alert, cooperative and no distress Head: Normocephalic,  without obvious abnormality, atraumatic Eyes: EOMI Lungs: Mild coarse breath sounds bilaterally Heart: regular rate and rhythm and S1, S2 normal Abdomen: normal findings: bowel sounds normal and soft, non-tender Extremities: No large edema appreciated Skin: mobility and turgor normal Neurologic: Grossly normal  The results of significant diagnostics from this hospitalization (including imaging, microbiology, ancillary and laboratory) are listed below for reference.   Microbiology: No results found for this or any previous visit (from the past 240 hour(s)).   Labs: BNP (last 3 results) Recent Labs    06/13/20 1410  BNP [id number]*   Basic Metabolic Panel: Recent Labs  Lab 06/13/20 1410 06/14/20 0253 06/15/20 0814 06/16/20 0500 06/17/20 0417  NA 137 137 138 136 132*  K 3.4* 3.6 3.6 3.6 4.3  CL 98 98 98 98 96*  CO2 '27 29 29 26 23  '$ GLUCOSE 175* 237* 172* 136* 246*  BUN 44* 18 27* 54* 45*  CREATININE 4.50* 2.58* 3.37* 5.18* 4.41*  CALCIUM 7.8* 9.3 9.0 8.2* 8.2*  MG  --   --   --  1.8 1.8  PHOS  --   --  2.1* 2.6 3.4   Liver Function Tests: Recent Labs  Lab 06/13/20 1410 06/15/20 0814 06/16/20 0500 06/17/20 0417  AST 34  --   --   --   ALT 37  --   --   --   ALKPHOS 123  --   --   --   BILITOT 1.3*  --   --   --   PROT 6.6  --   --   --   ALBUMIN 2.9* 3.2* 2.7* 2.7*   No results for input(s): LIPASE, AMYLASE in the last 168 hours. No results for input(s): AMMONIA in the last 168 hours. CBC: Recent Labs  Lab 06/13/20 1410 06/14/20 0253 06/15/20 0814 06/16/20 0500 06/17/20 0417  WBC 6.7 8.3 7.8 8.7 6.7  NEUTROABS 5.3  --   --   --   --   HGB 9.0* 9.9* 11.2* 9.1* 9.9*  HCT 30.0* 31.7* 38.5* 30.0* 32.3*  MCV 97.7 95.5 98.2 94.3 94.4  PLT 170 197 219 200 196   Cardiac Enzymes: No results for input(s): CKTOTAL, CKMB, CKMBINDEX, TROPONINI in the last 168 hours. BNP: Invalid input(s): POCBNP CBG: Recent Labs  Lab 06/16/20 0800 06/16/20 1730 06/16/20 2048  06/17/20 0639 06/17/20 1231  GLUCAP 141* 394* 299* 215* 195*   D-Dimer No results for input(s): DDIMER in the last 72 hours. Hgb A1c No results for input(s): HGBA1C in the last 72 hours. Lipid Profile No results for input(s): CHOL, HDL, LDLCALC, TRIG, CHOLHDL, LDLDIRECT in the last 72 hours. Thyroid function studies No results for input(s): TSH, T4TOTAL, T3FREE, THYROIDAB in the last 72 hours.  Invalid input(s): FREET3 Anemia work up No results for input(s): VITAMINB12, FOLATE, FERRITIN, TIBC, IRON, RETICCTPCT in the last 72 hours. Urinalysis    Component Value Date/Time   COLORURINE AMBER (A) 05/19/2020 1344   APPEARANCEUR CLEAR 05/19/2020 1344   LABSPEC 1.020 05/19/2020 1344   PHURINE 6.0 05/19/2020 1344   GLUCOSEU 50 (A) 05/19/2020 1344   HGBUR NEGATIVE 05/19/2020 1344   BILIRUBINUR NEGATIVE 05/19/2020 1344   KETONESUR 5 (A) 05/19/2020 1344   PROTEINUR >=300 (A) 05/19/2020 1344   NITRITE NEGATIVE 05/19/2020 1344   LEUKOCYTESUR NEGATIVE 05/19/2020 1344   Sepsis Labs Invalid input(s): PROCALCITONIN,  WBC,  LACTICIDVEN Microbiology No results found for this or any previous visit (from the past 240 hour(s)).  Procedures/Studies: DG Chest 1 View  Result Date: 05/20/2020 CLINICAL DATA:  Post right thoracentesis. EXAM: CHEST  1 VIEW COMPARISON:  05/19/2020. FINDINGS: Heart size stable. Diffuse bilateral pulmonary infiltrates/edema with slight interim improvement from prior exam. Improvement right pleural effusion. Stable small left pleural effusion. No pneumothorax post thoracentesis. Bilateral upper extremity stents noted. Peripheral vascular calcification. IMPRESSION: 1. No pneumothorax post thoracentesis. 2. Diffuse bilateral pulmonary infiltrates/edema with slight interim improvement from prior exam. Improvement and right pleural effusion. Stable small left pleural effusion. Electronically Signed   By: Marcello Moores  Register   On: 05/20/2020 15:42   CT CHEST WO CONTRAST  Result  Date: 05/22/2020 CLINICAL DATA:  Follow-up pneumonia EXAM: CT CHEST WITHOUT CONTRAST TECHNIQUE: Multidetector CT imaging of the chest was performed following the standard protocol without IV contrast. COMPARISON:  Plain film from 05/20/2020 FINDINGS: Cardiovascular: Somewhat limited due to lack of IV contrast. Mild decreased attenuation of the cardiac blood pool is noted consistent with underlying anemia. Diffuse coronary and aortic calcifications are noted. No aneurysmal dilatation is seen. Vascular stenting is noted in the right arm. Mediastinum/Nodes:  Thoracic inlet is within normal limits. Scattered small hilar and mediastinal lymph nodes are seen likely reactive in nature. Lungs/Pleura: Bilateral pleural effusions are noted right considerably greater than left. Right lower lobe consolidation is noted consistent with acute pneumonia. Additionally changes of central vascular congestion and edema are noted similar to that seen on prior plain film examination. This may be related to a degree of volume overload. Upper Abdomen: Visualized upper abdomen shows no acute abnormality. Musculoskeletal: No acute bony abnormality is noted. Degenerative changes of the thoracic spine are seen. 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 IMPRESSION: Right lower lobe pneumonia. Bilateral pleural effusions and findings suggestive of CHF similar to that noted on prior plain film examination. Aortic Atherosclerosis (ICD10-I70.0). Electronically Signed   By: Inez Catalina M.D.   On: 05/22/2020 05:21   NM Pulmonary Perfusion  Result Date: 05/19/2020 CLINICAL DATA:  Chest pain, shortness of breath and elevated D-dimer. The patient has pneumonia. EXAM: NUCLEAR MEDICINE PERFUSION LUNG SCAN TECHNIQUE: Perfusion images were obtained in multiple projections after intravenous injection of radiopharmaceutical. Ventilation scans intentionally deferred if perfusion scan and chest x-ray adequate for interpretation during COVID  19 epidemic. RADIOPHARMACEUTICALS:  4.4 mCi Tc-27mMAA IV COMPARISON:  Single-view of the chest earlier today. FINDINGS: Small nonsegmental defects are seen in the upper lobes bilaterally. Defect on the right corresponds with finding on chest film. No definite correlation with defect on the left is seen. Perfusion is otherwise normal. IMPRESSION: Small nonsegmental defects in the upper lobes bilaterally are likely related to the patient's pneumonia rather than pulmonary embolus. Electronically Signed   By: TInge RiseM.D.   On: 05/19/2020 14:43   DG CHEST PORT 1 VIEW  Result Date: 06/13/2020 CLINICAL DATA:  Shortness of breath, cough, end-stage renal disease on dialysis, COPD, coronary artery disease, type II diabetes mellitus, CHF EXAM: PORTABLE CHEST 1 VIEW COMPARISON:  Portable exam 1641 hours compared to 05/27/2020 FINDINGS: Enlargement of cardiac silhouette with vascular congestion. Atherosclerotic calcification aorta. Extensive BILATERAL pulmonary infiltrates consistent with increased pulmonary edema. RIGHT pleural effusion and basilar atelectasis, with probable tiny LEFT pleural effusion as well. No pneumothorax. Vascular stents at the axilla bilaterally. Scattered endplate spur formation thoracic spine. IMPRESSION: Increased pulmonary edema and RIGHT pleural effusion/basilar atelectasis since prior exam. Electronically Signed   By: MLavonia DanaM.D.   On: 06/13/2020 16:54   DG CHEST PORT 1 VIEW  Result Date: 05/27/2020 CLINICAL DATA:  Short of breath.  Dialysis patient. EXAM: PORTABLE CHEST 1 VIEW COMPARISON:  05/20/2020 FINDINGS: Diffuse bilateral airspace disease consistent with edema with interval improvement. Small pleural effusions bilaterally. Improvement on the left. No change on the right. Heart size upper normal. IMPRESSION: Improvement in pulmonary edema. Small bilateral pleural effusions, with improvement on the left. Electronically Signed   By: CFranchot GalloM.D.   On: 05/27/2020  17:57   DG CHEST PORT 1 VIEW  Result Date: 05/19/2020 CLINICAL DATA:  Pneumonia. EXAM: PORTABLE CHEST 1 VIEW COMPARISON:  One-view chest x-ray 05/19/2020 FINDINGS: Heart is enlarged. Atherosclerotic calcifications are present. Diffuse interstitial pattern is again seen. Moderate right pleural effusion present. No pneumothorax. Bibasilar airspace disease is worse right than left. IMPRESSION: 1. Cardiomegaly with diffuse interstitial pattern compatible with edema. Infection is not excluded. Patchy opacities seen on the prior study are less prominent. Pattern is more diffuse. 2. Moderate right pleural effusion. 3. Bibasilar airspace disease is worse on the right than left. Electronically Signed   By: CHarrell Gave  Mattern M.D.   On: 05/19/2020 10:47   DG CHEST PORT 1 VIEW  Result Date: 05/19/2020 CLINICAL DATA:  Sepsis, pneumonia, hemoptysis EXAM: PORTABLE CHEST 1 VIEW COMPARISON:  Portable exam 0515 hours without priors for comparison FINDINGS: Normal heart size and mediastinal contours. BILATERAL pulmonary infiltrates favor multifocal pneumonia, less likely edema Probable small RIGHT pleural effusion. No pneumothorax or acute osseous findings. Vascular stents at the axilla bilaterally. IMPRESSION: Diffuse BILATERAL pulmonary infiltrates favoring multifocal pneumonia. Electronically Signed   By: Lavonia Dana M.D.   On: 05/19/2020 08:22   DG Swallowing Func-Speech Pathology  Result Date: 05/25/2020 Objective Swallowing Evaluation: Type of Study: MBS-Modified Barium Swallow Study  Patient Details Name: Brandon Chaney MRN: [id number] Date of Birth: 1955-12-27 Today's Date: 05/25/2020 Time: SLP Start Time (ACUTE ONLY): 0859 -SLP Stop Time (ACUTE ONLY): [id number] SLP Time Calculation (min) (ACUTE ONLY): 12 min Past Medical History: Past Medical History: Diagnosis Date . CAD (coronary artery disease) 05/19/2020 . COPD (chronic obstructive pulmonary disease) ([place]) 05/19/2020 . ESRD (end stage renal disease) ([place]) 05/19/2020 . Herpes  zoster ophthalmicus, right eye  Past Surgical History: Past Surgical History: Procedure Laterality Date . ARTERIOVENOUS GRAFT PLACEMENT Right  . CORONARY ANGIOPLASTY   . IR THORACENTESIS ASP PLEURAL Chaney W/IMG GUIDE  05/20/2020 HPI: Pt is a 65 y.o. male with medical history significant for ESRD on hemodialysis, COPD, coronary artery disease with stents, history of zoster ophthalmicus, recent hospital admission for pneumonia and symptomatic anemia, and supplemental oxygen requirement since the recent hospitalization. Pt presented to the ED for evaluation of chest pain, shortness of breath, and persistent cough.  CT chest 3/4: Right lower lobe pneumonia. SLP consulted due to concern for aspiration.  Subjective: Pt pleasant and cooperative Assessment / Plan / Recommendation CHL IP CLINICAL IMPRESSIONS 05/25/2020 Clinical Impression Pt presents with an oropharyngeal swallow that is [facility]. One instance of penetration (PAS 2) was noted due to timing in which the bolus spilled under his epiglottis; however, the penetrates were cleared as the pt swallowed. Although penetration occured, his swallow still is efficient and safe with appropriate airway protection of all consistencies. Pt's subjective symptoms appear more esophageal so recomend follow up from GI if symptoms continue to persist. Provided education such as taking smaller sips and bites as well as sitting up right following PO intake to help with these subjective symptoms. Recomend that pt continuation a regular diet and thin liquids. SLP will sign off at this time. SLP Visit Diagnosis Dysphagia, unspecified (R13.10) Attention and concentration deficit following -- Frontal lobe and executive function deficit following -- Impact on safety and function Mild aspiration risk   CHL IP TREATMENT RECOMMENDATION 05/25/2020 Treatment Recommendations No treatment recommended at this time   Prognosis 05/25/2020 Prognosis for Safe Diet Advancement Good Barriers to Reach Goals --  Barriers/Prognosis Comment -- CHL IP DIET RECOMMENDATION 05/25/2020 SLP Diet Recommendations Regular solids;Thin liquid Liquid Administration via Cup;Straw Medication Administration Whole meds with liquid Compensations Slow rate;Small sips/bites Postural Changes Remain semi-upright after after feeds/meals (Comment);Seated upright at 90 degrees   CHL IP OTHER RECOMMENDATIONS 05/25/2020 Recommended Consults Consider GI evaluation Oral Care Recommendations Oral care BID Other Recommendations --   CHL IP FOLLOW UP RECOMMENDATIONS 05/25/2020 Follow up Recommendations None   CHL IP FREQUENCY AND DURATION 05/24/2020 Speech Therapy Frequency (ACUTE ONLY) min 1 x/week Treatment Duration 1 week      CHL IP ORAL PHASE 05/25/2020 Oral Phase WFL Oral - Pudding Teaspoon -- Oral - Pudding Cup -- Oral - Honey Teaspoon --  Oral - Honey Cup -- Oral - Nectar Teaspoon -- Oral - Nectar Cup -- Oral - Nectar Straw -- Oral - Thin Teaspoon -- Oral - Thin Cup -- Oral - Thin Straw -- Oral - Puree -- Oral - Mech Soft -- Oral - Regular -- Oral - Multi-Consistency -- Oral - Pill -- Oral Phase - Comment --  CHL IP PHARYNGEAL PHASE 05/25/2020 Pharyngeal Phase Impaired Pharyngeal- Pudding Teaspoon -- Pharyngeal -- Pharyngeal- Pudding Cup -- Pharyngeal -- Pharyngeal- Honey Teaspoon -- Pharyngeal -- Pharyngeal- Honey Cup -- Pharyngeal -- Pharyngeal- Nectar Teaspoon -- Pharyngeal -- Pharyngeal- Nectar Cup -- Pharyngeal -- Pharyngeal- Nectar Straw -- Pharyngeal -- Pharyngeal- Thin Teaspoon -- Pharyngeal -- Pharyngeal- Thin Cup WFL Pharyngeal -- Pharyngeal- Thin Straw Penetration/Aspiration before swallow Pharyngeal Material enters airway, remains ABOVE vocal cords then ejected out Pharyngeal- Puree WFL Pharyngeal -- Pharyngeal- Mechanical Soft -- Pharyngeal -- Pharyngeal- Regular WFL Pharyngeal -- Pharyngeal- Multi-consistency -- Pharyngeal -- Pharyngeal- Pill WFL Pharyngeal -- Pharyngeal Comment --  CHL IP CERVICAL ESOPHAGEAL PHASE 05/25/2020 Cervical Esophageal  Phase WFL Pudding Teaspoon -- Pudding Cup -- Honey Teaspoon -- Honey Cup -- Nectar Teaspoon -- Nectar Cup -- Nectar Straw -- Thin Teaspoon -- Thin Cup -- Thin Straw -- Puree -- Mechanical Soft -- Regular -- Multi-consistency -- Pill -- Cervical Esophageal Comment -- Note populated for Lebron Conners, Student SLP Osie Bond., M.A. [place] Pager [phone] Office [phone] 05/25/2020, 10:28 AM              ECHOCARDIOGRAM COMPLETE  Result Date: 06/14/2020    ECHOCARDIOGRAM REPORT   Patient Name:   Brandon Chaney Date of Exam: 06/14/2020 Medical Rec #:  [id number]      Height:       69.0 in Accession #:    [id number]     Weight:       132.5 lb Date of Birth:  08-02-1955      BSA:          1.734 m Patient Age:    17 years       BP:           156/39 mmHg Patient Gender: M              HR:           95 bpm. Exam Location:  Inpatient Procedure: 2D Echo, Cardiac Doppler and Color Doppler Indications:    CHF-Acute Systolic [id number] / [id number]  History:        Patient has no prior history of Echocardiogram examinations.                 CAD, COPD; Risk Factors:Diabetes and Dyslipidemia.  Sonographer:    Vickie Epley RDCS Referring Phys: [place]  1. Global hypokinesis with akinesis of the inferior wall; overall moderate to severe LV dysfunction.  2. Left ventricular ejection fraction, by estimation, is 30 to 35%. The left ventricle has moderate to severely decreased function. The left ventricle demonstrates regional wall motion abnormalities (see scoring diagram/findings for description). The left ventricular internal cavity size was mildly dilated. Left ventricular diastolic parameters are consistent with Grade II diastolic dysfunction (pseudonormalization). Elevated left atrial pressure.  3. Right ventricular systolic function is normal. The right ventricular size is normal.  4. Left atrial size was moderately dilated.  5. The mitral valve is normal in structure. Mild mitral  valve regurgitation. No evidence of mitral stenosis.  6. The aortic valve is tricuspid. Aortic valve regurgitation is  not visualized. No aortic stenosis is present.  7. The inferior vena cava is normal in size with greater than 50% respiratory variability, suggesting right atrial pressure of 3 mmHg. Conclusion(s)/Recommendation(s): Findings consistent with Cor Pulmonale. FINDINGS  Left Ventricle: Left ventricular ejection fraction, by estimation, is 30 to 35%. The left ventricle has moderate to severely decreased function. The left ventricle demonstrates regional wall motion abnormalities. The left ventricular internal cavity size was mildly dilated. There is no left ventricular hypertrophy. Left ventricular diastolic parameters are consistent with Grade II diastolic dysfunction (pseudonormalization). Elevated left atrial pressure. Right Ventricle: The right ventricular size is normal.Right ventricular systolic function is normal. Left Atrium: Left atrial size was moderately dilated. Right Atrium: Right atrial size was normal in size. Pericardium: There is no evidence of pericardial effusion. Mitral Valve: The mitral valve is normal in structure. Mild mitral valve regurgitation. No evidence of mitral valve stenosis. Tricuspid Valve: The tricuspid valve is normal in structure. Tricuspid valve regurgitation is trivial. No evidence of tricuspid stenosis. Aortic Valve: The aortic valve is tricuspid. Aortic valve regurgitation is not visualized. No aortic stenosis is present. Pulmonic Valve: The pulmonic valve was normal in structure. Pulmonic valve regurgitation is not visualized. No evidence of pulmonic stenosis. Aorta: The aortic root is normal in size and structure. Venous: The inferior vena cava is normal in size with greater than 50% respiratory variability, suggesting right atrial pressure of 3 mmHg. IAS/Shunts: No atrial level shunt detected by color flow Doppler. Additional Comments: Global hypokinesis with  akinesis of the inferior wall; overall moderate to severe LV dysfunction.  LEFT VENTRICLE PLAX 2D LVIDd:         5.90 cm      Diastology LVIDs:         4.70 cm      LV e' medial:    4.03 cm/s LV PW:         0.90 cm      LV E/e' medial:  20.2 LV IVS:        0.90 cm      LV e' lateral:   5.52 cm/s LVOT diam:     2.30 cm      LV E/e' lateral: 14.7 LV SV:         88 LV SV Index:   51 LVOT Area:     4.15 cm  LV Volumes (MOD) LV vol d, MOD A2C: 252.0 ml LV vol d, MOD A4C: 233.0 ml LV vol s, MOD A2C: 170.0 ml LV vol s, MOD A4C: 139.0 ml LV SV MOD A2C:     82.0 ml LV SV MOD A4C:     233.0 ml LV SV MOD BP:      85.8 ml RIGHT VENTRICLE RV S prime:     11.60 cm/s TAPSE (M-mode): 1.8 cm LEFT ATRIUM           Index       RIGHT ATRIUM           Index LA diam:      4.70 cm 2.71 cm/m  RA Area:     12.50 cm LA Vol (A2C): 58.9 ml 33.96 ml/m RA Volume:   33.00 ml  19.03 ml/m LA Vol (A4C): 69.3 ml 39.96 ml/m  AORTIC VALVE LVOT Vmax:   132.00 cm/s LVOT Vmean:  76.700 cm/s LVOT VTI:    0.212 m  AORTA Ao Root diam: 3.20 cm MITRAL VALVE MV Area (PHT): 5.16 cm    SHUNTS MV Decel Time: 147  msec    Systemic VTI:  0.21 m MR Peak grad: 109.0 mmHg   Systemic Diam: 2.30 cm MR Mean grad: 68.0 mmHg MR Vmax:      522.00 cm/s MR Vmean:     390.0 cm/s MV E velocity: 81.30 cm/s MV A velocity: 42.20 cm/s MV E/A ratio:  1.93 Kirk Ruths MD Electronically signed by Kirk Ruths MD Signature Date/Time: 06/14/2020/11:52:13 AM    Final    IR THORACENTESIS ASP PLEURAL Chaney W/IMG GUIDE  Result Date: 05/20/2020 INDICATION: Shortness of breath. Right pleural effusions. Request made for therapeutic and diagnostic thoracentesis. EXAM: ULTRASOUND GUIDED RIGHT THORACENTESIS MEDICATIONS: 10 mL 1% lidocaine COMPLICATIONS: None immediate. PROCEDURE: An ultrasound guided thoracentesis was thoroughly discussed with the patient and questions answered. The benefits, risks, alternatives and complications were also discussed. The patient understands and wishes  to proceed with the procedure. Written consent was obtained. Ultrasound was performed to localize and mark an adequate pocket of fluid in the right chest. The area was then prepped and draped in the normal sterile fashion. 1% Lidocaine was used for local anesthesia. Under ultrasound guidance a 6 Fr Safe-T-Centesis catheter was introduced. Thoracentesis was performed. The catheter was removed and a dressing applied. FINDINGS: A total of approximately 1.3 L of clear yellow fluid was removed. Samples were sent to the laboratory as requested by the clinical team. Post procedure chest X-ray reviewed, negative for pneumothorax. IMPRESSION: Successful ultrasound guided right thoracentesis yielding 1.3 L of pleural fluid. Read by: Durenda Guthrie, PA-C Electronically Signed   By: Sandi Mariscal M.D.   On: 05/20/2020 15:53     Time coordinating discharge: Over 30 minutes    Dwyane Dee, MD  [facility] 06/17/2020, 6:16 PM

## 2020-06-26 ENCOUNTER — Encounter: Payer: Self-pay | Admitting: Pulmonary Disease

## 2020-06-26 ENCOUNTER — Ambulatory Visit (INDEPENDENT_AMBULATORY_CARE_PROVIDER_SITE_OTHER): Payer: No Typology Code available for payment source | Admitting: Pulmonary Disease

## 2020-06-26 ENCOUNTER — Ambulatory Visit (INDEPENDENT_AMBULATORY_CARE_PROVIDER_SITE_OTHER): Payer: Medicare Other

## 2020-06-26 ENCOUNTER — Other Ambulatory Visit: Payer: Self-pay

## 2020-06-26 VITALS — BP 132/70 | HR 89 | Temp 98.5°F | Ht 69.0 in | Wt 138.8 lb

## 2020-06-26 DIAGNOSIS — J984 Other disorders of lung: Secondary | ICD-10-CM | POA: Diagnosis not present

## 2020-06-26 NOTE — Patient Instructions (Addendum)
We will try to get you scheduled for a sleep study through the [facility] and follow up on the results.   We will schedule you follow up in our clinic based on the sleep study results.   You are safe to stop the prednisone today. Call us if your cough returns.

## 2020-06-26 NOTE — Progress Notes (Signed)
Synopsis: Referred in April 2022 for hospital follow up due to hemoptysis  Subjective:   PATIENT ID: Brandon Chaney GENDER: male DOB: [birth year], MRN: [id number]   HPI  Chief Complaint  Patient presents with  . Hospitalization Follow-up    HFU for hemoptysis. States he has been doing well at home. Denies any new coughing up blood episodes.    Kjell Brannen is a 65 year old male never smoker with history of ESRD on HD, heart failure reduced EF and COPD who comes to pulmonary clinic for hospital follow up of hemoptysis.   He was admitted 3/26 to 3/30 for acute hypoxemic respiratory failure in setting of volume overload with pulmonary edema and pleural effusions secondary to ESRD and heart failure. He also had complaints of hemoptysis. The dyspnea improved with adequate diuresis and dialysis. He underwent a thoracentesis of the right pleural effusion which appeared to be transudative in nature based on total protein ratio. The hemoptysis was in the setting of recent pneumonia due to citrobacter koseri and dual antiplatelet therapy. He had hemosiderin laden macrophages on BAL results, consistent with capillaritis. He was treated with a prednisone steroid taper for 1 month. He reports resolution of his hemoptysis and his breathing is much better at today's visit. He was also trialed on CPAP therapy while inpatient with reported improvement in his sleep and breathing.    He is using spiriva daily and as needed albuterol inhalers.   He does report some dyspnea on exertion but mainly fatigue. 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   Past Medical History:  Diagnosis Date  . Anemia due to chronic kidney disease 06/13/2020  . CAD (coronary artery disease) 05/19/2020  . COPD (chronic obstructive pulmonary disease) ([place]) 05/19/2020  . Dyslipidemia 06/13/2020  . ESRD (end stage renal disease) ([place]) 05/19/2020  . Herpes zoster ophthalmicus, right eye   . 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 Type II diabetes mellitus, well controlled ([place]) 06/13/2020     Family History  Family history unknown: Yes     Social History   Socioeconomic History  . Marital status: Married    Spouse name: Not on file  . Number of children: Not on file  . Years of education: Not on file  . Highest education level: Not on file  Occupational History  . Occupation: Environmental education officer  Tobacco Use  . Smoking status: Never Smoker  . Smokeless tobacco: Never Used  . Tobacco comment: none in 30-40 years  Substance and Sexual Activity  . Alcohol use: Not Currently    Comment: none in 30-40 years  . Drug use: Not Currently    Types: Marijuana    Comment: many years ago  . Sexual activity: Not on file  Other Topics Concern  . Not on file  Social History Narrative  . Not on file   Social Determinants of Health   Financial Resource Strain: Not on file  Food Insecurity: Not on file  Transportation Needs: Not on file  Physical Activity: Not on file  Stress: Not on file  Social Connections: Not on file  Intimate Partner Violence: Not on file     Allergies  Allergen Reactions  . Duragesic-100 [Fentanyl] Nausea Only  . Sulfa Antibiotics     Heart flutters/itching     Outpatient Medications Prior to Visit  Medication Sig Dispense Refill  . acidophilus (RISAQUAD) CAPS capsule Take 1 capsule by mouth daily.    Marland Kitchen albuterol (PROVENTIL) (  2.5 MG/3ML) 0.083% nebulizer solution Take 2.5 mg by nebulization every 6 (six) hours as needed for wheezing or shortness of breath.    Marland Kitchen aspirin EC 81 MG tablet Take 81 mg by mouth daily. Swallow whole.    Marland Kitchen atorvastatin (LIPITOR) 80 MG tablet Take 80 mg by mouth daily.    . cinacalcet (SENSIPAR) 30 MG tablet Take 30 mg by mouth daily.    . diphenhydrAMINE (BENADRYL) 25 mg capsule Take 25 mg by mouth every 6 (six) hours as needed for itching.    . famotidine (PEPCID) 20 MG tablet Take 20 mg by mouth every Monday, Wednesday, and Friday.     . ferrous sulfate 325 (65 FE) MG tablet Take 325 mg by mouth daily with breakfast.    . glipiZIDE (GLUCOTROL) 5 MG tablet Take 0.5 tablets (2.5 mg total) by mouth daily before breakfast. 90 tablet 0  . guaiFENesin (MUCINEX) 600 MG 12 hr tablet Take 1,200 mg by mouth daily.    . mirtazapine (REMERON) 15 MG tablet Take 15 mg by mouth at bedtime.    . multivitamin (RENA-VIT) TABS tablet Take 1 tablet by mouth at bedtime. 180 tablet 0  . predniSONE (DELTASONE) 5 MG tablet Take 1 tablet (5 mg total) by mouth daily with breakfast. --> Take 61m daily through 06/02/20, then, --> Take 248mdaily from 06/03/20 to 06/09/20, then, --> Take 1078maily from 06/10/20 to 06/16/20, then, --> Take 5mg10mily from 06/17/20 to 06/23/20. 40 tablet 0  . tamsulosin (FLOMAX) 0.4 MG CAPS capsule Take 0.4 mg by mouth daily.    . ticagrelor (BRILINTA) 90 MG TABS tablet Take 90 mg by mouth 2 (two) times daily.    . tiMarland Kitchentropium (SPIRIVA) 18 MCG inhalation capsule Place 18 mcg into inhaler and inhale daily.     No facility-administered medications prior to visit.    Review of Systems  Constitutional: Positive for malaise/fatigue. Negative for chills, fever and weight loss.  HENT: Negative for congestion, sinus pain and sore throat.   Eyes: Negative.   Respiratory: Negative for cough, hemoptysis, sputum production, shortness of breath and wheezing.   Cardiovascular: Negative for chest pain, palpitations, orthopnea, claudication and leg swelling.  Gastrointestinal: Negative for abdominal pain, heartburn, nausea and vomiting.  Genitourinary: Negative.   Musculoskeletal: Negative for joint pain and myalgias.  Skin: Negative for rash.  Neurological: Negative for weakness.  Endo/Heme/Allergies: Negative.   Psychiatric/Behavioral: Negative.     Objective:   Vitals:   06/26/20 0845  BP: 132/70  Pulse: 89  Temp: 98.5 F (36.9 C)  TempSrc: Temporal  SpO2: 94%  Weight: 138 lb 12.8 oz (63 kg)  Height: _0  (1.753 m)      Physical Exam Constitutional:      General: He is not in acute distress.    Appearance: Normal appearance. He is not ill-appearing.  HENT:     Head: Normocephalic and atraumatic.     Mouth/Throat:     Mouth: Mucous membranes are moist.     Pharynx: Oropharynx is clear.  Eyes:     Extraocular Movements: Extraocular movements intact.     Conjunctiva/sclera: Conjunctivae normal.     Pupils: Pupils are equal, round, and reactive to light.  Cardiovascular:     Rate and Rhythm: Normal rate and regular rhythm.     Pulses: Normal pulses.     Heart sounds: Normal heart sounds. No murmur heard.   Pulmonary:     Effort: Pulmonary effort is normal.  Breath sounds: Rales (mild, bilateral bases) present.  Abdominal:     General: Bowel sounds are normal.     Palpations: Abdomen is soft.  Musculoskeletal:     Right lower leg: No edema.     Left lower leg: No edema.  Lymphadenopathy:     Cervical: No cervical adenopathy.  Skin:    General: Skin is warm and dry.  Neurological:     General: No focal deficit present.     Mental Status: He is alert.  Psychiatric:        Mood and Affect: Mood normal.        Behavior: Behavior normal.        Thought Content: Thought content normal.        Judgment: Judgment normal.     CBC    Component Value Date/Time   WBC 6.7 06/17/2020 0417   RBC 3.42 (L) 06/17/2020 0417   HGB 9.9 (L) 06/17/2020 0417   HCT 32.3 (L) 06/17/2020 0417   PLT 196 06/17/2020 0417   MCV 94.4 06/17/2020 0417   MCH 28.9 06/17/2020 0417   MCHC 30.7 06/17/2020 0417   RDW 18.4 (H) 06/17/2020 0417   LYMPHSABS 0.9 06/13/2020 1410   MONOABS 0.3 06/13/2020 1410   EOSABS 0.2 06/13/2020 1410   BASOSABS 0.1 06/13/2020 1410     Chest imaging: CXR 06/13/20 Increased pulmonary edema and RIGHT pleural effusion/basilar atelectasis since prior exam.  CXR 06/26/20 Near complete resolution of bilateral airspace opacities/edema from the prior study.  Trace bilateral  pleural effusions and mild bibasilar atelectasis.  PFT: No flowsheet data found.  Echo 06/14/20: 1. Global hypokinesis with akinesis of the inferior wall; overall  moderate to severe LV dysfunction.  2. Left ventricular ejection fraction, by estimation, is 30 to 35%. The  left ventricle has moderate to severely decreased function. The left  ventricle demonstrates regional wall motion abnormalities (see scoring  diagram/findings for description). The  left ventricular internal cavity size was mildly dilated. Left ventricular  diastolic parameters are consistent with Grade II diastolic dysfunction  (pseudonormalization). Elevated left atrial pressure.  3. Right ventricular systolic function is normal. The right ventricular  size is normal.  4. Left atrial size was moderately dilated.  5. The mitral valve is normal in structure. Mild mitral valve  regurgitation. No evidence of mitral stenosis.  6. The aortic valve is tricuspid. Aortic valve regurgitation is not  visualized. No aortic stenosis is present.  7. The inferior vena cava is normal in size with greater than 50%  respiratory variability, suggesting right atrial pressure of 3 mmHg.     Assessment & Plan:   Pulmonary capillaritis - Plan: DG Chest 2 View  Discussion: Taeshawn Helfman is a 65 year old male never smoker with history of ESRD on HD, heart failure reduced EF and COPD who comes to pulmonary clinic for hospital follow up of hemoptysis.   He had capillaritis in setting of recent pneumonia, volume overload and dual antiplatelet therapy. This has resolved with adequate volume control and month long prednisone taper for treatment of inflammation.   He would benefit from sleep study evaluation to determine if he would benefit from CPAP therapy. We will try to coordinate this with his primary care team at the [facility].   Follow up as needed.   Freda Jackson, MD [facility] Office: [phone]   See Amion for personal pager PCCM on call pager [phone] until 7pm. Please call Elink 7p-7a.  [phone]   Current Outpatient Medications:  .  acidophilus (RISAQUAD) CAPS capsule, Take 1 capsule by mouth daily., Disp: , Rfl:  .  albuterol (PROVENTIL) (2.5 MG/3ML) 0.083% nebulizer solution, Take 2.5 mg by nebulization every 6 (six) hours as needed for wheezing or shortness of breath., Disp: , Rfl:  .  aspirin EC 81 MG tablet, Take 81 mg by mouth daily. Swallow whole., Disp: , Rfl:  .  atorvastatin (LIPITOR) 80 MG tablet, Take 80 mg by mouth daily., Disp: , Rfl:  .  cinacalcet (SENSIPAR) 30 MG tablet, Take 30 mg by mouth daily., Disp: , Rfl:  .  diphenhydrAMINE (BENADRYL) 25 mg capsule, Take 25 mg by mouth every 6 (six) hours as needed for itching., Disp: , Rfl:  .  famotidine (PEPCID) 20 MG tablet, Take 20 mg by mouth every Monday, Wednesday, and Friday., Disp: , Rfl:  .  ferrous sulfate 325 (65 FE) MG tablet, Take 325 mg by mouth daily with breakfast., Disp: , Rfl:  .  glipiZIDE (GLUCOTROL) 5 MG tablet, Take 0.5 tablets (2.5 mg total) by mouth daily before breakfast., Disp: 90 tablet, Rfl: 0 .  guaiFENesin (MUCINEX) 600 MG 12 hr tablet, Take 1,200 mg by mouth daily., Disp: , Rfl:  .  mirtazapine (REMERON) 15 MG tablet, Take 15 mg by mouth at bedtime., Disp: , Rfl:  .  multivitamin (RENA-VIT) TABS tablet, Take 1 tablet by mouth at bedtime., Disp: 180 tablet, Rfl: 0 .  predniSONE (DELTASONE) 5 MG tablet, Take 1 tablet (5 mg total) by mouth daily with breakfast. --> Take 74m daily through 06/02/20, then, --> Take 274mdaily from 06/03/20 to 06/09/20, then, --> Take 1065maily from 06/10/20 to 06/16/20, then, --> Take 5mg90mily from 06/17/20 to 06/23/20., Disp: 40 tablet, Rfl: 0 .  tamsulosin (FLOMAX) 0.4 MG CAPS capsule, Take 0.4 mg by mouth daily., Disp: , Rfl:  .  ticagrelor (BRILINTA) 90 MG TABS tablet, Take 90 mg by mouth 2 (two) times daily., Disp: , Rfl:  .   tiotropium (SPIRIVA) 18 MCG inhalation capsule, Place 18 mcg into inhaler and inhale daily., Disp: , Rfl:

## 2020-06-29 ENCOUNTER — Encounter: Payer: Self-pay | Admitting: Pulmonary Disease

## 2020-06-29 LAB — FUNGUS CULTURE WITH STAIN

## 2020-06-29 LAB — FUNGAL ORGANISM REFLEX

## 2020-06-29 LAB — FUNGUS CULTURE RESULT

## 2020-06-30 ENCOUNTER — Telehealth: Payer: Self-pay | Admitting: Pulmonary Disease

## 2020-06-30 NOTE — Telephone Encounter (Signed)
Patient was seen by JD last Friday. JD wants the patient to have a sleep study done. I called and spoke with April Simmons at [phone] ext: [phone]. She stated that the PCP had to be the ordering provider but for me to call Marveen Reeks at [phone] to get the process started.

## 2020-07-02 ENCOUNTER — Telehealth: Payer: Self-pay | Admitting: Pulmonary Disease

## 2020-07-02 MED ORDER — DOXYCYCLINE HYCLATE 100 MG PO TABS: 100.0000 mg | ORAL_TABLET | Freq: Two times a day (BID) | ORAL | 0 refills | Status: AC

## 2020-07-02 MED ORDER — TIOTROPIUM BROMIDE MONOHYDRATE 18 MCG IN CAPS: 18.0000 ug | ORAL_CAPSULE | Freq: Every day | RESPIRATORY_TRACT | 6 refills | Status: AC

## 2020-07-02 MED ORDER — PREDNISONE 20 MG PO TABS: 20.0000 mg | ORAL_TABLET | Freq: Every day | ORAL | 0 refills | Status: AC

## 2020-07-02 NOTE — Telephone Encounter (Signed)
Primary Pulmonologist: Dr. Erin Fulling Last office visit and with whom: 06/26/20 with Dr. Erin Fulling What do we see them for (pulmonary problems): pulmonary capillaritis Last OV assessment/plan:  Assessment & Plan:   Pulmonary capillaritis - Plan: DG Chest 2 View  Discussion: Brandon Chaney is a 65 year old male never smoker with history of ESRD on HD, heart failure reduced EF and COPD who comes to pulmonary clinic for hospital follow up of hemoptysis.   He had capillaritis in setting of recent pneumonia, volume overload and dual antiplatelet therapy. This has resolved with adequate volume control and month long prednisone taper for treatment of inflammation.   He would benefit from sleep study evaluation to determine if he would benefit from CPAP therapy. We will try to coordinate this with his primary care team at the [facility].   Follow up as needed.   Was appointment offered to patient (explain)?  Pt wants recommendations   Reason for call: Called and spoke with both pt and wife Brandon Chaney who states pt has had complaints of a cough and wheezing x4-5 days. Pt's cough has been productive. After taking mucinex, pt has been able to cough up thick white phlegm.  Pt denies any complaints of fever. States when he has been going to dialysis his temp has been running around 98.0.  Pt does have O2 that he wears at 3L prn and said due to his symptoms he has had to wear it some due to having increased SOB.  Had pt check O2 sats while I was on the phone with them and his sats were ranging from 90-95% on room air.  Pt states that he has done at least 2 neb treatments daily to see if it would help with his symptoms.  Pt and wife Brandon Chaney want recommendations to see if it would help with pt's symptoms. Due to Dr. Erin Fulling being out of the office sending to provider of the day. Tammy, please advise.    Allergies  Allergen Reactions  . Duragesic-100 [Fentanyl] Nausea Only  . Sulfa  Antibiotics     Heart flutters/itching    Immunization History  Administered Date(s) Administered  . Pneumococcal Polysaccharide-23 05/30/2020

## 2020-07-02 NOTE — Telephone Encounter (Signed)
Spoke with patient/wife, provided recommendations per Elio Forget NP, they verbalized understanding.  Nothing further needed.  Unable to locate pharmacy in system to send electronically :  Scripts for prednisone, doxycycline and spiriva fax[facility] [place], [place] [address] [phone] Fax:  [phone]

## 2020-07-02 NOTE — Telephone Encounter (Signed)
Please make sure that he does not have hemoptysis- if this is positive will need to go to ER .   If neg: can proceed with the below instructions .   Recent hospitalizations x2.  Patient had pneumonia earlier last month.  Has underlying COPD and multiple comorbidities high risk for decompensation  Recommend doxycycline 100 mg twice daily, take with food.  Wear sunscreen if outside  Mucinex twice daily as needed for cough and congestion.  Continue on oxygen to maintain a O2 sats greater than 88 to 90%. Albuterol nebs as needed Continue on Spiriva daily  Prednisone 20 mg daily for 5 days.and stop.    If patient is not improving or worsens will need sooner follow-up or emergency room care  Please contact office for sooner follow up if symptoms do not improve or worsen or seek emergency care

## 2020-07-03 LAB — ACID FAST CULTURE WITH REFLEXED SENSITIVITIES (MYCOBACTERIA): Acid Fast Culture: NEGATIVE

## 2020-07-06 LAB — ACID FAST CULTURE WITH REFLEXED SENSITIVITIES (MYCOBACTERIA): Acid Fast Culture: NEGATIVE

## 2020-07-09 NOTE — Telephone Encounter (Signed)
Called Brandon Chaney at [phone]. She did not answer, left message for her to call back.

## 2020-09-01 ENCOUNTER — Telehealth: Payer: Self-pay | Admitting: Pulmonary Disease

## 2020-09-01 NOTE — Telephone Encounter (Signed)
Called and spoke with patient letting him know that [place] was calling and asking for [place] to fax him his OV notes from 4/8, he gave his permission to fax them. Called and spoke with Selena from [place] and she confirmed fax number. OV notes have been faxed over to preferred number. Nothing further needed at this time.

## 2021-06-19 DEATH — deceased

## 2021-11-03 IMAGING — CT CT CHEST W/O CM
2 of 4 series · 15 of 36 positions shown, 18 images · non-contrast
Comparison: Plain film from 05/20/2020

CLINICAL DATA: Follow-up pneumonia

EXAM:
CT CHEST WITHOUT CONTRAST
TECHNIQUE: Multidetector CT imaging of the chest was performed following the
standard protocol without IV contrast.

[Series 3: chest wo · axial · 0.69mm/px · z∈[-236,+20]mm · 12 of 152 slices shown, 15 images]
[im 12/152  mediastinal]
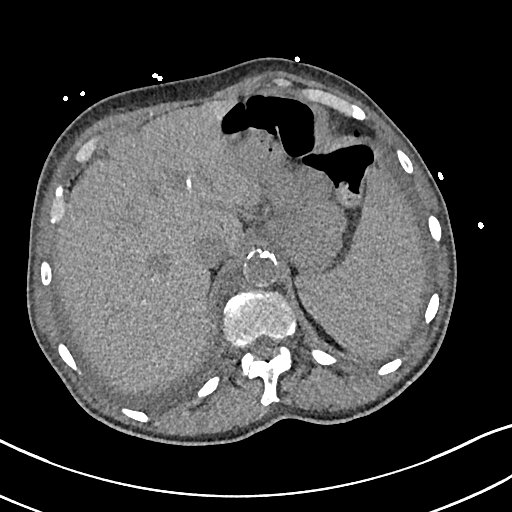
[im 12/152  lung]
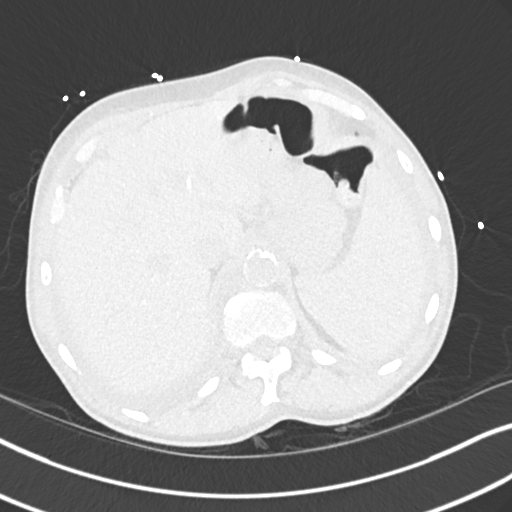
[im 24/152  lung]
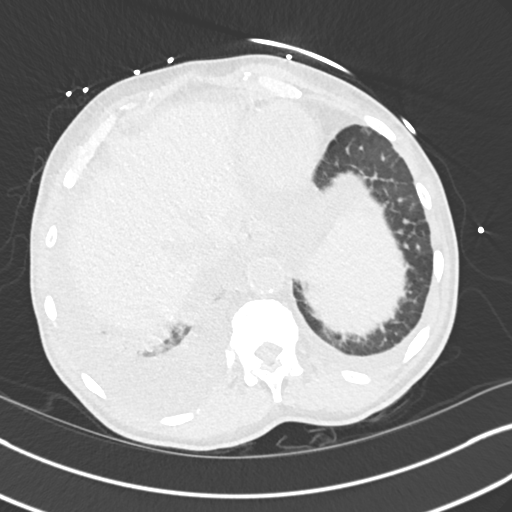
[im 35/152  lung]
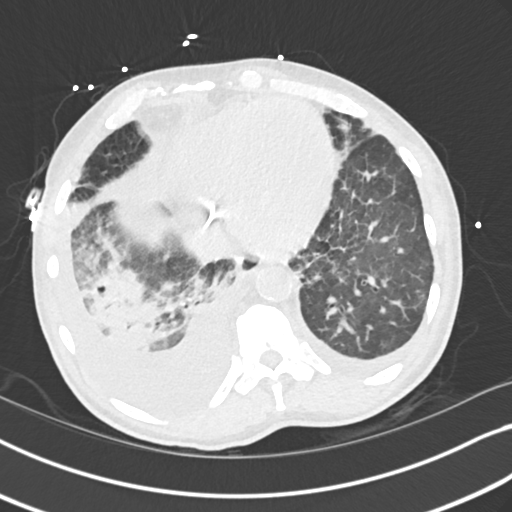
[im 47/152  lung]
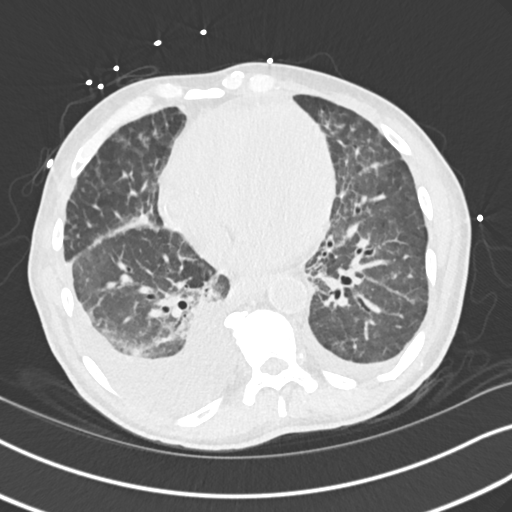
[im 59/152  mediastinal]
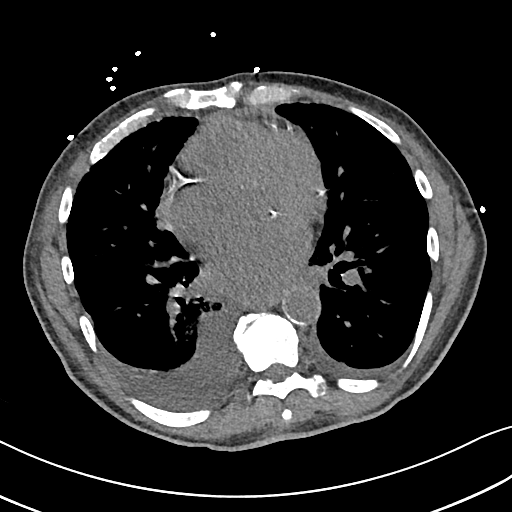
[im 59/152  lung]
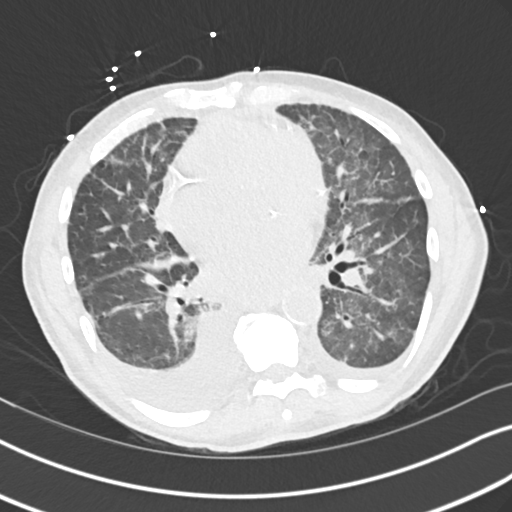
[im 70/152  lung]
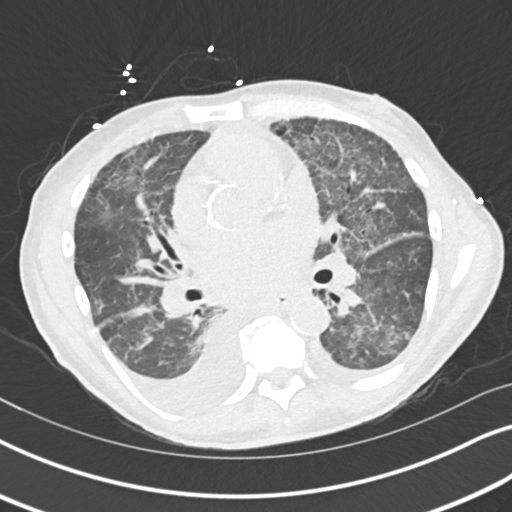
[im 82/152  lung]
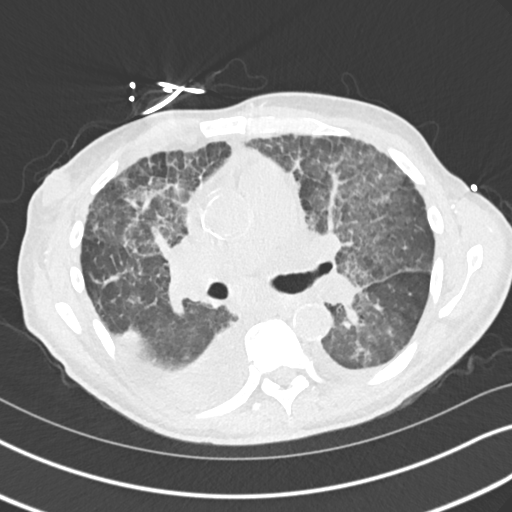
[im 93/152  lung]
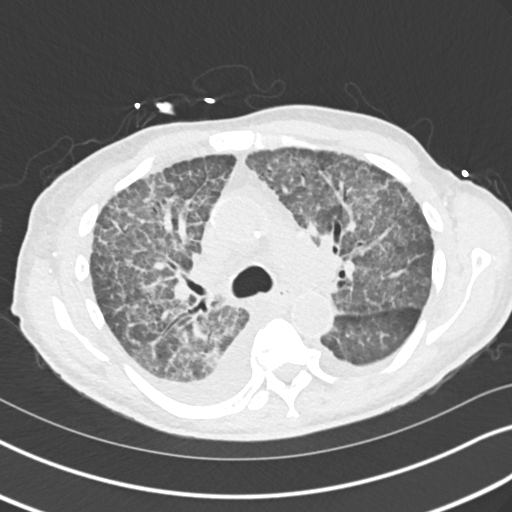
[im 105/152  mediastinal]
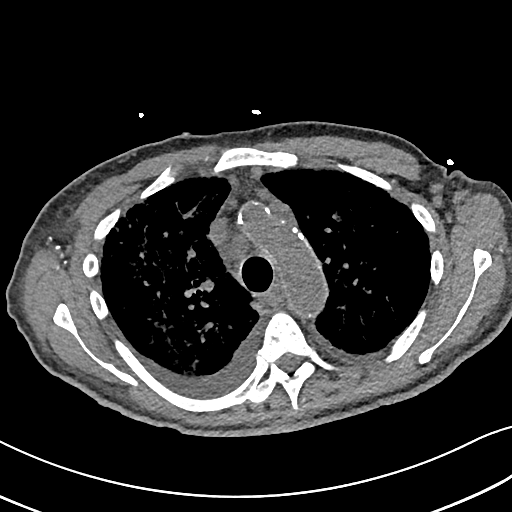
[im 105/152  lung]
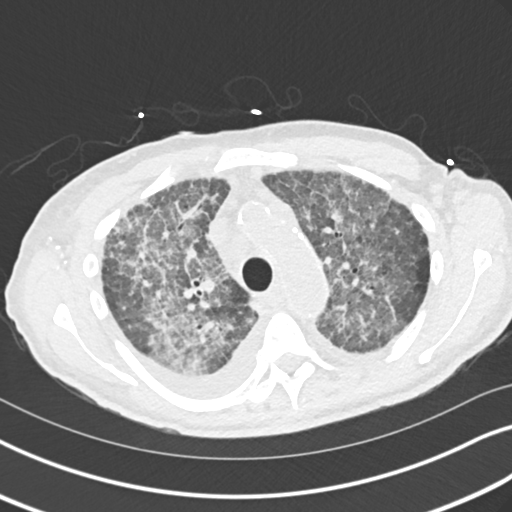
[im 117/152  lung]
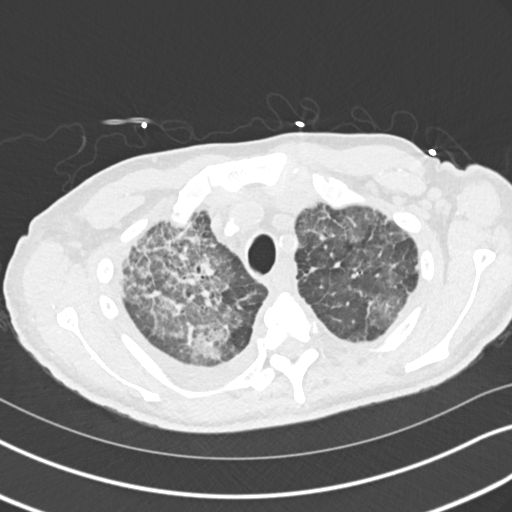
[im 128/152  lung]
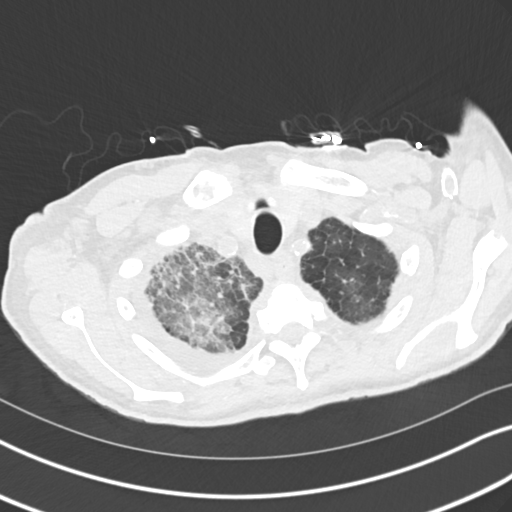
[im 140/152  lung]
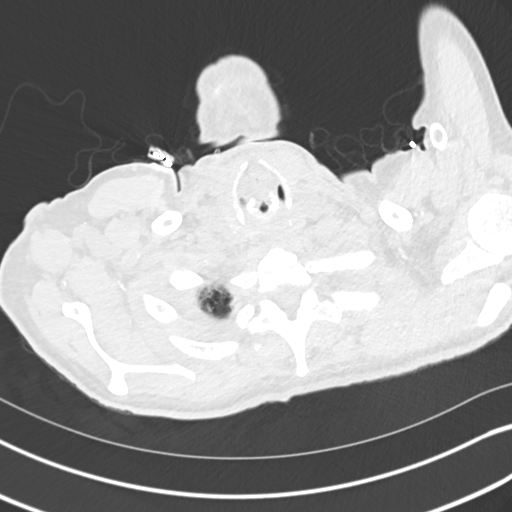

[Series 6: cor · coronal · 0.62mm/px · 3 of 141 slices shown]
[im 29/141  lung]
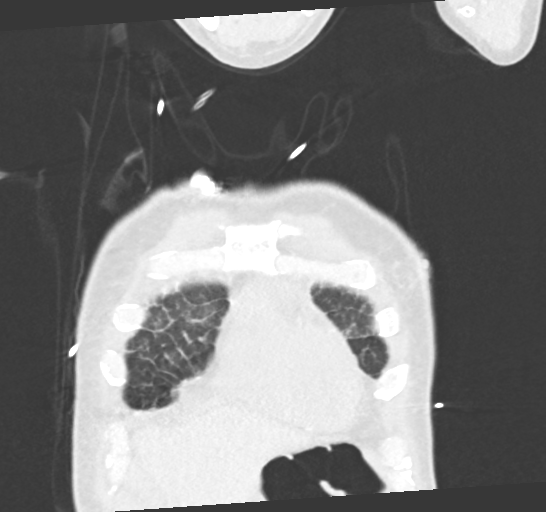
[im 57/141  lung]
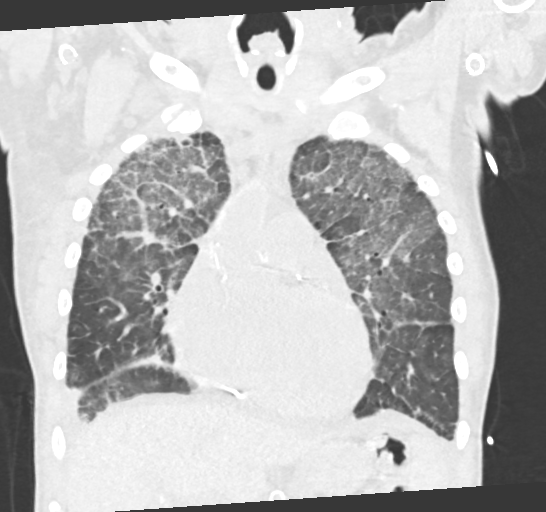
[im 85/141  lung]
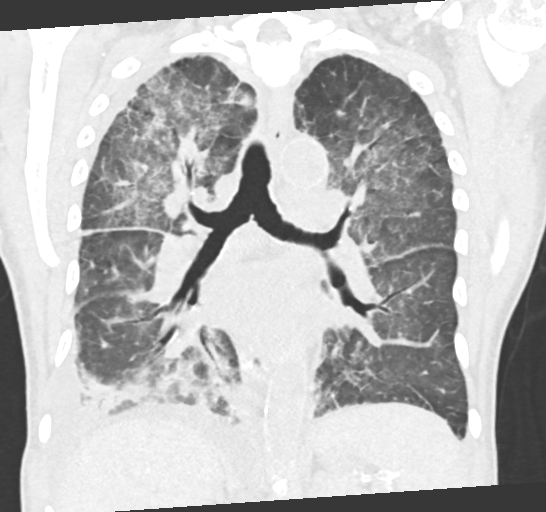

[15 of 36 positions shown; findings below may reference images not displayed]

FINDINGS: Cardiovascular: Somewhat limited due to lack of IV contrast. Mild
decreased attenuation of the cardiac blood pool is noted consistent
with underlying anemia. Diffuse coronary and aortic calcifications
are noted. No aneurysmal dilatation is seen. Vascular stenting is
noted in the right arm.

Mediastinum/Nodes: Thoracic inlet is within normal limits. Scattered
small hilar and mediastinal lymph nodes are seen likely reactive in
nature.

Lungs/Pleura: Bilateral pleural effusions are noted right
considerably greater than left. Right lower lobe consolidation is
noted consistent with acute pneumonia. Additionally changes of
central vascular congestion and edema are noted similar to that seen
on prior plain film examination. This may be related to a degree of
volume overload.

Upper Abdomen: Visualized upper abdomen shows no acute abnormality.

Musculoskeletal: No acute bony abnormality is noted. Degenerative
changes of the thoracic spine are seen. Fusion of T2 and T3 is noted
likely of a congenital nature.
IMPRESSION: Right lower lobe pneumonia.

Bilateral pleural effusions and findings suggestive of CHF similar
to that noted on prior plain film examination.

Aortic Atherosclerosis ([id number]).

## 2021-12-08 IMAGING — DX DG CHEST 2V
2 series · 2 of 2 positions shown · non-contrast
Comparison: 06/13/2020 and prior studies

CLINICAL DATA: Hemoptysis

EXAM:
CHEST - 2 VIEW

[chest pa]
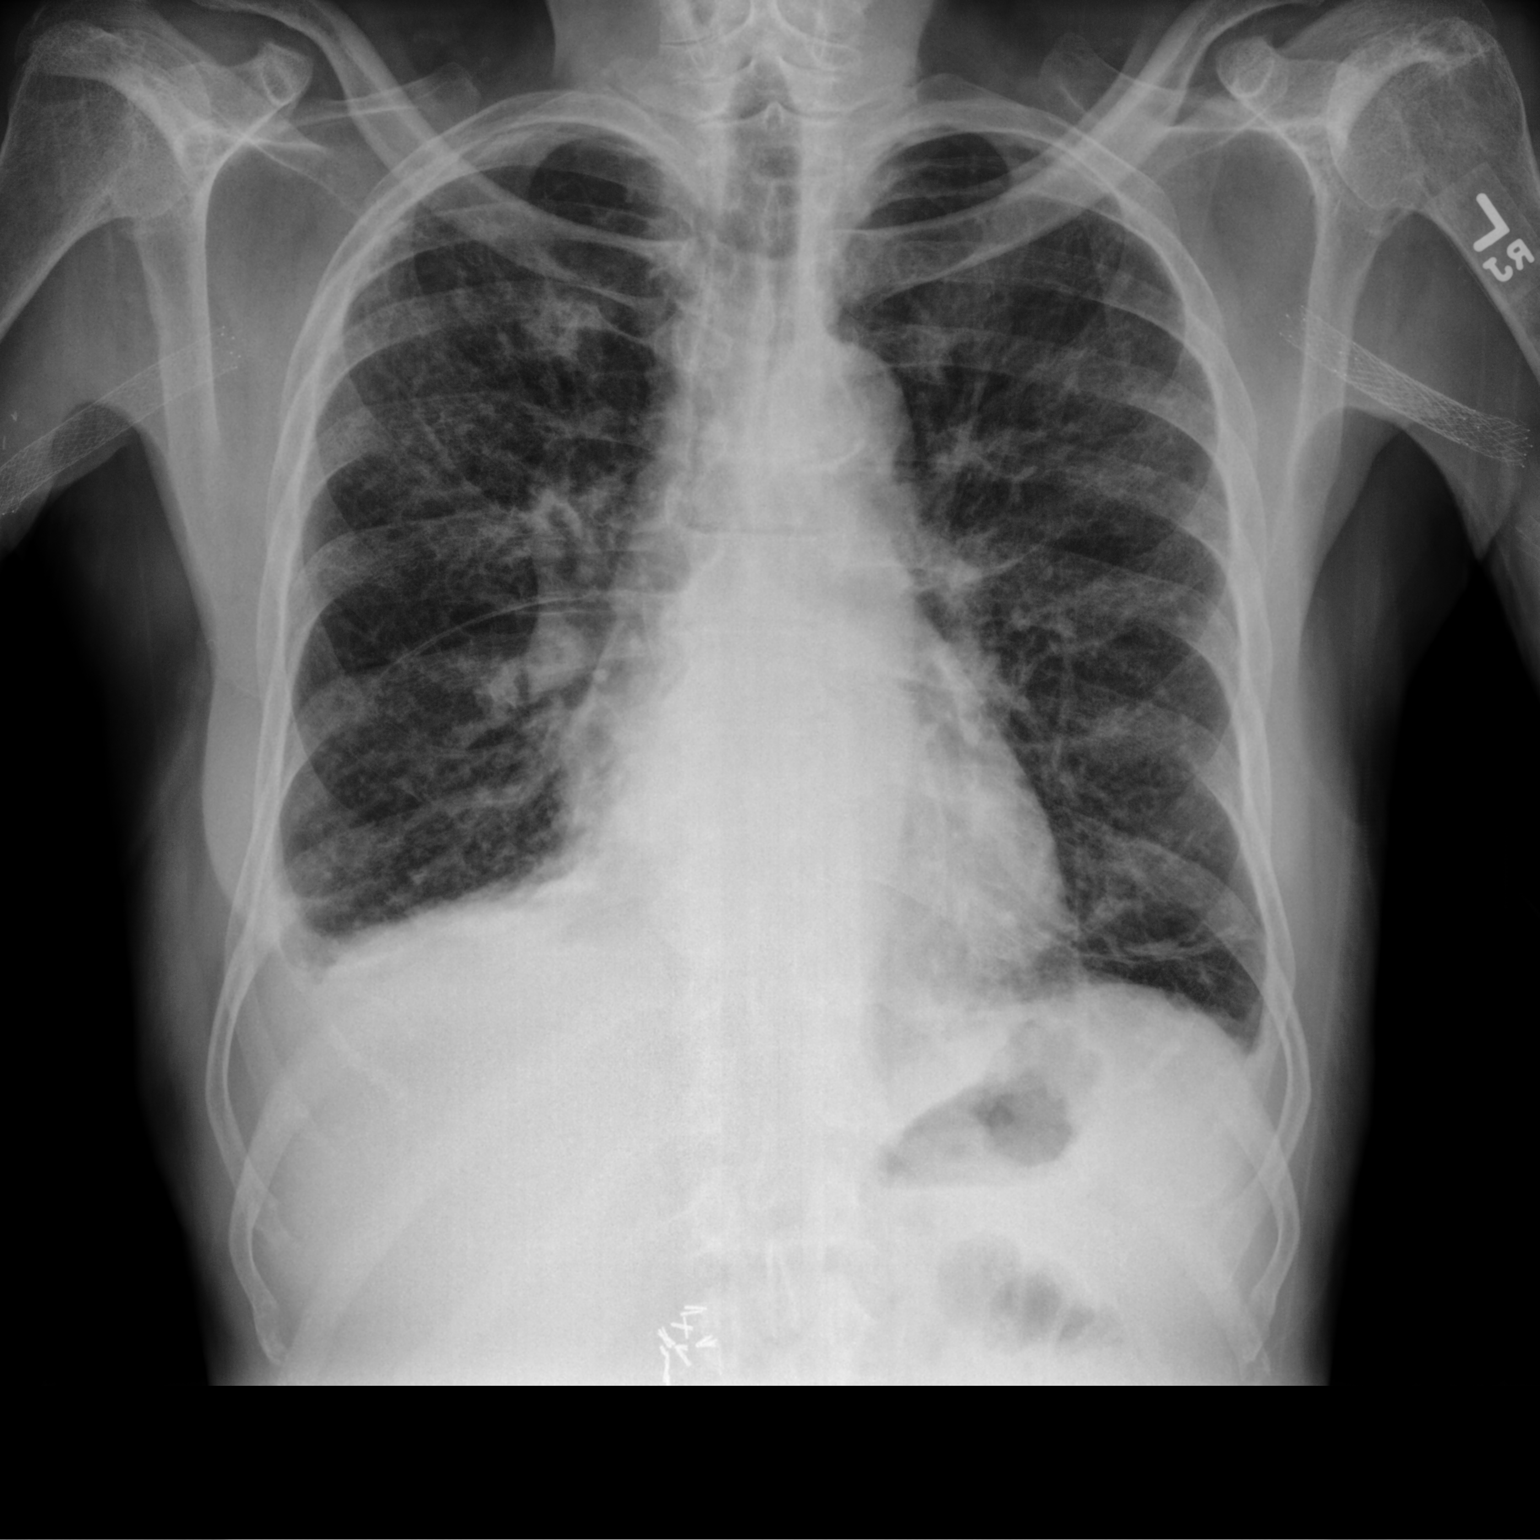

[chest lat]
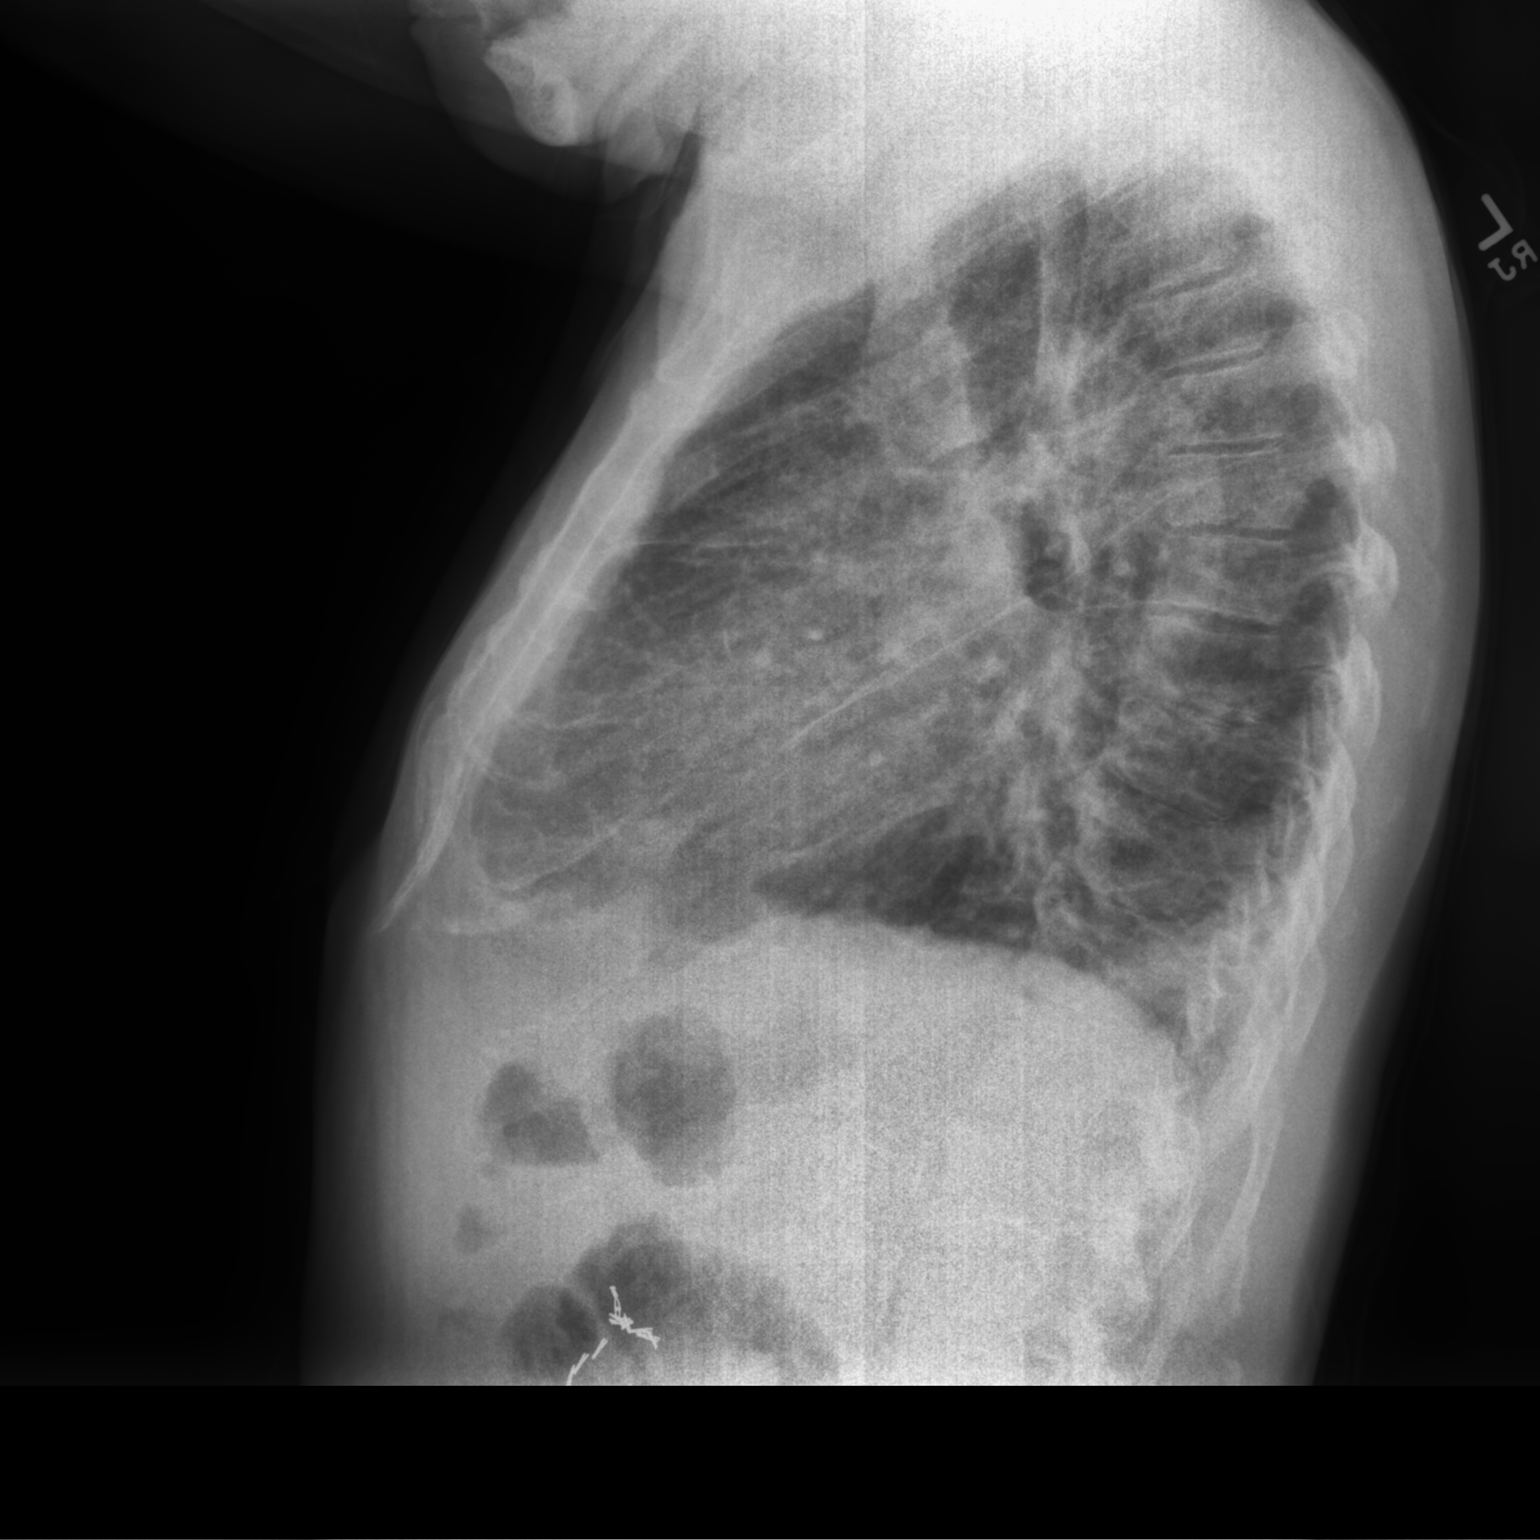

[2 of 2 positions shown; findings below may reference images not displayed]

FINDINGS: The cardiomediastinal silhouette is unremarkable.

Trace bilateral pleural effusions and minimal bibasilar atelectasis
noted.

Near complete resolution of bilateral airspace opacities/edema from
the prior study.

No pneumothorax.

Bilateral axillary vascular stents are noted.
IMPRESSION: Near complete resolution of bilateral airspace opacities/edema from
the prior study.

Trace bilateral pleural effusions and mild bibasilar atelectasis.
